# Patient Record
Sex: Female | Born: 1995 | Race: White | Hispanic: No | Marital: Single | Smoking: Current every day smoker
Health system: Southern US, Community
[De-identification: ages and names within clinical notes are randomized; demographics above are authoritative.]

## PROBLEM LIST (undated history)

## (undated) DIAGNOSIS — F32A Depression, unspecified: Secondary | ICD-10-CM

## (undated) DIAGNOSIS — F909 Attention-deficit hyperactivity disorder, unspecified type: Secondary | ICD-10-CM

## (undated) DIAGNOSIS — O1413 Severe pre-eclampsia, third trimester: Secondary | ICD-10-CM

## (undated) DIAGNOSIS — F329 Major depressive disorder, single episode, unspecified: Secondary | ICD-10-CM

## (undated) HISTORY — DX: Major depressive disorder, single episode, unspecified: F32.9

## (undated) HISTORY — DX: Depression, unspecified: F32.A

## (undated) HISTORY — DX: Severe pre-eclampsia, third trimester: O14.13

## (undated) HISTORY — PX: NO PAST SURGERIES: SHX2092

---
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
## 2017-01-15 ENCOUNTER — Encounter: Payer: Self-pay | Admitting: Certified Nurse Midwife

## 2017-01-15 ENCOUNTER — Encounter: Payer: Medicaid Other | Admitting: Certified Nurse Midwife

## 2017-01-15 ENCOUNTER — Ambulatory Visit (INDEPENDENT_AMBULATORY_CARE_PROVIDER_SITE_OTHER): Payer: Medicaid Other | Admitting: Certified Nurse Midwife

## 2017-01-15 VITALS — BP 130/80 | HR 112 | Ht 65.0 in | Wt 170.0 lb

## 2017-01-15 DIAGNOSIS — Z3169 Encounter for other general counseling and advice on procreation: Secondary | ICD-10-CM

## 2017-01-15 DIAGNOSIS — Z8759 Personal history of other complications of pregnancy, childbirth and the puerperium: Secondary | ICD-10-CM | POA: Diagnosis not present

## 2017-01-15 DIAGNOSIS — Z720 Tobacco use: Secondary | ICD-10-CM

## 2017-01-15 DIAGNOSIS — Z3202 Encounter for pregnancy test, result negative: Secondary | ICD-10-CM | POA: Diagnosis not present

## 2017-01-15 DIAGNOSIS — N926 Irregular menstruation, unspecified: Secondary | ICD-10-CM

## 2017-01-15 LAB — POCT URINE PREGNANCY: Preg Test, Ur: NEGATIVE

## 2017-01-15 MED ORDER — PROVIDA DHA 16-16-1.25-110 MG PO CAPS: 1.0000 | ORAL_CAPSULE | Freq: Every day | ORAL | 11 refills | Status: DC

## 2017-01-15 NOTE — Progress Notes (Signed)
Obstetrics & Gynecology Office Visit   Chief Complaint:  Chief Complaint  Patient presents with  . Follow-up    History of Present Illness: History of Present Illness: 21 year old G1 P1000 (baby died from SIDS at 52 weeks of age) presented initially because she had missed a menses in August and thought she may be pregnant. She had not done a UPT when missing her menses, just assumed she was pregnant and came into start prenatal care. She started a menses 01/11/2017 and it lasted 3 days. Her pregnancy test today was negative. She has not been taking vitamins or using birth control since her Nexplanon was removed in February 2018 for metrorrhagia. She is smoking 2-3 packs of cigarettes/day. She is currently being treated for a tooth abscess and ADHD. Her pediatrician is still listed as her PCP on her Medicaid card ([place] Access) and he has been prescribing her Concerta. She also complains of headaches. She has a history of migraine headaches since she was in elementary school.    She was seen 26 February with concerns for PPD and metrorrhagia on Nexplanon. The Nexplanon was inserted at [facility] after she had her baby in Sept 2017. She had HELLP/ severe preeclampsia.  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 She was given a RX for wellbutrin for the depression with hopes that it might also help her stop smoking, and encouraged to see a counselor at [facility] or hospice, but she never took this medicine or saw a Veterinary surgeon. She also has had an elevated alkaline phosphatase and ALT with some RUQ pain and she did not go for the abdominal ultrasound that was ordered to rule out gallbladder disease. I wanted to send her to GI, but that referral needs to go thru her PCP. I recommended that she change her PCp and follow up with that provider for further evaluation of all these problems, but she has not done that either.  She also uses marijuana.    Review of Systems:  Review of Systems    HENT:       Positive for Dental pain.  Genitourinary:       Positive for irregular menses  Neurological: Positive for headaches.  Psychiatric/Behavioral: Positive for depression.     Past Medical History:  Past Medical History:  Diagnosis Date  . ADHD (attention deficit hyperactivity disorder)   . Depression   . Marijuana use   . Preeclampsia, severe, third trimester    HELLP Syndrome  . Tobacco dependence     Past Surgical History:  Past Surgical History:  Procedure Laterality Date  . NO PAST SURGERIES      Gynecologic History: Patient's last menstrual period was 01/12/2017 (exact date).  Obstetric History:  OB History  Gravida Para Term Preterm AB Living  SAB TAB Ectopic Multiple Live Births          1    # Outcome Date GA Lbr Len/2nd Weight Sex Delivery Anes PTL Lv  1 Term 02/02/16 [redacted]w[redacted]d  7 lb 5 oz (3.317 kg) M   N DEC     Complications: Preeclampsia, severe, third trimester      Family History:  Family History  Problem Relation Age of Onset  . SIDS Son     Social History:  Social History   Social History  . Marital status: Single    Spouse name: N/A  .  Number of children: 0  . Years of education: N/A   Occupational History  . Not on file.   Social History Main Topics  . Smoking status: Current Every Day Smoker    Packs/day: 2.50    Types: Cigarettes  . Smokeless tobacco: Never Used  . Alcohol use Yes  . Drug use: Yes    Types: Marijuana  . Sexual activity: Yes    Birth control/ protection: None   Other Topics Concern  . Not on file   Social History Narrative  . No narrative on file    Allergies:  No Known Allergies  Medications:? Concerta, Amoxicillin  Physical Exam Vitals:BP 130/80   Pulse (!) 112   Ht 5\' 5"  (1.651 m)   Wt 170 lb (77.1 kg)   LMP 01/12/2017 (Exact Date)   BMI 28.29 kg/m  Patient's last menstrual period was 01/12/2017 (exact date).  Physical Exam  Constitutional: She is oriented to person,  place, and time. She appears well-developed and well-nourished. No distress.  Respiratory: Effort normal.  Neurological: She is alert and oriented to person, place, and time.  Psychiatric:  Answering questions appropriately Good eye contact Judgement impaired Behavior normal   ICON negative   Assessment: 21 y.o. G1P1000, presented for prenatal care, but is not pregnant HX of severe preeclampsia Hx of liver enzyme elevation Tobacco abuse Hx of SIDS in son   Plan: Preconceptual counseling. Recommended: 1) Taking prenatal vitamins. Given samples for [facility] and a RX x 1 year 2) Changing PCP on Medicaid card 3) Making appointment with PCP to discuss health concerns: headaches, liver enzyme elevation, tobacco abuse. 4) Tobacco cessation to decrease risks during pregnancy (preterm labor, IUGR, bleeding) and to decrease risk of SIDS. DID not want help earlier this year with Wellbutrin. Discussed help line: 1-800-Quit NOW.  Farrel Connersolleen Ranesha Val, CNM

## 2017-03-10 NOTE — Progress Notes (Signed)
This encounter was created in error - please disregard.

## 2017-03-29 ENCOUNTER — Other Ambulatory Visit: Payer: Self-pay

## 2017-03-29 DIAGNOSIS — O99331 Smoking (tobacco) complicating pregnancy, first trimester: Secondary | ICD-10-CM | POA: Diagnosis not present

## 2017-03-29 DIAGNOSIS — R1032 Left lower quadrant pain: Secondary | ICD-10-CM | POA: Insufficient documentation

## 2017-03-29 DIAGNOSIS — O2311 Infections of bladder in pregnancy, first trimester: Secondary | ICD-10-CM | POA: Insufficient documentation

## 2017-03-29 DIAGNOSIS — O9989 Other specified diseases and conditions complicating pregnancy, childbirth and the puerperium: Secondary | ICD-10-CM | POA: Diagnosis not present

## 2017-03-29 DIAGNOSIS — F17210 Nicotine dependence, cigarettes, uncomplicated: Secondary | ICD-10-CM | POA: Diagnosis not present

## 2017-03-29 DIAGNOSIS — Z3A01 Less than 8 weeks gestation of pregnancy: Secondary | ICD-10-CM | POA: Diagnosis not present

## 2017-03-29 DIAGNOSIS — Z79899 Other long term (current) drug therapy: Secondary | ICD-10-CM | POA: Diagnosis not present

## 2017-03-29 LAB — URINALYSIS, COMPLETE (UACMP) WITH MICROSCOPIC
Bilirubin Urine: NEGATIVE
Glucose, UA: NEGATIVE mg/dL
HGB URINE DIPSTICK: NEGATIVE
Ketones, ur: NEGATIVE mg/dL
NITRITE: NEGATIVE
PROTEIN: 30 mg/dL — AB
Specific Gravity, Urine: 1.031 — ABNORMAL HIGH (ref 1.005–1.030)
pH: 6 (ref 5.0–8.0)

## 2017-03-29 LAB — COMPREHENSIVE METABOLIC PANEL
ALBUMIN: 4.2 g/dL (ref 3.5–5.0)
ALT: 91 U/L — ABNORMAL HIGH (ref 14–54)
ANION GAP: 9 (ref 5–15)
AST: 58 U/L — AB (ref 15–41)
Alkaline Phosphatase: 155 U/L — ABNORMAL HIGH (ref 38–126)
BUN: 11 mg/dL (ref 6–20)
CHLORIDE: 100 mmol/L — AB (ref 101–111)
CO2: 23 mmol/L (ref 22–32)
Calcium: 9.5 mg/dL (ref 8.9–10.3)
Creatinine, Ser: 0.74 mg/dL (ref 0.44–1.00)
GFR calc Af Amer: >60 mL/min (ref >60)
GFR calc non Af Amer: >60 mL/min (ref >60)
GLUCOSE: 96 mg/dL (ref 65–99)
POTASSIUM: 3.4 mmol/L — AB (ref 3.5–5.1)
SODIUM: 132 mmol/L — AB (ref 135–145)
TOTAL PROTEIN: 7.8 g/dL (ref 6.5–8.1)
Total Bilirubin: 1 mg/dL (ref 0.3–1.2)

## 2017-03-29 LAB — CBC
HEMATOCRIT: 40 % (ref 35.0–47.0)
Hemoglobin: 13.8 g/dL (ref 12.0–16.0)
MCH: 32.2 pg (ref 26.0–34.0)
MCHC: 34.4 g/dL (ref 32.0–36.0)
MCV: 93.5 fL (ref 80.0–100.0)
Platelets: 271 10*3/uL (ref 150–440)
RBC: 4.27 MIL/uL (ref 3.80–5.20)
RDW: 12.7 % (ref 11.5–14.5)
WBC: 15 10*3/uL — ABNORMAL HIGH (ref 3.6–11.0)

## 2017-03-29 LAB — POCT PREGNANCY, URINE: PREG TEST UR: POSITIVE — AB

## 2017-03-29 NOTE — ED Triage Notes (Signed)
Pt reports lower abd pain and left flank pain for the past 3 days and now having decreased urination

## 2017-03-30 ENCOUNTER — Emergency Department: Payer: Medicaid Other

## 2017-03-30 ENCOUNTER — Emergency Department
Admission: EM | Admit: 2017-03-30 | Discharge: 2017-03-30 | Disposition: A | Discharge status: Home / Self Care | Payer: Medicaid Other | Attending: Emergency Medicine | Admitting: Emergency Medicine

## 2017-03-30 DIAGNOSIS — O2311 Infections of bladder in pregnancy, first trimester: Secondary | ICD-10-CM

## 2017-03-30 DIAGNOSIS — O26891 Other specified pregnancy related conditions, first trimester: Secondary | ICD-10-CM

## 2017-03-30 DIAGNOSIS — R109 Unspecified abdominal pain: Secondary | ICD-10-CM

## 2017-03-30 LAB — HCG, QUANTITATIVE, PREGNANCY: HCG, BETA CHAIN, QUANT, S: 213459 m[IU]/mL — AB (ref <5)

## 2017-03-30 MED ORDER — NITROFURANTOIN MACROCRYSTAL 100 MG PO CAPS
100.0000 mg | ORAL_CAPSULE | Freq: Four times a day (QID) | ORAL | 0 refills | Status: AC
Start: 2017-03-30 — End: 2017-04-06

## 2017-03-30 MED ORDER — SODIUM CHLORIDE 0.9 % IV BOLUS (SEPSIS)
1000.0000 mL | Freq: Once | INTRAVENOUS | Status: AC
  Administered 2017-03-30: 1000 mL via INTRAVENOUS

## 2017-03-30 MED ORDER — METOCLOPRAMIDE HCL 10 MG PO TABS: 10.0000 mg | ORAL_TABLET | Freq: Three times a day (TID) | ORAL | 0 refills | Status: DC | PRN

## 2017-03-30 NOTE — ED Notes (Signed)
Pt ambulatory with steady gait to have vital signs rechecked; pt reports left mid back pain continues; updated on the wait time/delay; verbalized understanding; pt still with elevated HR, 114; will speak with provider about further orders;

## 2017-03-30 NOTE — ED Notes (Signed)
Spoke with Dr Dolores FrameSung about pt's presenting symptoms and elevated HR; verbal orders obtained

## 2017-03-30 NOTE — Discharge Instructions (Signed)
Please follow up with OB/GYN.

## 2017-03-30 NOTE — ED Provider Notes (Signed)
[facility] Emergency Department Provider Note   ____________________________________________   First MD Initiated Contact with Patient 03/30/17 0216     (approximate)  I have reviewed the triage vital signs and the nursing notes.   HISTORY  Chief Complaint Flank Pain    HPI Susan [name] is a 21 y.o. female who comes into the hospital today because she thought she had a UTI.  She has had some pain in her lower abdomen and in her left lower back.  She is also noticed some orange colored urine.  She has had the symptoms for the past 3 days.  The patient states that she came in tonight because she was so uncomfortable and she could hardly urinate.  The patient states that she took Tylenol today.  She has had some nausea and vomiting as well.  It has been going on for a while.  The patient states that her last menstrual period was a month ago.  She took a pregnancy test yesterday and it was positive.  She denies any vaginal bleeding.  She is a G2 P1 000 as she lost her first child to SIDS.  Patient states that her pain is a 7-8 out of 10 in intensity currently.  She is here for evaluation.   Past Medical History:  Diagnosis Date  . ADHD (attention deficit hyperactivity disorder)   . Depression   . Marijuana use   . Preeclampsia, severe, third trimester    HELLP Syndrome  . Tobacco dependence     Patient Active Problem List   Diagnosis Date Noted  . Tobacco abuse 12/07/2016  . Marijuana abuse 12/07/2016  . Abnormal liver function tests 09/18/2016  . HELLP (hemolytic anemia/elev liver enzymes/low platelets in pregnancy) 09/18/2016  . Chronic headaches 09/18/2016    Past Surgical History:  Procedure Laterality Date  . NO PAST SURGERIES      Prior to Admission medications   Medication Sig Start Date End Date Taking? Authorizing Provider  metoCLOPramide (REGLAN) 10 MG tablet Take 1 tablet (10 mg total) every 8 (eight) hours as needed by mouth.  03/30/17   Rebecka Apley, MD  nitrofurantoin (MACRODANTIN) 100 MG capsule Take 1 capsule (100 mg total) 4 (four) times daily for 7 days by mouth. 03/30/17 04/06/17  Rebecka Apley, MD  Prenat-FeFum-FePo-FA-DHA w/o A (PROVIDA DHA) 16-16-1.25-110 MG CAPS Take 1 capsule by mouth daily. 01/15/17   Farrel Conners, CNM    Allergies Patient has no known allergies.  Family History  Problem Relation Age of Onset  . SIDS Son     Social History Social History   Tobacco Use  . Smoking status: Current Every Day Smoker    Packs/day: 2.50    Types: Cigarettes  . Smokeless tobacco: Never Used  Substance Use Topics  . Alcohol use: Yes  . Drug use: Yes    Types: Marijuana    Review of Systems  Constitutional: No fever/chills Eyes: No visual changes. ENT: No sore throat. Cardiovascular: Denies chest pain. Respiratory: Denies shortness of breath. Gastrointestinal: abdominal pain.   nausea, vomiting.  No diarrhea.  No constipation. Genitourinary:  dysuria. Musculoskeletal:  back pain. Skin: Negative for rash. Neurological: Negative for headaches, focal weakness or numbness.   ____________________________________________   PHYSICAL EXAM:  VITAL SIGNS: ED Triage Vitals  Enc Vitals Group     BP 03/29/17 2153 (!) 161/69     Pulse Rate 03/29/17 2153 (!) 120     Resp 03/29/17 2153 17  Temp 03/29/17 2153 98.1 F (36.7 C)     Temp Source 03/29/17 2153 Oral     SpO2 03/29/17 2153 98 %     Weight 03/29/17 2156 185 lb (83.9 kg)     Height 03/29/17 2156 5\' 6"  (1.676 m)     Head Circumference --      Peak Flow --      Pain Score 03/29/17 2201 7     Pain Loc --      Pain Edu? --      Excl. in GC? --     Constitutional: Alert and oriented. Well appearing and in mild distress. Eyes: Conjunctivae are normal. PERRL. EOMI. Head: Atraumatic. Nose: No congestion/rhinnorhea. Mouth/Throat: Mucous membranes are moist.  Oropharynx non-erythematous. ardiovascular: Normal rate,  regular rhythm. Grossly normal heart sounds.  Good peripheral circulation. Respiratory: Normal respiratory effort.  No retractions. Lungs CTAB. Gastrointestinal: Soft and nontender. No distention.  Positive bowel sounds no distinct CVA tenderness to palpation Genitourinary: patient declined Musculoskeletal: No lower extremity tenderness nor edema.   Neurologic:  Normal speech and language.  Skin:  Skin is warm, dry and intact. Psychiatric: Mood and affect are normal.  ____________________________________________   LABS (all labs ordered are listed, but only abnormal results are displayed)  Labs Reviewed  COMPREHENSIVE METABOLIC PANEL - Abnormal; Notable for the following components:      Result Value   Sodium 132 (*)    Potassium 3.4 (*)    Chloride 100 (*)    AST 58 (*)    ALT 91 (*)    Alkaline Phosphatase 155 (*)    All other components within normal limits  CBC - Abnormal; Notable for the following components:   WBC 15.0 (*)    All other components within normal limits  URINALYSIS, COMPLETE (UACMP) WITH MICROSCOPIC - Abnormal; Notable for the following components:   Color, Urine YELLOW (*)    APPearance HAZY (*)    Specific Gravity, Urine 1.031 (*)    Protein, ur 30 (*)    Leukocytes, UA SMALL (*)    Bacteria, UA RARE (*)    Squamous Epithelial / LPF 6-30 (*)    All other components within normal limits  HCG, QUANTITATIVE, PREGNANCY - Abnormal; Notable for the following components:   hCG, Beta Chain, Quant, S 213,459 (*)    All other components within normal limits  POCT PREGNANCY, URINE - Abnormal; Notable for the following components:   Preg Test, Ur POSITIVE (*)    All other components within normal limits  POC URINE PREG, ED   ____________________________________________  EKG  none ____________________________________________  RADIOLOGY  [place] Ob Comp Less 14 Wks  Result Date: 03/30/2017 CLINICAL DATA:  Abdominal pain for 3 days. Left flank pain. Estimated  gestational age by LMP is 5 weeks 0 days. Quantitative beta HCG is 213,459 EXAM: OBSTETRIC <14 WK US AND TRANSVAGINAL OB US TECHNIQUE: Both transabdominal and transvaginal ultrasound examinations were performed for complete evaluation of the gestation as well as the maternal uterus, adnexal regions, and pelvic cul-de-sac. Transvaginal technique was performed to assess early pregnancy. COMPARISON:  None. FINDINGS: Intrauterine gestational sac: A single intrauterine gestational sac is present. Yolk sac:  Yolk sac is present. Embryo:  Fetal pole is present. Cardiac Activity: Fetal cardiac activity is observed. Heart Rate: 168  bpm CRL:  18  mm   8 w   2 d                  [place]  EDC: 11/07/2017 Subchorionic hemorrhage:  None visualized. Maternal uterus/adnexae: Uterus is anteverted. No myometrial mass lesions identified. Both ovaries are visualized and appear normal. No abnormal adnexal masses. No abnormal pelvic fluid collections. IMPRESSION: Single intrauterine pregnancy. Estimated gestational age by crown-rump length is 8 weeks 2 days. No acute complication demonstrated sonographically. Electronically Signed   By: Burman Nieves M.D.   On: 03/30/2017 04:01   US Ob Transvaginal  Result Date: 03/30/2017 CLINICAL DATA:  Abdominal pain for 3 days. Left flank pain. Estimated gestational age by LMP is 5 weeks 0 days. Quantitative beta HCG is 213,459 EXAM: OBSTETRIC <14 WK [place] AND TRANSVAGINAL OB US TECHNIQUE: Both transabdominal and transvaginal ultrasound examinations were performed for complete evaluation of the gestation as well as the maternal uterus, adnexal regions, and pelvic cul-de-sac. Transvaginal technique was performed to assess early pregnancy. COMPARISON:  None. FINDINGS: Intrauterine gestational sac: A single intrauterine gestational sac is present. Yolk sac:  Yolk sac is present. Embryo:  Fetal pole is present. Cardiac Activity: Fetal cardiac activity is observed. Heart Rate: 168  bpm CRL:  18  mm   8 w    2 d                  [place] EDC: 11/07/2017 Subchorionic hemorrhage:  None visualized. Maternal uterus/adnexae: Uterus is anteverted. No myometrial mass lesions identified. Both ovaries are visualized and appear normal. No abnormal adnexal masses. No abnormal pelvic fluid collections. IMPRESSION: Single intrauterine pregnancy. Estimated gestational age by crown-rump length is 8 weeks 2 days. No acute complication demonstrated sonographically. Electronically Signed   By: Burman Nieves M.D.   On: 03/30/2017 04:01   US Renal  Result Date: 03/30/2017 CLINICAL DATA:  21 year old female with left flank pain x3 days. Positive pregnancy test. EXAM: RENAL / URINARY TRACT ULTRASOUND COMPLETE COMPARISON:  None. FINDINGS: Right Kidney: Length: 11.4 cm. Echogenicity within normal limits. No hydronephrosis or echogenic stone. Left Kidney: Length: 10.0 cm. Echogenicity within normal limits. No hydronephrosis or echogenic stone. Bladder: Appears normal for degree of bladder distention. Partially visualized sac within the uterus. Please see report for the pelvic ultrasound. IMPRESSION: Unremarkable renal ultrasound. Electronically Signed   By: Elgie Collard M.D.   On: 03/30/2017 04:03    ____________________________________________   PROCEDURES  Procedure(s) performed: None  Procedures  Critical Care performed: No  ____________________________________________   INITIAL IMPRESSION / ASSESSMENT AND PLAN / ED COURSE  As part of my medical decision making, I reviewed the following data within the electronic MEDICAL RECORD NUMBER Notes from prior ED visits and [place] Controlled Substance Database   This is a 21 year old female who comes into the hospital today with some urinary symptoms.  She has had some pain with urination as well as some dark urine lower abdominal pain and left flank pain.  Patient also reports that she discovered that she was pregnant yesterday.  The patient declined a pelvic exam but we did  send her for an ultrasound.  The patient's ultrasound shows a single intrauterine pregnancy that is about 8 weeks and 2 days at gestational age.  She does not have an ectopic pregnancy.  The patient's blood work is also unremarkable but she does have some bacteria in her urine and some dark appearing urine.  The patient also had a renal ultrasound which did not show any stones given her unilateral pain.  I did give the patient 2 L of normal saline and I will treat her with some nitrofurantoin for her UTI.  She also has some elevation of her liver enzymes but she does need to follow-up with OB/GYN for further evaluation of all of her symptoms.  The patient otherwise has no complaints.  She is feeling well and will be discharged home to follow-up.  She was eating some chips room without any vomiting.      ____________________________________________   FINAL CLINICAL IMPRESSION(S) / ED DIAGNOSES  Final diagnoses:  Left flank pain  Bladder infection in mother during first trimester of pregnancy  Abdominal pain during pregnancy in first trimester     ED Discharge Orders        Ordered    nitrofurantoin (MACRODANTIN) 100 MG capsule  4 times daily     03/30/17 0540    metoCLOPramide (REGLAN) 10 MG tablet  Every 8 hours PRN     03/30/17 0540       Note:  This document was prepared using Dragon voice recognition software and may include unintentional dictation errors.    Rebecka ApleyWebster, Allison P, MD 03/30/17 [phone]

## 2017-04-06 ENCOUNTER — Ambulatory Visit (INDEPENDENT_AMBULATORY_CARE_PROVIDER_SITE_OTHER): Payer: Medicaid Other | Admitting: Obstetrics & Gynecology

## 2017-04-06 ENCOUNTER — Encounter: Payer: Self-pay | Admitting: Obstetrics & Gynecology

## 2017-04-06 ENCOUNTER — Telehealth: Payer: Self-pay

## 2017-04-06 VITALS — BP 120/80 | Wt 172.0 lb

## 2017-04-06 DIAGNOSIS — Z8759 Personal history of other complications of pregnancy, childbirth and the puerperium: Secondary | ICD-10-CM | POA: Insufficient documentation

## 2017-04-06 DIAGNOSIS — Z3490 Encounter for supervision of normal pregnancy, unspecified, unspecified trimester: Secondary | ICD-10-CM

## 2017-04-06 DIAGNOSIS — O0990 Supervision of high risk pregnancy, unspecified, unspecified trimester: Secondary | ICD-10-CM | POA: Insufficient documentation

## 2017-04-06 DIAGNOSIS — F1210 Cannabis abuse, uncomplicated: Secondary | ICD-10-CM

## 2017-04-06 DIAGNOSIS — Z3A09 9 weeks gestation of pregnancy: Secondary | ICD-10-CM

## 2017-04-06 NOTE — Patient Instructions (Signed)
First Trimester of Pregnancy The first trimester of pregnancy is from week 1 until the end of week 13 (months 1 through 3). A week after a sperm fertilizes an egg, the egg will implant on the wall of the uterus. This embryo will begin to develop into a baby. Genes from you and your partner will form the baby. The female genes will determine whether the baby will be a boy or a girl. At 6-8 weeks, the eyes and face will be formed, and the heartbeat can be seen on ultrasound. At the end of 12 weeks, all the baby's organs will be formed. Now that you are pregnant, you will want to do everything you can to have a healthy baby. Two of the most important things are to get good prenatal care and to follow your health care provider's instructions. Prenatal care is all the medical care you receive before the baby's birth. This care will help prevent, find, and treat any problems during the pregnancy and childbirth. Body changes during your first trimester Your body goes through many changes during pregnancy. The changes vary from woman to woman.  You may gain or lose a couple of pounds at first.  You may feel sick to your stomach (nauseous) and you may throw up (vomit). If the vomiting is uncontrollable, call your health care provider.  You may tire easily.  You may develop headaches that can be relieved by medicines. All medicines should be approved by your health care provider.  You may urinate more often. Painful urination may mean you have a bladder infection.  You may develop heartburn as a result of your pregnancy.  You may develop constipation because certain hormones are causing the muscles that push stool through your intestines to slow down.  You may develop hemorrhoids or swollen veins (varicose veins).  Your breasts may begin to grow larger and become tender. Your nipples may stick out more, and the tissue that surrounds them (areola) may become darker.  Your gums may bleed and may be  sensitive to brushing and flossing.  Dark spots or blotches (chloasma, mask of pregnancy) may develop on your face. This will likely fade after the baby is born.  Your menstrual periods will stop.  You may have a loss of appetite.  You may develop cravings for certain kinds of food.  You may have changes in your emotions from day to day, such as being excited to be pregnant or being concerned that something may go wrong with the pregnancy and baby.  You may have more vivid and strange dreams.  You may have changes in your hair. These can include thickening of your hair, rapid growth, and changes in texture. Some women also have hair loss during or after pregnancy, or hair that feels dry or thin. Your hair will most likely return to normal after your baby is born.  What to expect at prenatal visits During a routine prenatal visit:  You will be weighed to make sure you and the baby are growing normally.  Your blood pressure will be taken.  Your abdomen will be measured to track your baby's growth.  The fetal heartbeat will be listened to between weeks 10 and 14 of your pregnancy.  Test results from any previous visits will be discussed.  Your health care provider may ask you:  How you are feeling.  If you are feeling the baby move.  If you have had any abnormal symptoms, such as leaking fluid, bleeding, severe headaches,   or abdominal cramping.  If you are using any tobacco products, including cigarettes, chewing tobacco, and electronic cigarettes.  If you have any questions.  Other tests that may be performed during your first trimester include:  Blood tests to find your blood type and to check for the presence of any previous infections. The tests will also be used to check for low iron levels (anemia) and protein on red blood cells (Rh antibodies). Depending on your risk factors, or if you previously had diabetes during pregnancy, you may have tests to check for high blood  sugar that affects pregnant women (gestational diabetes).  Urine tests to check for infections, diabetes, or protein in the urine.  An ultrasound to confirm the proper growth and development of the baby.  Fetal screens for spinal cord problems (spina bifida) and Down syndrome.  HIV (human immunodeficiency virus) testing. Routine prenatal testing includes screening for HIV, unless you choose not to have this test.  You may need other tests to make sure you and the baby are doing well.  Follow these instructions at home: Medicines  Follow your health care provider's instructions regarding medicine use. Specific medicines may be either safe or unsafe to take during pregnancy.  Take a prenatal vitamin that contains at least 600 micrograms (mcg) of folic acid.  If you develop constipation, try taking a stool softener if your health care provider approves. Eating and drinking  Eat a balanced diet that includes fresh fruits and vegetables, whole grains, good sources of protein such as meat, eggs, or tofu, and low-fat dairy. Your health care provider will help you determine the amount of weight gain that is right for you.  Avoid raw meat and uncooked cheese. These carry germs that can cause birth defects in the baby.  Eating four or five small meals rather than three large meals a day may help relieve nausea and vomiting. If you start to feel nauseous, eating a few soda crackers can be helpful. Drinking liquids between meals, instead of during meals, also seems to help ease nausea and vomiting.  Limit foods that are high in fat and processed sugars, such as fried and sweet foods.  To prevent constipation: ? Eat foods that are high in fiber, such as fresh fruits and vegetables, whole grains, and beans. ? Drink enough fluid to keep your urine clear or pale yellow. Activity  Exercise only as directed by your health care provider. Most women can continue their usual exercise routine during  pregnancy. Try to exercise for 30 minutes at least 5 days a week. Exercising will help you: ? Control your weight. ? Stay in shape. ? Be prepared for labor and delivery.  Experiencing pain or cramping in the lower abdomen or lower back is a good sign that you should stop exercising. Check with your health care provider before continuing with normal exercises.  Try to avoid standing for long periods of time. Move your legs often if you must stand in one place for a long time.  Avoid heavy lifting.  Wear low-heeled shoes and practice good posture.  You may continue to have sex unless your health care provider tells you not to. Relieving pain and discomfort  Wear a good support bra to relieve breast tenderness.  Take warm sitz baths to soothe any pain or discomfort caused by hemorrhoids. Use hemorrhoid cream if your health care provider approves.  Rest with your legs elevated if you have leg cramps or low back pain.  If you develop   varicose veins in your legs, wear support hose. Elevate your feet for 15 minutes, 3-4 times a day. Limit salt in your diet. Prenatal care  Schedule your prenatal visits by the twelfth week of pregnancy. They are usually scheduled monthly at first, then more often in the last 2 months before delivery.  Write down your questions. Take them to your prenatal visits.  Keep all your prenatal visits as told by your health care provider. This is important. Safety  Wear your seat belt at all times when driving.  Make a list of emergency phone numbers, including numbers for family, friends, the hospital, and police and fire departments. General instructions  Ask your health care provider for a referral to a local prenatal education class. Begin classes no later than the beginning of month 6 of your pregnancy.  Ask for help if you have counseling or nutritional needs during pregnancy. Your health care provider can offer advice or refer you to specialists for help  with various needs.  Do not use hot tubs, steam rooms, or saunas.  Do not douche or use tampons or scented sanitary pads.  Do not cross your legs for long periods of time.  Avoid cat litter boxes and soil used by cats. These carry germs that can cause birth defects in the baby and possibly loss of the fetus by miscarriage or stillbirth.  Avoid all smoking, herbs, alcohol, and medicines not prescribed by your health care provider. Chemicals in these products affect the formation and growth of the baby.  Do not use any products that contain nicotine or tobacco, such as cigarettes and e-cigarettes. If you need help quitting, ask your health care provider. You may receive counseling support and other resources to help you quit.  Schedule a dentist appointment. At home, brush your teeth with a soft toothbrush and be gentle when you floss. Contact a health care provider if:  You have dizziness.  You have mild pelvic cramps, pelvic pressure, or nagging pain in the abdominal area.  You have persistent nausea, vomiting, or diarrhea.  You have a bad smelling vaginal discharge.  You have pain when you urinate.  You notice increased swelling in your face, hands, legs, or ankles.  You are exposed to fifth disease or chickenpox.  You are exposed to German measles (rubella) and have never had it. Get help right away if:  You have a fever.  You are leaking fluid from your vagina.  You have spotting or bleeding from your vagina.  You have severe abdominal cramping or pain.  You have rapid weight gain or loss.  You vomit blood or material that looks like coffee grounds.  You develop a severe headache.  You have shortness of breath.  You have any kind of trauma, such as from a fall or a car accident. Summary  The first trimester of pregnancy is from week 1 until the end of week 13 (months 1 through 3).  Your body goes through many changes during pregnancy. The changes vary from  woman to woman.  You will have routine prenatal visits. During those visits, your health care provider will examine you, discuss any test results you may have, and talk with you about how you are feeling. This information is not intended to replace advice given to you by your health care provider. Make sure you discuss any questions you have with your health care provider. Document Released: 04/22/2001 Document Revised: 04/09/2016 Document Reviewed: 04/09/2016 [employer] Interactive Patient Education  2017 [employer]  

## 2017-04-06 NOTE — Progress Notes (Signed)
04/06/2017   Chief Complaint: Missed period  Transfer of Care Patient: no  History of Present Illness: Susan [name] is a 21 y.o. G2P1000 4415w1d based on Patient's last menstrual period was 02/01/2017. with an Estimated Date of Delivery: 11/08/17, with the above CC.   Her periods were: regular periods every 28 days She was using no method when she conceived.  She has Positive signs or symptoms of nausea/vomiting of pregnancy. She has Negative signs or symptoms of miscarriage or preterm labor She identifies Negative Zika risk factors for her and her partner On any different medications around the time she conceived/early pregnancy: No  History of varicella: Yes   ROS: A 12-point review of systems was performed and negative, except as stated in the above HPI.  OBGYN History: As per HPI. OB History  Gravida Para Term Preterm AB Living  2 1 1         SAB TAB Ectopic Multiple Live Births          1    # Outcome Date GA Lbr Len/2nd Weight Sex Delivery Anes PTL Lv  2 Current           1 Term 02/02/16 7142w1d  7 lb 5 oz (3.317 kg) M   N DEC     Complications: Preeclampsia, severe, third trimester      Any issues with any prior pregnancies: HELLP at 40 weeks, IOL, NSVD, later died of SIDS at 7 weeks Any prior children are healthy, doing well, without any problems or issues: no History of pap smears: No. Last pap smear na. Abnormal: not applicable   History of STIs: No   Past Medical History: Past Medical History:  Diagnosis Date  . ADHD (attention deficit hyperactivity disorder)   . Depression   . Marijuana use   . Preeclampsia, severe, third trimester    HELLP Syndrome  . Tobacco dependence     Past Surgical History: Past Surgical History:  Procedure Laterality Date  . NO PAST SURGERIES      Family History:  Family History  Problem Relation Age of Onset  . SIDS Son    She denies any female cancers, bleeding or blood clotting disorders.  She denies any history of mental  retardation, birth defects or genetic disorders in her or the FOB's history  Social History:  Social History   Socioeconomic History  . Marital status: Single    Spouse name: Not on file  . Number of children: 0  . Years of education: Not on file  . Highest education level: Not on file  Social Needs  . Financial resource strain: Not on file  . Food insecurity - worry: Not on file  . Food insecurity - inability: Not on file  . Transportation needs - medical: Not on file  . Transportation needs - non-medical: Not on file  Occupational History  . Not on file  Tobacco Use  . Smoking status: Current Every Day Smoker    Packs/day: 2.50    Types: Cigarettes  . Smokeless tobacco: Never Used  Substance and Sexual Activity  . Alcohol use: Yes  . Drug use: Yes    Types: Marijuana  . Sexual activity: Yes    Birth control/protection: None  Other Topics Concern  . Not on file  Social History Narrative  . Not on file   Any pets in the household: no  Allergy: No Known Allergies  Current Outpatient Medications:  Current Outpatient Medications:  .  metoCLOPramide (REGLAN) 10 MG  tablet, Take 1 tablet (10 mg total) every 8 (eight) hours as needed by mouth., Disp: 20 tablet, Rfl: 0 .  nitrofurantoin (MACRODANTIN) 100 MG capsule, Take 1 capsule (100 mg total) 4 (four) times daily for 7 days by mouth., Disp: 28 capsule, Rfl: 0 .  Prenat-FeFum-FePo-FA-DHA w/o A (PROVIDA DHA) 16-16-1.25-110 MG CAPS, Take 1 capsule by mouth daily., Disp: 30 capsule, Rfl: 11   Physical Exam:   BP 120/80   Wt 172 lb (78 kg)   LMP 02/01/2017 Comment: about a month ago.  has no idea.  home preg tested positive  BMI 27.76 kg/m  Body mass index is 27.76 kg/m. Constitutional: Well nourished, well developed female in no acute distress.  Neck:  Supple, normal appearance, and no thyromegaly  Cardiovascular: S1, S2 normal, no murmur, rub or gallop, regular rate and rhythm Respiratory:  Clear to auscultation  bilateral. Normal respiratory effort Abdomen: positive bowel sounds and no masses, hernias; diffusely non tender to palpation, non distended Breasts: breasts appear normal, no suspicious masses, no skin or nipple changes or axillary nodes. Neuro/Psych:  Normal mood and affect.  Skin:  Warm and dry.  Lymphatic:  No inguinal lymphadenopathy.   Pelvic exam: is not limited by body habitus EGBUS: within normal limits, Vagina: within normal limits and with no blood in the vault, Cervix: normal appearing cervix without discharge or lesions, closed/long/high, Uterus:  enlarged: 8 weeks, and Adnexa:  not evaluated  Assessment: Susan [name] is a 21 y.o. G2P1000 5869w1d based on Patient's last menstrual period was 02/01/2017. with an Estimated Date of Delivery: 11/08/17,  for prenatal care.  Plan:  1) Avoid alcoholic beverages. 2) Patient encouraged not to smoke.  3) Discontinue the use of all non-medicinal drugs and chemicals.  4) Take prenatal vitamins daily.  5) Seatbelt use advised 6) Nutrition, food safety (fish, cheese advisories, and high nitrite foods) and exercise discussed. 7) Hospital and [facility] discussed  8) Patient is asked about travel to areas at risk for the Zika virus, and counseled to avoid travel and exposure to mosquitoes or sexual partners who may have themselves been exposed to the virus. Testing is discussed, and will be ordered as appropriate.  9) [facility]birth [facility] advised 10) Genetic Screening, such as with 1st Trimester Screening, cell free fetal DNA, AFP testing, and Ultrasound, as well as with amniocentesis and CVS as appropriate, is discussed with patient. She plans to have not genetic testing this pregnancy. 11) Baseline PREECLAMPSIA labs done. 12) uDS 13) Sudafed for cold like sx's  Problem list reviewed and updated.  Annamarie MajorPaul Valdez Brannan, MD, Merlinda FrederickFACOG [facility], [facility] 04/06/2017  9:36 AM

## 2017-04-06 NOTE — Telephone Encounter (Signed)
Pt seen this a.m. Since then they have found out that there is head lice in the home. Inquiring what is safe for her to use. Does she need a rx? If so, she doesn't have a pcp. JX#[id number]

## 2017-04-06 NOTE — Telephone Encounter (Signed)
Spoke w/[name]. Notified per protocol that OTC Nix is safe to use. [name] states Misty Stanleythey do not have the funds to purchase. She has medicaid & is requesting [facility] rx [facility] [address]

## 2017-04-07 ENCOUNTER — Other Ambulatory Visit: Payer: Self-pay | Admitting: Obstetrics and Gynecology

## 2017-04-07 LAB — CMP AND LIVER
ALT: 42 IU/L — AB (ref 0–32)
AST: 24 IU/L (ref 0–40)
Albumin: 4.3 g/dL (ref 3.5–5.5)
Alkaline Phosphatase: 160 IU/L — ABNORMAL HIGH (ref 39–117)
BILIRUBIN, DIRECT: 0.08 mg/dL (ref 0.00–0.40)
BUN: 9 mg/dL (ref 6–20)
Bilirubin Total: <0.2 mg/dL (ref 0.0–1.2)
CALCIUM: 9.6 mg/dL (ref 8.7–10.2)
CO2: 20 mmol/L (ref 20–29)
Chloride: 102 mmol/L (ref 96–106)
Creatinine, Ser: 0.62 mg/dL (ref 0.57–1.00)
GFR, EST AFRICAN AMERICAN: 151 mL/min/{1.73_m2} (ref >59)
GFR, EST NON AFRICAN AMERICAN: 131 mL/min/{1.73_m2} (ref >59)
GLUCOSE: 91 mg/dL (ref 65–99)
Potassium: 4.1 mmol/L (ref 3.5–5.2)
Sodium: 137 mmol/L (ref 134–144)
Total Protein: 6.8 g/dL (ref 6.0–8.5)

## 2017-04-07 LAB — RPR+RH+ABO+RUB AB+AB SCR+CB...
ANTIBODY SCREEN: NEGATIVE
HEMATOCRIT: 38.9 % (ref 34.0–46.6)
HIV Screen 4th Generation wRfx: NONREACTIVE
Hemoglobin: 12.4 g/dL (ref 11.1–15.9)
Hepatitis B Surface Ag: NEGATIVE
MCH: 30.7 pg (ref 26.6–33.0)
MCHC: 31.9 g/dL (ref 31.5–35.7)
MCV: 96 fL (ref 79–97)
Platelets: 282 10*3/uL (ref 150–379)
RBC: 4.04 x10E6/uL (ref 3.77–5.28)
RDW: 13.5 % (ref 12.3–15.4)
RH TYPE: POSITIVE
RPR Ser Ql: NONREACTIVE
Rubella Antibodies, IGG: 1.59 index (ref >0.99)
Varicella zoster IgG: <135 index — ABNORMAL LOW (ref >165)
WBC: 13.8 10*3/uL — ABNORMAL HIGH (ref 3.4–10.8)

## 2017-04-07 LAB — PROTEIN / CREATININE RATIO, URINE
Creatinine, Urine: 84 mg/dL
PROTEIN UR: 7.5 mg/dL
PROTEIN/CREAT RATIO: 89 mg/g{creat} (ref 0–200)

## 2017-04-07 MED ORDER — PERMETHRIN 1 % EX LIQD: Freq: Once | CUTANEOUS | 0 refills | Status: DC

## 2017-04-07 NOTE — Telephone Encounter (Signed)
That is pregnancy category C the active ingredient in Nix is B so she really needs to find someway to come up with the money for it

## 2017-04-07 NOTE — Telephone Encounter (Signed)
[facility]

## 2017-04-07 NOTE — Telephone Encounter (Signed)
Susan [name] states that [employer] is covered by [employer]. That is what the pediatrician has sent in for her grandchildren. Requesting this to be sent in.

## 2017-04-07 NOTE — Telephone Encounter (Signed)
Notified [name]. She will p/u the Nix that was sent in.

## 2017-04-08 LAB — DRUG SCREEN, URINE
AMPHETAMINES, URINE: NEGATIVE ng/mL
BARBITURATE SCREEN URINE: NEGATIVE ng/mL
BENZODIAZEPINE QUANT UR: NEGATIVE ng/mL
Cannabinoid Quant, Ur: POSITIVE ng/mL — AB
Cocaine (Metab.): NEGATIVE ng/mL
Opiate Quant, Ur: NEGATIVE ng/mL
PCP Quant, Ur: NEGATIVE ng/mL

## 2017-04-08 LAB — GC/CHLAMYDIA PROBE AMP
CHLAMYDIA, DNA PROBE: NEGATIVE
NEISSERIA GONORRHOEAE BY PCR: NEGATIVE

## 2017-04-08 LAB — URINE CULTURE

## 2017-05-01 ENCOUNTER — Ambulatory Visit (INDEPENDENT_AMBULATORY_CARE_PROVIDER_SITE_OTHER): Payer: Medicaid Other | Admitting: Certified Nurse Midwife

## 2017-05-01 VITALS — BP 110/70 | Wt 176.0 lb

## 2017-05-01 DIAGNOSIS — R7989 Other specified abnormal findings of blood chemistry: Secondary | ICD-10-CM

## 2017-05-01 DIAGNOSIS — Z720 Tobacco use: Secondary | ICD-10-CM

## 2017-05-01 DIAGNOSIS — Z3A12 12 weeks gestation of pregnancy: Secondary | ICD-10-CM

## 2017-05-01 DIAGNOSIS — F1210 Cannabis abuse, uncomplicated: Secondary | ICD-10-CM

## 2017-05-01 DIAGNOSIS — O0990 Supervision of high risk pregnancy, unspecified, unspecified trimester: Secondary | ICD-10-CM

## 2017-05-01 DIAGNOSIS — R945 Abnormal results of liver function studies: Secondary | ICD-10-CM

## 2017-05-01 DIAGNOSIS — Z8482 Family history of sudden infant death syndrome: Secondary | ICD-10-CM

## 2017-05-01 MED ORDER — CEPHALEXIN 500 MG PO CAPS: 500.0000 mg | ORAL_CAPSULE | Freq: Two times a day (BID) | ORAL | 0 refills | Status: DC

## 2017-05-01 MED ORDER — PROVIDA OB 20-20-1.25 MG PO CAPS: 1.0000 | ORAL_CAPSULE | Freq: Every day | ORAL | 11 refills | Status: AC

## 2017-05-01 MED ORDER — DOXYLAMINE-PYRIDOXINE 10-10 MG PO TBEC: 2.0000 | DELAYED_RELEASE_TABLET | Freq: Every day | ORAL | 3 refills | Status: DC

## 2017-05-01 NOTE — Progress Notes (Signed)
Pt reports no problems.   

## 2017-05-01 NOTE — Progress Notes (Signed)
HROB at 12wk5d-history of severe preeclampsia/ HELLP/ first baby died of SIDS Smoking MJ and tobacco. States has cut down smoking: smokes MJ in AM to help with nausea and smokes 2-3 cigs/day Discussed possible effects of MJ and tobacco on fetal growth and development and recommended stopping both RX for Diclegis called into pharmacy.  Patient also complains of a sore boil on her mons/right panty line 3 cm boil present-not draining-may have started as a folliculitis (shaved mons) RX for Keflex 500 mgm BID/ recommend warm soaks BID Reviewed NOB labs-LFTs improved, ALT only slightly elevated-can repeat at 28 weeks. Encouraged hydration. Drinks 2L of caffeinated soda a day and no water Recommended slowly decreasing caffeine and increasing water Declines genetic testing ROB in 4 weeks

## 2017-05-06 ENCOUNTER — Encounter: Payer: Medicaid Other | Admitting: Obstetrics and Gynecology

## 2017-05-12 NOTE — L&D Delivery Note (Signed)
Delivery Note Primary OB: [facility] Provider: Marcelyn BruinsJacelyn Timothey Dahlstrom, CNM Gestational Age: Full term Antepartum complications: none Intrapartum complications: None  A viable Female was delivered via vertex presentation. No nuchal cord was present. Delivery of the shoulders and body followed without difficulty. A body cord was present. The infant was placed on the maternal abdomen. The umbilical cord was doubly clamped and cut following delayed cord clamping. Cord blood was collected. The placenta was delivered spontaneously and was inspected and found to be intact with a three vessel cord.  IM Pitocin was given because IV access was lost during the third stage. The cervix and vagina were inspected. A first degree perineal laceration was repaired with 3-0 Vicryl. The fundus was firm. Patient and infant were bonding in stable condition. All counts were correct.  Apgars:8, 9  Weight:  7 lb 8 oz.   Placenta status: spontaneous and intact.    Anesthesia:  nitrous oxide Episiotomy:  none Lacerations:  1st Suture Repair: 3.0 Vicryl Est. Blood Loss (mL):  300 mL  Mom to postpartum.  Baby to Couplet care / Skin to Skin.  Marcelyn BruinsJacelyn Tonica Brasington, CNM [facility], [facility] 11/03/2017  9:22 AM

## 2017-05-29 ENCOUNTER — Ambulatory Visit (INDEPENDENT_AMBULATORY_CARE_PROVIDER_SITE_OTHER): Payer: Medicaid Other | Admitting: Certified Nurse Midwife

## 2017-05-29 VITALS — BP 112/62 | Wt 187.0 lb

## 2017-05-29 DIAGNOSIS — Z3A16 16 weeks gestation of pregnancy: Secondary | ICD-10-CM

## 2017-05-29 DIAGNOSIS — O0990 Supervision of high risk pregnancy, unspecified, unspecified trimester: Secondary | ICD-10-CM

## 2017-05-29 MED ORDER — ONDANSETRON 4 MG PO TBDP: 4.0000 mg | ORAL_TABLET | Freq: Four times a day (QID) | ORAL | 1 refills | Status: DC | PRN

## 2017-05-29 NOTE — Progress Notes (Signed)
HROB at 16wk5d: history of severe preeclampsia/ HELLP/ first baby died of SIDS Has decreased her tobacco use to 2 cigs/day. HAs gained 11# in the last 4 weeks. Complains of persistent N/V in the AM. Not taking Diclegis at night, tries to take in AM, and it does not stay down. A family member gave her Zofran ODT and that worked. She requests a RX for Zofran ODT. Also complains of sacral back pain; it does not radiate down buttock or leg, but can extend to the left side of sacral area. Recommend heat/ice/ Biofreeze to sacrum and pelvic rocks Feeling FM Exam: FHTs 156. BP 112/62 Well groomed today and engaging in conversation A: IUP at 16wk5d Sacral back pain P: Anatomy scan and ROB in 3-4 weeks Zofran 4 mgm ODT #30/RF x1 -take one q6 hours prn

## 2017-05-29 NOTE — Progress Notes (Signed)
Pt c/o lower back pain and stiffness in same area when she previously had epidural.

## 2017-06-26 ENCOUNTER — Ambulatory Visit (INDEPENDENT_AMBULATORY_CARE_PROVIDER_SITE_OTHER): Payer: Medicaid Other

## 2017-06-26 ENCOUNTER — Ambulatory Visit (INDEPENDENT_AMBULATORY_CARE_PROVIDER_SITE_OTHER): Payer: Medicaid Other | Admitting: Obstetrics & Gynecology

## 2017-06-26 VITALS — BP 130/80 | Wt 188.0 lb

## 2017-06-26 DIAGNOSIS — Z8759 Personal history of other complications of pregnancy, childbirth and the puerperium: Secondary | ICD-10-CM

## 2017-06-26 DIAGNOSIS — Z3A16 16 weeks gestation of pregnancy: Secondary | ICD-10-CM | POA: Diagnosis not present

## 2017-06-26 DIAGNOSIS — O0990 Supervision of high risk pregnancy, unspecified, unspecified trimester: Secondary | ICD-10-CM

## 2017-06-26 DIAGNOSIS — Z3A20 20 weeks gestation of pregnancy: Secondary | ICD-10-CM

## 2017-06-26 MED ORDER — CONCEPT DHA 53.5-38-1 MG PO CAPS: 1.0000 | ORAL_CAPSULE | Freq: Every day | ORAL | 11 refills | Status: AC

## 2017-06-26 NOTE — Progress Notes (Signed)
  Subjective  Fetal Movement? yes Contractions? no Leaking Fluid? no Vaginal Bleeding? no  Objective  BP 130/80   Wt 188 lb (85.3 kg)   LMP 02/01/2017 Comment: about a month ago.  has no idea.  home preg tested positive  BMI 30.34 kg/m  General: NAD Pumonary: no increased work of breathing Abdomen: gravid, non-tender Extremities: no edema Psychiatric: mood appropriate, affect full  Assessment  22 y.o. G2P1000 at 2154w5d by  11/08/2017, by Last Menstrual Period presenting for routine prenatal visit  Plan   Problem List Items Addressed This Visit      Other   Supervision of high risk pregnancy, antepartum   History of pre-eclampsia    Other Visit Diagnoses    [redacted] weeks gestation of pregnancy    -  Primary    Review of ULTRASOUND.    I have personally reviewed images and report of [facility].    Plan of management to be discussed with patient.  Annamarie MajorPaul Jesus Poplin, MD, Merlinda FrederickFACOG [facility], [facility] 06/26/2017  2:33 PM

## 2017-06-26 NOTE — Patient Instructions (Signed)

## 2017-07-07 ENCOUNTER — Other Ambulatory Visit: Payer: Self-pay | Admitting: Certified Nurse Midwife

## 2017-07-15 ENCOUNTER — Telehealth: Payer: Self-pay

## 2017-07-15 MED ORDER — DOCUSATE SODIUM 100 MG PO CAPS: 100.0000 mg | ORAL_CAPSULE | Freq: Two times a day (BID) | ORAL | 2 refills | Status: DC | PRN

## 2017-07-15 NOTE — Telephone Encounter (Signed)
Colace eRx

## 2017-07-15 NOTE — Telephone Encounter (Signed)
Pt aware rx has been sent to pharmacy

## 2017-07-15 NOTE — Telephone Encounter (Signed)
Pt c/o constipation - what can she take?  [phone]  Called to adv metamucil, fibercon, citricil, colace, fresh fruits/vegetables, 64oz water a day.  Pt's mom states they need rx called in that mcaid will pay for as they don't have the money to purchase otc items - thinks colace can be rx'd.  Adv I would ask.

## 2017-07-24 ENCOUNTER — Ambulatory Visit (INDEPENDENT_AMBULATORY_CARE_PROVIDER_SITE_OTHER): Payer: Medicaid Other | Admitting: Maternal Newborn

## 2017-07-24 ENCOUNTER — Encounter: Payer: Self-pay | Admitting: Maternal Newborn

## 2017-07-24 VITALS — BP 130/78 | Wt 192.0 lb

## 2017-07-24 DIAGNOSIS — O26899 Other specified pregnancy related conditions, unspecified trimester: Secondary | ICD-10-CM

## 2017-07-24 DIAGNOSIS — R110 Nausea: Secondary | ICD-10-CM

## 2017-07-24 DIAGNOSIS — Z3A25 25 weeks gestation of pregnancy: Secondary | ICD-10-CM

## 2017-07-24 DIAGNOSIS — O0990 Supervision of high risk pregnancy, unspecified, unspecified trimester: Secondary | ICD-10-CM

## 2017-07-24 MED ORDER — ONDANSETRON 4 MG PO TBDP
ORAL_TABLET | ORAL | 0 refills | Status: DC
Start: 2017-07-24 — End: 2017-08-10

## 2017-07-24 NOTE — Progress Notes (Signed)
    Routine Prenatal Care Visit  Subjective  Susan [name] is a 22 y.o. G2P1000 at 2837w5d being seen today for ongoing prenatal care.  She is currently monitored for the following issues for this high-risk pregnancy and has Abnormal liver function tests; HELLP (hemolytic anemia/elev liver enzymes/low platelets in pregnancy); Chronic headaches; Tobacco abuse; Marijuana abuse; Supervision of high risk pregnancy, antepartum; History of pre-eclampsia; and Family history of SIDS (sudden infant death syndrome) on their problem list.  ----------------------------------------------------------------------------------- Patient reports nausea.  Rx for Zofran renewed. Contractions: Not present. Vag. Bleeding: None.  Movement: Present. Denies leaking of fluid.  ----------------------------------------------------------------------------------- The following portions of the patient's history were reviewed and updated as appropriate: allergies, current medications, past family history, past medical history, past social history, past surgical history and problem list. Problem list updated.   Objective  Blood pressure 130/78, weight 192 lb (87.1 kg), last menstrual period 02/01/2017. Pregravid weight 170 lb (77.1 kg) Total Weight Gain 22 lb (9.979 kg) Urinalysis: Urine Protein: Negative Urine Glucose: Negative  Fetal Status: Fetal Heart Rate (bpm): 153 Fundal Height: 24 cm Movement: Present     General:  Alert, oriented and cooperative. Patient is in no acute distress.  Skin: Skin is warm and dry. No rash noted.   Cardiovascular: Normal heart rate noted  Respiratory: Normal respiratory effort, no problems with respiration noted  Abdomen: Soft, gravid, appropriate for gestational age. Pain/Pressure: Absent     Pelvic:  Cervical exam deferred        Extremities: Normal range of motion.     Mental Status: Normal mood and affect. Normal behavior. Normal judgment and thought content.     Assessment    22 y.o. G2P1000 at 537w5d, EDD 11/08/2017 by Last Menstrual Period presenting for routine prenatal visit.  Plan   pregnancy 2 Problems (from 02/02/17 to present)    Problem Noted Resolved   Supervision of high risk pregnancy, antepartum 04/06/2017 by Nadara MustardHarris, Robert P, MD No   Overview Addendum 05/01/2017  5:21 PM by Farrel ConnersGutierrez, Colleen, CNM    Clinic [facility] Prenatal Labs  [facility] Blood type:   O POS  Genetic Screen declines Antibody: negative  Anatomic US  Rubella:   ImmuneVaricella: non immune  GTT Early:               Third trimester:  RPR:   non reactive  Rhogam  HBsAg:   negative  TDaP vaccine                       Flu Shot: HIV:   non reactive  Baby Food                                GBS:   Contraception  Pap:  CBB   ALT 42; AST 24  CS/VBAC  UDS + for MJ  Support Person             Advised that 28 week labs/GTT would be done next visit.   Preterm labor symptoms and general obstetric precautions  were reviewed with the patient.  Return in about 4 weeks (around 08/21/2017) for ROB with GTT/28 week labs.  Susan [name], CNM 07/26/2017  10:37 PM

## 2017-07-24 NOTE — Progress Notes (Signed)
No vb. No lof.  

## 2017-07-29 ENCOUNTER — Telehealth: Payer: Self-pay

## 2017-07-29 NOTE — Telephone Encounter (Signed)
Pt experiencing a really bad cold & red throat. Pt tried to take Zyrtec but it burned her nose & made it feel like it was bleeding. Inquiring what she can take while pregnant. ZO#[id number]#[phone]

## 2017-07-29 NOTE — Telephone Encounter (Signed)
Spoke w/[name]/pt's mom. States pt has stuffy, runny nose w/sore throat. Advised can use Sudafed, Mucinex or claritin and Tylenol for fever/pain. Advised to contact us if s&s not better or get worse. Aware not to use meds for more than 7 days.

## 2017-08-10 ENCOUNTER — Other Ambulatory Visit: Payer: Self-pay | Admitting: Maternal Newborn

## 2017-08-10 DIAGNOSIS — O26899 Other specified pregnancy related conditions, unspecified trimester: Secondary | ICD-10-CM

## 2017-08-10 DIAGNOSIS — R110 Nausea: Principal | ICD-10-CM

## 2017-08-21 ENCOUNTER — Other Ambulatory Visit: Payer: Medicaid Other

## 2017-08-21 ENCOUNTER — Ambulatory Visit (INDEPENDENT_AMBULATORY_CARE_PROVIDER_SITE_OTHER): Payer: Medicaid Other | Admitting: Maternal Newborn

## 2017-08-21 ENCOUNTER — Encounter: Payer: Self-pay | Admitting: Maternal Newborn

## 2017-08-21 VITALS — BP 134/80 | Wt 198.0 lb

## 2017-08-21 DIAGNOSIS — O99331 Smoking (tobacco) complicating pregnancy, first trimester: Secondary | ICD-10-CM | POA: Insufficient documentation

## 2017-08-21 DIAGNOSIS — O0990 Supervision of high risk pregnancy, unspecified, unspecified trimester: Secondary | ICD-10-CM

## 2017-08-21 DIAGNOSIS — Z3A28 28 weeks gestation of pregnancy: Secondary | ICD-10-CM

## 2017-08-21 DIAGNOSIS — O99333 Smoking (tobacco) complicating pregnancy, third trimester: Secondary | ICD-10-CM

## 2017-08-21 DIAGNOSIS — O99332 Smoking (tobacco) complicating pregnancy, second trimester: Secondary | ICD-10-CM | POA: Insufficient documentation

## 2017-08-21 NOTE — Progress Notes (Signed)
C/o left should blade pain - hurts to breathe.rj

## 2017-08-21 NOTE — Patient Instructions (Signed)
Third Trimester of Pregnancy The third trimester is from week 28 through week 40 (months 7 through 9). The third trimester is a time when the unborn baby (fetus) is growing rapidly. At the end of the ninth month, the fetus is about 20 inches in length and weighs 6-10 pounds. Body changes during your third trimester Your body will continue to go through many changes during pregnancy. The changes vary from woman to woman. During the third trimester:  Your weight will continue to increase. You can expect to gain 25-35 pounds (11-16 kg) by the end of the pregnancy.  You may begin to get stretch marks on your hips, abdomen, and breasts.  You may urinate more often because the fetus is moving lower into your pelvis and pressing on your bladder.  You may develop or continue to have heartburn. This is caused by increased hormones that slow down muscles in the digestive tract.  You may develop or continue to have constipation because increased hormones slow digestion and cause the muscles that push waste through your intestines to relax.  You may develop hemorrhoids. These are swollen veins (varicose veins) in the rectum that can itch or be painful.  You may develop swollen, bulging veins (varicose veins) in your legs.  You may have increased body aches in the pelvis, back, or thighs. This is due to weight gain and increased hormones that are relaxing your joints.  You may have changes in your hair. These can include thickening of your hair, rapid growth, and changes in texture. Some women also have hair loss during or after pregnancy, or hair that feels dry or thin. Your hair will most likely return to normal after your baby is born.  Your breasts will continue to grow and they will continue to become tender. A yellow fluid (colostrum) may leak from your breasts. This is the first milk you are producing for your baby.  Your belly button may stick out.  You may notice more swelling in your hands,  face, or ankles.  You may have increased tingling or numbness in your hands, arms, and legs. The skin on your belly may also feel numb.  You may feel short of breath because of your expanding uterus.  You may have more problems sleeping. This can be caused by the size of your belly, increased need to urinate, and an increase in your body's metabolism.  You may notice the fetus "dropping," or moving lower in your abdomen (lightening).  You may have increased vaginal discharge.  You may notice your joints feel loose and you may have pain around your pelvic bone.  What to expect at prenatal visits You will have prenatal exams every 2 weeks until week 36. Then you will have weekly prenatal exams. During a routine prenatal visit:  You will be weighed to make sure you and the baby are growing normally.  Your blood pressure will be taken.  Your abdomen will be measured to track your baby's growth.  The fetal heartbeat will be listened to.  Any test results from the previous visit will be discussed.  You may have a cervical check near your due date to see if your cervix has softened or thinned (effaced).  You will be tested for Group B streptococcus. This happens between 35 and 37 weeks.  Your health care provider may ask you:  What your birth plan is.  How you are feeling.  If you are feeling the baby move.  If you have had   any abnormal symptoms, such as leaking fluid, bleeding, severe headaches, or abdominal cramping.  If you are using any tobacco products, including cigarettes, chewing tobacco, and electronic cigarettes.  If you have any questions.  Other tests or screenings that may be performed during your third trimester include:  Blood tests that check for low iron levels (anemia).  Fetal testing to check the health, activity level, and growth of the fetus. Testing is done if you have certain medical conditions or if there are problems during the  pregnancy.  Nonstress test (NST). This test checks the health of your baby to make sure there are no signs of problems, such as the baby not getting enough oxygen. During this test, a belt is placed around your belly. The baby is made to move, and its heart rate is monitored during movement.  What is false labor? False labor is a condition in which you feel small, irregular tightenings of the muscles in the womb (contractions) that usually go away with rest, changing position, or drinking water. These are called Braxton Hicks contractions. Contractions may last for hours, days, or even weeks before true labor sets in. If contractions come at regular intervals, become more frequent, increase in intensity, or become painful, you should see your health care provider. What are the signs of labor?  Abdominal cramps.  Regular contractions that start at 10 minutes apart and become stronger and more frequent with time.  Contractions that start on the top of the uterus and spread down to the lower abdomen and back.  Increased pelvic pressure and dull back pain.  A watery or bloody mucus discharge that comes from the vagina.  Leaking of amniotic fluid. This is also known as your "water breaking." It could be a slow trickle or a gush. Let your health care provider know if it has a color or strange odor. If you have any of these signs, call your health care provider right away, even if it is before your due date. Follow these instructions at home: Medicines  Follow your health care provider's instructions regarding medicine use. Specific medicines may be either safe or unsafe to take during pregnancy.  Take a prenatal vitamin that contains at least 600 micrograms (mcg) of folic acid.  If you develop constipation, try taking a stool softener if your health care provider approves. Eating and drinking  Eat a balanced diet that includes fresh fruits and vegetables, whole grains, good sources of protein  such as meat, eggs, or tofu, and low-fat dairy. Your health care provider will help you determine the amount of weight gain that is right for you.  Avoid raw meat and uncooked cheese. These carry germs that can cause birth defects in the baby.  If you have low calcium intake from food, talk to your health care provider about whether you should take a daily calcium supplement.  Eat four or five small meals rather than three large meals a day.  Limit foods that are high in fat and processed sugars, such as fried and sweet foods.  To prevent constipation: ? Drink enough fluid to keep your urine clear or pale yellow. ? Eat foods that are high in fiber, such as fresh fruits and vegetables, whole grains, and beans. Activity  Exercise only as directed by your health care provider. Most women can continue their usual exercise routine during pregnancy. Try to exercise for 30 minutes at least 5 days a week. Stop exercising if you experience uterine contractions.  Avoid heavy   lifting.  Do not exercise in extreme heat or humidity, or at high altitudes.  Wear low-heel, comfortable shoes.  Practice good posture.  You may continue to have sex unless your health care provider tells you otherwise. Relieving pain and discomfort  Take frequent breaks and rest with your legs elevated if you have leg cramps or low back pain.  Take warm sitz baths to soothe any pain or discomfort caused by hemorrhoids. Use hemorrhoid cream if your health care provider approves.  Wear a good support bra to prevent discomfort from breast tenderness.  If you develop varicose veins: ? Wear support pantyhose or compression stockings as told by your healthcare provider. ? Elevate your feet for 15 minutes, 3-4 times a day. Prenatal care  Write down your questions. Take them to your prenatal visits.  Keep all your prenatal visits as told by your health care provider. This is important. Safety  Wear your seat belt at  all times when driving.  Make a list of emergency phone numbers, including numbers for family, friends, the hospital, and police and fire departments. General instructions  Avoid cat litter boxes and soil used by cats. These carry germs that can cause birth defects in the baby. If you have a cat, ask someone to clean the litter box for you.  Do not travel far distances unless it is absolutely necessary and only with the approval of your health care provider.  Do not use hot tubs, steam rooms, or saunas.  Do not drink alcohol.  Do not use any products that contain nicotine or tobacco, such as cigarettes and e-cigarettes. If you need help quitting, ask your health care provider.  Do not use any medicinal herbs or unprescribed drugs. These chemicals affect the formation and growth of the baby.  Do not douche or use tampons or scented sanitary pads.  Do not cross your legs for long periods of time.  To prepare for the arrival of your baby: ? Take prenatal classes to understand, practice, and ask questions about labor and delivery. ? Make a trial run to the hospital. ? Visit the hospital and tour the maternity area. ? Arrange for maternity or paternity leave through employers. ? Arrange for family and friends to take care of pets while you are in the hospital. ? Purchase a rear-facing car seat and make sure you know how to install it in your car. ? Pack your hospital bag. ? Prepare the baby's nursery. Make sure to remove all pillows and stuffed animals from the baby's crib to prevent suffocation.  Visit your dentist if you have not gone during your pregnancy. Use a soft toothbrush to brush your teeth and be gentle when you floss. Contact a health care provider if:  You are unsure if you are in labor or if your water has broken.  You become dizzy.  You have mild pelvic cramps, pelvic pressure, or nagging pain in your abdominal area.  You have lower back pain.  You have persistent  nausea, vomiting, or diarrhea.  You have an unusual or bad smelling vaginal discharge.  You have pain when you urinate. Get help right away if:  Your water breaks before 37 weeks.  You have regular contractions less than 5 minutes apart before 37 weeks.  You have a fever.  You are leaking fluid from your vagina.  You have spotting or bleeding from your vagina.  You have severe abdominal pain or cramping.  You have rapid weight loss or weight gain.    You have shortness of breath with chest pain.  You notice sudden or extreme swelling of your face, hands, ankles, feet, or legs.  Your baby makes fewer than 10 movements in 2 hours.  You have severe headaches that do not go away when you take medicine.  You have vision changes. Summary  The third trimester is from week 28 through week 40, months 7 through 9. The third trimester is a time when the unborn baby (fetus) is growing rapidly.  During the third trimester, your discomfort may increase as you and your baby continue to gain weight. You may have abdominal, leg, and back pain, sleeping problems, and an increased need to urinate.  During the third trimester your breasts will keep growing and they will continue to become tender. A yellow fluid (colostrum) may leak from your breasts. This is the first milk you are producing for your baby.  False labor is a condition in which you feel small, irregular tightenings of the muscles in the womb (contractions) that eventually go away. These are called Braxton Hicks contractions. Contractions may last for hours, days, or even weeks before true labor sets in.  Signs of labor can include: abdominal cramps; regular contractions that start at 10 minutes apart and become stronger and more frequent with time; watery or bloody mucus discharge that comes from the vagina; increased pelvic pressure and dull back pain; and leaking of amniotic fluid. This information is not intended to replace advice  given to you by your health care provider. Make sure you discuss any questions you have with your health care provider. Document Released: 04/22/2001 Document Revised: 10/04/2015 Document Reviewed: 06/29/2012 Elsevier Interactive Patient Education  2017 [employer]  

## 2017-08-21 NOTE — Progress Notes (Signed)
Routine Prenatal Care Visit  Subjective  Susan [name] is a 22 y.o. G2P1000 at 3470w5d being seen today for ongoing prenatal care.  She is currently monitored for the following issues for this high-risk pregnancy and has Abnormal liver function tests; HELLP (hemolytic anemia/elev liver enzymes/low platelets in pregnancy); Chronic headaches; Tobacco abuse; Marijuana abuse; Supervision of high risk pregnancy, antepartum; History of pre-eclampsia; and Family history of SIDS (sudden infant death syndrome) on their problem list.  ----------------------------------------------------------------------------------- Patient reports some pain in her right shoulder, worse on inspriation. Also some pain in left hip/lower back. Denies abdominal and epigastric pain..   Contractions: Not present. Vag. Bleeding: None.  Movement: Present. Denies leaking of fluid.  ----------------------------------------------------------------------------------- The following portions of the patient's history were reviewed and updated as appropriate: allergies, current medications, past family history, past medical history, past social history, past surgical history and problem list. Problem list updated.   Objective   Vitals:   08/21/17 1110  BP: 134/80   Last menstrual period 02/01/2017. Pregravid weight 170 lb (77.1 kg) Total Weight Gain 28 lb (12.7 kg) Urinalysis: Urine Protein: Negative Urine Glucose: Negative  Fetal Status: Fetal Heart Rate (bpm): 146 Fundal Height: 30 cm Movement: Present     General:  Alert, oriented and cooperative. Patient is in no acute distress.  Skin: Skin is warm and dry. No rash noted.   Cardiovascular: Normal heart rate noted  Respiratory: Normal respiratory effort, no problems with respiration noted  Abdomen: Soft, gravid, appropriate for gestational age. Pain/Pressure: Absent     Pelvic:  Cervical exam deferred        Extremities: Normal range of motion.     Mental Status:  Normal mood and affect. Normal behavior. Normal judgment and thought content.     Assessment   22 y.o. G2P1000 at 3270w5d, EDD 11/08/2017 by Last Menstrual Period presenting for routine prenatal visit.  Plan   pregnancy 2 Problems (from 02/02/17 to present)    Problem Noted Resolved   Supervision of high risk pregnancy, antepartum 04/06/2017 by Nadara MustardHarris, Robert P, MD No   Overview Addendum 05/01/2017  5:21 PM by Farrel ConnersGutierrez, Colleen, CNM    [facility] Prenatal Labs  Dating US at 8 weeks at [facility] Blood type:   O POS  Genetic Screen declines Antibody: negative  Anatomic US  Rubella:   ImmuneVaricella: non immune  GTT Early:               Third trimester:  RPR:   non reactive  Rhogam  HBsAg:   negative  TDaP vaccine                       Flu Shot: HIV:   non reactive  Baby Food                                GBS:   Contraception  Pap:  CBB   ALT 42; AST 24  CS/VBAC  UDS + for MJ  Support Person             Shoulder pain likely from muscle strain, high up on shoulder blade. Discussed comfort measures for back and hip pain. Patient admits smoking 5-6 cigarettes daily. Advised cessation. Offered Quitline literature; patient refused and said she will cut down on smoking. Advised about effects of smoking on pregnancy.  GTT/28 week labs today.  Preterm labor symptoms and general obstetric precautions  including but not limited to vaginal bleeding, contractions, leaking of fluid and fetal movement were reviewed in detail with the patient.  Please refer to After Visit Summary for other counseling recommendations.   Return in about 2 weeks (around 09/04/2017) for ROB.  Marcelyn Bruins, CNM 08/21/2017  1:36 PM

## 2017-08-22 LAB — 28 WEEK RH+PANEL
BASOS: 0 %
Basophils Absolute: 0 10*3/uL (ref 0.0–0.2)
EOS (ABSOLUTE): 0.1 10*3/uL (ref 0.0–0.4)
Eos: 1 %
GESTATIONAL DIABETES SCREEN: 167 mg/dL — AB (ref 65–139)
HIV Screen 4th Generation wRfx: NONREACTIVE
Hematocrit: 34.6 % (ref 34.0–46.6)
Hemoglobin: 11.8 g/dL (ref 11.1–15.9)
IMMATURE GRANULOCYTES: 0 %
Immature Grans (Abs): 0 10*3/uL (ref 0.0–0.1)
Lymphocytes Absolute: 2.6 10*3/uL (ref 0.7–3.1)
Lymphs: 15 %
MCH: 32.4 pg (ref 26.6–33.0)
MCHC: 34.1 g/dL (ref 31.5–35.7)
MCV: 95 fL (ref 79–97)
MONOS ABS: 0.9 10*3/uL (ref 0.1–0.9)
Monocytes: 5 %
NEUTROS PCT: 79 %
Neutrophils Absolute: 13.4 10*3/uL — ABNORMAL HIGH (ref 1.4–7.0)
PLATELETS: 283 10*3/uL (ref 150–379)
RBC: 3.64 x10E6/uL — AB (ref 3.77–5.28)
RDW: 13.2 % (ref 12.3–15.4)
RPR: NONREACTIVE
WBC: 17.1 10*3/uL — ABNORMAL HIGH (ref 3.4–10.8)

## 2017-08-22 LAB — COMPREHENSIVE METABOLIC PANEL
A/G RATIO: 1.5 (ref 1.2–2.2)
ALT: 29 IU/L (ref 0–32)
AST: 21 IU/L (ref 0–40)
Albumin: 3.8 g/dL (ref 3.5–5.5)
Alkaline Phosphatase: 145 IU/L — ABNORMAL HIGH (ref 39–117)
BUN/Creatinine Ratio: 13 (ref 9–23)
BUN: 7 mg/dL (ref 6–20)
Bilirubin Total: 0.2 mg/dL (ref 0.0–1.2)
CALCIUM: 8.8 mg/dL (ref 8.7–10.2)
CO2: 18 mmol/L — ABNORMAL LOW (ref 20–29)
Chloride: 101 mmol/L (ref 96–106)
Creatinine, Ser: 0.53 mg/dL — ABNORMAL LOW (ref 0.57–1.00)
GFR calc Af Amer: 158 mL/min/{1.73_m2} (ref >59)
GFR calc non Af Amer: 137 mL/min/{1.73_m2} (ref >59)
GLUCOSE: 156 mg/dL — AB (ref 65–99)
Globulin, Total: 2.5 g/dL (ref 1.5–4.5)
POTASSIUM: 3.6 mmol/L (ref 3.5–5.2)
Sodium: 137 mmol/L (ref 134–144)
Total Protein: 6.3 g/dL (ref 6.0–8.5)

## 2017-08-24 ENCOUNTER — Other Ambulatory Visit: Payer: Self-pay | Admitting: Maternal Newborn

## 2017-08-24 ENCOUNTER — Encounter: Payer: Self-pay | Admitting: Maternal Newborn

## 2017-08-24 DIAGNOSIS — R7309 Other abnormal glucose: Secondary | ICD-10-CM | POA: Insufficient documentation

## 2017-09-01 ENCOUNTER — Other Ambulatory Visit: Payer: Medicaid Other

## 2017-09-01 ENCOUNTER — Other Ambulatory Visit: Payer: Self-pay | Admitting: Maternal Newborn

## 2017-09-01 DIAGNOSIS — R7309 Other abnormal glucose: Secondary | ICD-10-CM

## 2017-09-01 DIAGNOSIS — R110 Nausea: Principal | ICD-10-CM

## 2017-09-01 DIAGNOSIS — O26899 Other specified pregnancy related conditions, unspecified trimester: Secondary | ICD-10-CM

## 2017-09-01 NOTE — Telephone Encounter (Signed)
Pt mother calling requesting refill on pt Zofran. Pt has been seeing JS. She is out of office. Can you please get CG to refill this please?

## 2017-09-01 NOTE — Telephone Encounter (Signed)
Pt's last note did not mention any nausea. May want to check with pt instead of her mom to see if she needs [facility]. Thank you.

## 2017-09-02 LAB — GESTATIONAL GLUCOSE TOLERANCE
GLUCOSE 2 HOUR GTT: 121 mg/dL (ref 65–154)
Glucose, Fasting: 95 mg/dL — ABNORMAL HIGH (ref 65–94)
Glucose, GTT - 1 Hour: 144 mg/dL (ref 65–179)
Glucose, GTT - 3 Hour: 128 mg/dL (ref 65–139)

## 2017-09-04 ENCOUNTER — Ambulatory Visit (INDEPENDENT_AMBULATORY_CARE_PROVIDER_SITE_OTHER): Payer: Medicaid Other | Admitting: Advanced Practice Midwife

## 2017-09-04 ENCOUNTER — Encounter: Payer: Self-pay | Admitting: Advanced Practice Midwife

## 2017-09-04 VITALS — BP 118/70 | Wt 197.0 lb

## 2017-09-04 DIAGNOSIS — Z23 Encounter for immunization: Secondary | ICD-10-CM

## 2017-09-04 DIAGNOSIS — Z3A30 30 weeks gestation of pregnancy: Secondary | ICD-10-CM | POA: Diagnosis not present

## 2017-09-04 NOTE — Patient Instructions (Signed)
Third Trimester of Pregnancy The third trimester is from week 28 through week 40 (months 7 through 9). The third trimester is a time when the unborn baby (fetus) is growing rapidly. At the end of the ninth month, the fetus is about 20 inches in length and weighs 6-10 pounds. Body changes during your third trimester Your body will continue to go through many changes during pregnancy. The changes vary from woman to woman. During the third trimester:  Your weight will continue to increase. You can expect to gain 25-35 pounds (11-16 kg) by the end of the pregnancy.  You may begin to get stretch marks on your hips, abdomen, and breasts.  You may urinate more often because the fetus is moving lower into your pelvis and pressing on your bladder.  You may develop or continue to have heartburn. This is caused by increased hormones that slow down muscles in the digestive tract.  You may develop or continue to have constipation because increased hormones slow digestion and cause the muscles that push waste through your intestines to relax.  You may develop hemorrhoids. These are swollen veins (varicose veins) in the rectum that can itch or be painful.  You may develop swollen, bulging veins (varicose veins) in your legs.  You may have increased body aches in the pelvis, back, or thighs. This is due to weight gain and increased hormones that are relaxing your joints.  You may have changes in your hair. These can include thickening of your hair, rapid growth, and changes in texture. Some women also have hair loss during or after pregnancy, or hair that feels dry or thin. Your hair will most likely return to normal after your baby is born.  Your breasts will continue to grow and they will continue to become tender. A yellow fluid (colostrum) may leak from your breasts. This is the first milk you are producing for your baby.  Your belly button may stick out.  You may notice more swelling in your hands,  face, or ankles.  You may have increased tingling or numbness in your hands, arms, and legs. The skin on your belly may also feel numb.  You may feel short of breath because of your expanding uterus.  You may have more problems sleeping. This can be caused by the size of your belly, increased need to urinate, and an increase in your body's metabolism.  You may notice the fetus "dropping," or moving lower in your abdomen (lightening).  You may have increased vaginal discharge.  You may notice your joints feel loose and you may have pain around your pelvic bone.  What to expect at prenatal visits You will have prenatal exams every 2 weeks until week 36. Then you will have weekly prenatal exams. During a routine prenatal visit:  You will be weighed to make sure you and the baby are growing normally.  Your blood pressure will be taken.  Your abdomen will be measured to track your baby's growth.  The fetal heartbeat will be listened to.  Any test results from the previous visit will be discussed.  You may have a cervical check near your due date to see if your cervix has softened or thinned (effaced).  You will be tested for Group B streptococcus. This happens between 35 and 37 weeks.  Your health care provider may ask you:  What your birth plan is.  How you are feeling.  If you are feeling the baby move.  If you have had   any abnormal symptoms, such as leaking fluid, bleeding, severe headaches, or abdominal cramping.  If you are using any tobacco products, including cigarettes, chewing tobacco, and electronic cigarettes.  If you have any questions.  Other tests or screenings that may be performed during your third trimester include:  Blood tests that check for low iron levels (anemia).  Fetal testing to check the health, activity level, and growth of the fetus. Testing is done if you have certain medical conditions or if there are problems during the  pregnancy.  Nonstress test (NST). This test checks the health of your baby to make sure there are no signs of problems, such as the baby not getting enough oxygen. During this test, a belt is placed around your belly. The baby is made to move, and its heart rate is monitored during movement.  What is false labor? False labor is a condition in which you feel small, irregular tightenings of the muscles in the womb (contractions) that usually go away with rest, changing position, or drinking water. These are called Braxton Hicks contractions. Contractions may last for hours, days, or even weeks before true labor sets in. If contractions come at regular intervals, become more frequent, increase in intensity, or become painful, you should see your health care provider. What are the signs of labor?  Abdominal cramps.  Regular contractions that start at 10 minutes apart and become stronger and more frequent with time.  Contractions that start on the top of the uterus and spread down to the lower abdomen and back.  Increased pelvic pressure and dull back pain.  A watery or bloody mucus discharge that comes from the vagina.  Leaking of amniotic fluid. This is also known as your "water breaking." It could be a slow trickle or a gush. Let your health care provider know if it has a color or strange odor. If you have any of these signs, call your health care provider right away, even if it is before your due date. Follow these instructions at home: Medicines  Follow your health care provider's instructions regarding medicine use. Specific medicines may be either safe or unsafe to take during pregnancy.  Take a prenatal vitamin that contains at least 600 micrograms (mcg) of folic acid.  If you develop constipation, try taking a stool softener if your health care provider approves. Eating and drinking  Eat a balanced diet that includes fresh fruits and vegetables, whole grains, good sources of protein  such as meat, eggs, or tofu, and low-fat dairy. Your health care provider will help you determine the amount of weight gain that is right for you.  Avoid raw meat and uncooked cheese. These carry germs that can cause birth defects in the baby.  If you have low calcium intake from food, talk to your health care provider about whether you should take a daily calcium supplement.  Eat four or five small meals rather than three large meals a day.  Limit foods that are high in fat and processed sugars, such as fried and sweet foods.  To prevent constipation: ? Drink enough fluid to keep your urine clear or pale yellow. ? Eat foods that are high in fiber, such as fresh fruits and vegetables, whole grains, and beans. Activity  Exercise only as directed by your health care provider. Most women can continue their usual exercise routine during pregnancy. Try to exercise for 30 minutes at least 5 days a week. Stop exercising if you experience uterine contractions.  Avoid heavy   lifting.  Do not exercise in extreme heat or humidity, or at high altitudes.  Wear low-heel, comfortable shoes.  Practice good posture.  You may continue to have sex unless your health care provider tells you otherwise. Relieving pain and discomfort  Take frequent breaks and rest with your legs elevated if you have leg cramps or low back pain.  Take warm sitz baths to soothe any pain or discomfort caused by hemorrhoids. Use hemorrhoid cream if your health care provider approves.  Wear a good support bra to prevent discomfort from breast tenderness.  If you develop varicose veins: ? Wear support pantyhose or compression stockings as told by your healthcare provider. ? Elevate your feet for 15 minutes, 3-4 times a day. Prenatal care  Write down your questions. Take them to your prenatal visits.  Keep all your prenatal visits as told by your health care provider. This is important. Safety  Wear your seat belt at  all times when driving.  Make a list of emergency phone numbers, including numbers for family, friends, the hospital, and police and fire departments. General instructions  Avoid cat litter boxes and soil used by cats. These carry germs that can cause birth defects in the baby. If you have a cat, ask someone to clean the litter box for you.  Do not travel far distances unless it is absolutely necessary and only with the approval of your health care provider.  Do not use hot tubs, steam rooms, or saunas.  Do not drink alcohol.  Do not use any products that contain nicotine or tobacco, such as cigarettes and e-cigarettes. If you need help quitting, ask your health care provider.  Do not use any medicinal herbs or unprescribed drugs. These chemicals affect the formation and growth of the baby.  Do not douche or use tampons or scented sanitary pads.  Do not cross your legs for long periods of time.  To prepare for the arrival of your baby: ? Take prenatal classes to understand, practice, and ask questions about labor and delivery. ? Make a trial run to the hospital. ? Visit the hospital and tour the maternity area. ? Arrange for maternity or paternity leave through employers. ? Arrange for family and friends to take care of pets while you are in the hospital. ? Purchase a rear-facing car seat and make sure you know how to install it in your car. ? Pack your hospital bag. ? Prepare the baby's nursery. Make sure to remove all pillows and stuffed animals from the baby's crib to prevent suffocation.  Visit your dentist if you have not gone during your pregnancy. Use a soft toothbrush to brush your teeth and be gentle when you floss. Contact a health care provider if:  You are unsure if you are in labor or if your water has broken.  You become dizzy.  You have mild pelvic cramps, pelvic pressure, or nagging pain in your abdominal area.  You have lower back pain.  You have persistent  nausea, vomiting, or diarrhea.  You have an unusual or bad smelling vaginal discharge.  You have pain when you urinate. Get help right away if:  Your water breaks before 37 weeks.  You have regular contractions less than 5 minutes apart before 37 weeks.  You have a fever.  You are leaking fluid from your vagina.  You have spotting or bleeding from your vagina.  You have severe abdominal pain or cramping.  You have rapid weight loss or weight gain.    You have shortness of breath with chest pain.  You notice sudden or extreme swelling of your face, hands, ankles, feet, or legs.  Your baby makes fewer than 10 movements in 2 hours.  You have severe headaches that do not go away when you take medicine.  You have vision changes. Summary  The third trimester is from week 28 through week 40, months 7 through 9. The third trimester is a time when the unborn baby (fetus) is growing rapidly.  During the third trimester, your discomfort may increase as you and your baby continue to gain weight. You may have abdominal, leg, and back pain, sleeping problems, and an increased need to urinate.  During the third trimester your breasts will keep growing and they will continue to become tender. A yellow fluid (colostrum) may leak from your breasts. This is the first milk you are producing for your baby.  False labor is a condition in which you feel small, irregular tightenings of the muscles in the womb (contractions) that eventually go away. These are called Braxton Hicks contractions. Contractions may last for hours, days, or even weeks before true labor sets in.  Signs of labor can include: abdominal cramps; regular contractions that start at 10 minutes apart and become stronger and more frequent with time; watery or bloody mucus discharge that comes from the vagina; increased pelvic pressure and dull back pain; and leaking of amniotic fluid. This information is not intended to replace advice  given to you by your health care provider. Make sure you discuss any questions you have with your health care provider. Document Released: 04/22/2001 Document Revised: 10/04/2015 Document Reviewed: 06/29/2012 Elsevier Interactive Patient Education  2017 [employer]  

## 2017-09-04 NOTE — Progress Notes (Signed)
ROB TDAP given today C/o heartburn

## 2017-09-04 NOTE — Progress Notes (Signed)
  Routine Prenatal Care Visit  Subjective  Susan BrokerHeather L [name] is a 22 y.o. G2P1000 at 613w5d being seen today for ongoing prenatal care.  She is currently monitored for the following issues for this high-risk pregnancy and has Abnormal liver function tests; HELLP (hemolytic anemia/elev liver enzymes/low platelets in pregnancy); Chronic headaches; Tobacco abuse; Marijuana abuse; Supervision of high risk pregnancy, antepartum; History of pre-eclampsia; Family history of SIDS (sudden infant death syndrome); Tobacco smoking affecting pregnancy in third trimester; and Elevated glucose tolerance test on their problem list.  ----------------------------------------------------------------------------------- Patient reports heartburn.   Contractions: Not present. Vag. Bleeding: None.  Movement: Present. Denies leaking of fluid.  ----------------------------------------------------------------------------------- The following portions of the patient's history were reviewed and updated as appropriate: allergies, current medications, past family history, past medical history, past social history, past surgical history and problem list. Problem list updated.   Objective  Blood pressure 118/70, weight 197 lb (89.4 kg), last menstrual period 02/01/2017. Pregravid weight 170 lb (77.1 kg) Total Weight Gain 27 lb (12.2 kg) Urinalysis: Urine Protein: Trace Urine Glucose: Negative  Fetal Status: Fetal Heart Rate (bpm): 143   Movement: Present     General:  Alert, oriented and cooperative. Patient is in no acute distress.  Skin: Skin is warm and dry. No rash noted.   Cardiovascular: Normal heart rate noted  Respiratory: Normal respiratory effort, no problems with respiration noted  Abdomen: Soft, gravid, appropriate for gestational age. Pain/Pressure: Absent     Pelvic:  Cervical exam deferred        Extremities: Normal range of motion.  Edema: None  Mental Status: Normal mood and affect. Normal behavior. Normal  judgment and thought content.   Assessment   22 y.o. G2P1000 at 1513w5d by  11/08/2017, by Last Menstrual Period presenting for routine prenatal visit  Plan   pregnancy 2 Problems (from 02/02/17 to present)    Problem Noted Resolved   Supervision of high risk pregnancy, antepartum 04/06/2017 by Nadara MustardHarris, Robert P, MD No   Overview Addendum 05/01/2017  5:21 PM by Farrel ConnersGutierrez, Colleen, CNM    [facility] Prenatal Labs  Dating [facility] at 8 weeks at [facility] Blood type:   O POS  Genetic Screen declines Antibody: negative  Anatomic US  Rubella:   ImmuneVaricella: non immune  GTT Early:               Third trimester:  RPR:   non reactive  Rhogam  HBsAg:   negative  TDaP vaccine                       Flu Shot: HIV:   non reactive  Baby Food                                GBS:   Contraception  Pap:  CBB   ALT 42; AST 24  CS/VBAC  UDS + for MJ  Support Person                Preterm labor symptoms and general obstetric precautions including but not limited to vaginal bleeding, contractions, leaking of fluid and fetal movement were reviewed in detail with the patient. Please refer to After Visit Summary for other counseling recommendations.  Comfort measures for heartburn discussed  Return in about 2 weeks (around 09/18/2017) for rob.  Tresea MallJane Petra Dumler, CNM 09/04/2017 12:07 PM

## 2017-09-09 ENCOUNTER — Telehealth: Payer: Self-pay

## 2017-09-09 ENCOUNTER — Other Ambulatory Visit: Payer: Self-pay | Admitting: Advanced Practice Midwife

## 2017-09-09 MED ORDER — RANITIDINE HCL 150 MG PO TABS: 150.0000 mg | ORAL_TABLET | Freq: Two times a day (BID) | ORAL | 0 refills | Status: DC

## 2017-09-09 NOTE — Telephone Encounter (Signed)
Rx refill sent and patient notified

## 2017-09-09 NOTE — Progress Notes (Signed)
Rx refill Zantac sent to patient pharmacy

## 2017-09-09 NOTE — Telephone Encounter (Signed)
Pt needs refill of zantac .  Heartburn is really really bad.  [phone]

## 2017-09-18 ENCOUNTER — Encounter: Payer: Self-pay | Admitting: Maternal Newborn

## 2017-09-18 ENCOUNTER — Ambulatory Visit (INDEPENDENT_AMBULATORY_CARE_PROVIDER_SITE_OTHER): Payer: Medicaid Other | Admitting: Maternal Newborn

## 2017-09-18 VITALS — BP 120/70 | Wt 200.0 lb

## 2017-09-18 DIAGNOSIS — O26899 Other specified pregnancy related conditions, unspecified trimester: Secondary | ICD-10-CM

## 2017-09-18 DIAGNOSIS — R110 Nausea: Secondary | ICD-10-CM

## 2017-09-18 DIAGNOSIS — O0990 Supervision of high risk pregnancy, unspecified, unspecified trimester: Secondary | ICD-10-CM

## 2017-09-18 DIAGNOSIS — Z3A32 32 weeks gestation of pregnancy: Secondary | ICD-10-CM

## 2017-09-18 MED ORDER — ONDANSETRON 4 MG PO TBDP: ORAL_TABLET | ORAL | 2 refills | Status: DC

## 2017-09-18 NOTE — Progress Notes (Signed)
    Routine Prenatal Care Visit  Subjective  Susan [name] is a 22 y.o. G2P1000 at [redacted]w[redacted]d being seen today for ongoing prenatal care.  She is currently monitored for the following issues for this high-risk pregnancy and has Abnormal liver function tests; Chronic headaches; Tobacco abuse; Marijuana abuse; Supervision of high risk pregnancy, antepartum; History of pre-eclampsia; Family history of SIDS (sudden infant death syndrome); Tobacco smoking affecting pregnancy in third trimester; and Elevated glucose tolerance test on their problem list.  ----------------------------------------------------------------------------------- Patient reports heartburn and nausea.  Zofran helps and she does not have vomiting when she takes it. Also having occasional Braxton-Hicks that are uncomfortable when baby moves to top or bottom of uterus. Contractions: Not present. Vag. Bleeding: None.  Movement: Present. Denies leaking of fluid.  ----------------------------------------------------------------------------------- The following portions of the patient's history were reviewed and updated as appropriate: allergies, current medications, past family history, past medical history, past social history, past surgical history and problem list. Problem list updated.   Objective  Blood pressure 120/70, weight 200 lb (90.7 kg), last menstrual period 02/01/2017. Pregravid weight 170 lb (77.1 kg) Total Weight Gain 30 lb (13.6 kg) Urinalysis: No sample today Fetal Status: Fetal Heart Rate (bpm): 150 Fundal Height: 33 cm Movement: Present     General:  Alert, oriented and cooperative. Patient is in no acute distress.  Skin: Skin is warm and dry. No rash noted.   Cardiovascular: Normal heart rate noted  Respiratory: Normal respiratory effort, no problems with respiration noted  Abdomen: Soft, gravid, appropriate for gestational age. Pain/Pressure: Present     Pelvic:  Cervical exam deferred        Extremities:  Normal range of motion.     Mental Status: Normal mood and affect. Normal behavior. Normal judgment and thought content.     Assessment   22 y.o. G2P1000 at [redacted]w[redacted]d, EDD 11/08/2017 by Last Menstrual Period presenting for routine prenatal visit.  Plan   pregnancy 2 Problems (from 02/02/17 to present)    Problem Noted Resolved   Supervision of high risk pregnancy, antepartum 04/06/2017 by Nadara Mustard, MD No   Overview Addendum 05/01/2017  5:21 PM by Farrel Conners, CNM    [facility] Prenatal Labs  Dating [place] at 8 weeks at [facility] Blood type:   O POS  Genetic Screen declines Antibody: negative  Anatomic [place]  Rubella:   ImmuneVaricella: non immune  GTT Early:               Third trimester:  RPR:   non reactive  Rhogam  HBsAg:   negative  TDaP vaccine                       Flu Shot: HIV:   non reactive  Baby Food                                GBS:   Contraception  Pap:  CBB   ALT 42; AST 24  CS/VBAC  UDS + for MJ  Support Person             Renewed Rx for Zofran.  Preterm labor symptoms and general obstetric precautions including but not limited to vaginal bleeding, contractions, leaking of fluid and fetal movement were reviewed.  Return in about 2 weeks (around 10/02/2017) for ROB.  Marcelyn Bruins, CNM 09/18/2017  12:09 PM

## 2017-09-18 NOTE — Progress Notes (Signed)
ROB Pain/Pressure  No concerns

## 2017-09-22 ENCOUNTER — Encounter: Payer: Self-pay | Admitting: Obstetrics and Gynecology

## 2017-09-22 ENCOUNTER — Telehealth: Payer: Self-pay

## 2017-09-22 ENCOUNTER — Ambulatory Visit (INDEPENDENT_AMBULATORY_CARE_PROVIDER_SITE_OTHER): Payer: Medicaid Other | Admitting: Obstetrics and Gynecology

## 2017-09-22 VITALS — BP 130/72 | Wt 199.0 lb

## 2017-09-22 DIAGNOSIS — O99320 Drug use complicating pregnancy, unspecified trimester: Secondary | ICD-10-CM

## 2017-09-22 DIAGNOSIS — O0990 Supervision of high risk pregnancy, unspecified, unspecified trimester: Secondary | ICD-10-CM

## 2017-09-22 DIAGNOSIS — Z3A33 33 weeks gestation of pregnancy: Secondary | ICD-10-CM

## 2017-09-22 MED ORDER — OMEPRAZOLE 20 MG PO CPDR: 20.0000 mg | DELAYED_RELEASE_CAPSULE | Freq: Every day | ORAL | 11 refills | Status: DC

## 2017-09-22 NOTE — Telephone Encounter (Signed)
Pt calling triage today regarding abdominal pain. Pt has made appt for today with AMS

## 2017-09-22 NOTE — Progress Notes (Signed)
ROB Left side pain above rib cage Constipation

## 2017-09-22 NOTE — Progress Notes (Signed)
    Routine Prenatal Care Visit  Subjective  Susan [name] is a 22 y.o. G2P1000 at [redacted]w[redacted]d being seen today for ongoing prenatal care.  She is currently monitored for the following issues for this high-risk pregnancy and has Abnormal liver function tests; Chronic headaches; Tobacco abuse; Marijuana abuse; Supervision of high risk pregnancy, antepartum; History of pre-eclampsia; Family history of SIDS (sudden infant death syndrome); Tobacco smoking affecting pregnancy in third trimester; and Elevated glucose tolerance test on their problem list.  ----------------------------------------------------------------------------------- Patient reports LUQ pain, no associated with food intake.   Contractions: Not present. Vag. Bleeding: None.  Movement: Present. Denies leaking of fluid.  ----------------------------------------------------------------------------------- The following portions of the patient's history were reviewed and updated as appropriate: allergies, current medications, past family history, past medical history, past social history, past surgical history and problem list. Problem list updated.   Objective  Blood pressure 130/72, weight 199 lb (90.3 kg), last menstrual period 02/01/2017. Pregravid weight 170 lb (77.1 kg) Total Weight Gain 29 lb (13.2 kg) Urinalysis: Urine Protein: Negative Urine Glucose: Negative  Fetal Status: Fetal Heart Rate (bpm): 145   Movement: Present     General:  Alert, oriented and cooperative. Patient is in no acute distress.  Skin: Skin is warm and dry. No rash noted.   Cardiovascular: Normal heart rate noted  Respiratory: Normal respiratory effort, no problems with respiration noted  Abdomen: Soft, gravid, appropriate for gestational age. Pain/Pressure: Present     Pelvic:  Cervical exam deferred        Extremities: Normal range of motion.     ental Status: Normal mood and affect. Normal behavior. Normal judgment and thought content.      Assessment   22 y.o. G2P1000 at [redacted]w[redacted]d by  11/08/2017, by Last Menstrual Period presenting for work-in prenatal visit  Plan   pregnancy 2 Problems (from 02/02/17 to present)    Problem Noted Resolved   Supervision of high risk pregnancy, antepartum 04/06/2017 by Nadara Mustard, MD No   Overview Addendum 09/22/2017  2:58 PM by Vena Austria, MD    [facility]  Dating [place] at 8 weeks at [facility] Blood type:   O POS  Genetic Screen declines Antibody: negative  Anatomic [place]  Rubella:   ImmuneVaricella: non immune  GTT 164 3-hr 95, 144, 121, 128 RPR:   non reactive  Rhogam N/A HBsAg:   negative  TDaP vaccine 09/04/17  Flu Shot: Declined HIV:   non reactive  Baby Food                                GBS:   Contraception  Pap:  CBB   ALT 42; AST 24  CS/VBAC  UDS + for MJ  Support Person                Gestational age appropriate obstetric precautions including but not limited to vaginal bleeding, contractions, leaking of fluid and fetal movement were reviewed in detail with the patient.   - rx omeprazole UDS today - could be component of Tietze syndrome as reproducible pain over left lower ribs but otherwise benign exam  Return Keep prior appointment.  Vena Austria, MD, [facility], [facility] 09/23/2017, 1:30 PM

## 2017-09-29 LAB — URINE DRUG PANEL 7
AMPHETAMINES, URINE: NEGATIVE ng/mL
Barbiturate Quant, Ur: NEGATIVE ng/mL
Benzodiazepine Quant, Ur: NEGATIVE ng/mL
CANNABINOID QUANT UR: POSITIVE — AB
COCAINE (METAB.): NEGATIVE ng/mL
OPIATE QUANT UR: NEGATIVE ng/mL
PCP QUANT UR: NEGATIVE ng/mL

## 2017-10-02 ENCOUNTER — Encounter: Payer: Medicaid Other | Admitting: Advanced Practice Midwife

## 2017-10-02 ENCOUNTER — Encounter: Payer: Self-pay | Admitting: Advanced Practice Midwife

## 2017-10-02 ENCOUNTER — Ambulatory Visit (INDEPENDENT_AMBULATORY_CARE_PROVIDER_SITE_OTHER): Payer: Medicaid Other | Admitting: Advanced Practice Midwife

## 2017-10-02 VITALS — BP 128/76 | Wt 203.0 lb

## 2017-10-02 DIAGNOSIS — Z3A34 34 weeks gestation of pregnancy: Secondary | ICD-10-CM

## 2017-10-02 NOTE — Progress Notes (Signed)
34 wk instructions/FKC's given. C/o sinus congestion x1 week. Not currently using any OTC meds for relief

## 2017-10-02 NOTE — Progress Notes (Signed)
  Routine Prenatal Care Visit  Subjective  Susan [name] is a 22 y.o. G2P1000 at [redacted]w[redacted]d being seen today for ongoing prenatal care.  She is currently monitored for the following issues for this high-risk pregnancy and has Abnormal liver function tests; Chronic headaches; Tobacco abuse; Marijuana abuse; Supervision of high risk pregnancy, antepartum; History of pre-eclampsia; Family history of SIDS (sudden infant death syndrome); Tobacco smoking affecting pregnancy in third trimester; and Elevated glucose tolerance test on their problem list.  ----------------------------------------------------------------------------------- Patient reports sinus congestion- reviewed safe OTC medications and the importance of increasing hydration. She reports having cut down on cigarette smoking to 2 or 3 per day.  Encouraged smoking cessation. Contractions: Not present. Vag. Bleeding: None.  Movement: Present. Denies leaking of fluid.  ----------------------------------------------------------------------------------- The following portions of the patient's history were reviewed and updated as appropriate: allergies, current medications, past family history, past medical history, past social history, past surgical history and problem list. Problem list updated.   Objective  Blood pressure 128/76, weight 203 lb (92.1 kg), last menstrual period 02/01/2017. Pregravid weight 170 lb (77.1 kg) Total Weight Gain 33 lb (15 kg) Urinalysis:      Fetal Status: Fetal Heart Rate (bpm): 153 Fundal Height: 36 cm Movement: Present     General:  Alert, oriented and cooperative. Patient is in no acute distress.  Skin: Skin is warm and dry. No rash noted.   Cardiovascular: Normal heart rate noted  Respiratory: Normal respiratory effort, no problems with respiration noted  Abdomen: Soft, gravid, appropriate for gestational age. Pain/Pressure: Absent     Pelvic:  Cervical exam deferred        Extremities: Normal range of  motion.  Edema: None  Mental Status: Normal mood and affect. Normal behavior. Normal judgment and thought content.   Assessment   22 y.o. G2P1000 at [redacted]w[redacted]d by  11/08/2017, by Last Menstrual Period presenting for routine prenatal visit  Plan   pregnancy 2 Problems (from 02/02/17 to present)    Problem Noted Resolved   Supervision of high risk pregnancy, antepartum 04/06/2017 by Nadara Mustard, MD No   Overview Addendum 09/22/2017  2:58 PM by Vena Austria, MD    [facility]  Dating [place] at 8 weeks at [facility] Blood type:   O POS  Genetic Screen declines Antibody: negative  Anatomic [place]  Rubella:   ImmuneVaricella: non immune  GTT 164 3-hr 95, 144, 121, 128 RPR:   non reactive  Rhogam N/A HBsAg:   negative  TDaP vaccine 09/04/17  Flu Shot: Declined HIV:   non reactive  Baby Food                                GBS:   Contraception  Pap:  CBB   ALT 42; AST 24  CS/VBAC  UDS + for MJ  Support Person                Preterm labor symptoms and general obstetric precautions including but not limited to vaginal bleeding, contractions, leaking of fluid and fetal movement were reviewed in detail with the patient.   Return in about 2 weeks (around 10/16/2017) for rob.  Tresea Mall, CNM 10/02/2017 12:09 PM

## 2017-10-10 ENCOUNTER — Encounter: Payer: Self-pay | Admitting: Emergency Medicine

## 2017-10-10 ENCOUNTER — Emergency Department
Admission: EM | Admit: 2017-10-10 | Discharge: 2017-10-11 | Disposition: A | Discharge status: Home / Self Care | Payer: Medicaid Other | Attending: Emergency Medicine | Admitting: Emergency Medicine

## 2017-10-10 DIAGNOSIS — H9201 Otalgia, right ear: Secondary | ICD-10-CM

## 2017-10-10 DIAGNOSIS — Z79899 Other long term (current) drug therapy: Secondary | ICD-10-CM | POA: Insufficient documentation

## 2017-10-10 DIAGNOSIS — H60501 Unspecified acute noninfective otitis externa, right ear: Secondary | ICD-10-CM

## 2017-10-10 DIAGNOSIS — F17210 Nicotine dependence, cigarettes, uncomplicated: Secondary | ICD-10-CM | POA: Diagnosis not present

## 2017-10-10 MED ORDER — ACETAMINOPHEN 325 MG PO TABS
650.0000 mg | ORAL_TABLET | Freq: Once | ORAL | Status: AC
  Administered 2017-10-11: 650 mg via ORAL
  Filled 2017-10-10: qty 2

## 2017-10-10 NOTE — ED Triage Notes (Signed)
Patient with complaint of right ear pain that started yesterday.  

## 2017-10-11 MED ORDER — CIPROFLOXACIN-DEXAMETHASONE 0.3-0.1 % OT SUSP: 4.0000 [drp] | Freq: Two times a day (BID) | OTIC | 0 refills | Status: AC

## 2017-10-11 NOTE — ED Notes (Signed)
Pt discharged to home.  Family member driving.  Discharge instructions reviewed.  Verbalized understanding.  No questions or concerns at this time.  Teach back verified.  Pt in NAD.  No items left in ED.   

## 2017-10-11 NOTE — Discharge Instructions (Addendum)
Please follow up with your primary care physician for re evaluation of your ear for improvement in your otitis externa.

## 2017-10-11 NOTE — ED Notes (Signed)
Pt tolerating fluid challenge well at this time.

## 2017-10-11 NOTE — ED Provider Notes (Signed)
[facility] Emergency Department Provider Note   ____________________________________________   First MD Initiated Contact with Patient 10/10/17 2343     (approximate)  I have reviewed the triage vital signs and the nursing notes.   HISTORY  Chief Complaint Otalgia    HPI Susan [name] is a 22 y.o. female who comes into the hospital today with an ear infection.  The patient states that she has some pain to her right ear and she cannot hear out of it.  The patient has been having the symptoms for the past 1 to 2 days and it has been really bad.  The patient took some Tylenol but states that it did not work.  She is pregnant and due on June 30.  She rates her pain a 9 out of 10 in intensity.  She is here today for evaluation and treatment of her symptoms.  The patient has had no fevers no nausea no vomiting.   Past Medical History:  Diagnosis Date  . ADHD (attention deficit hyperactivity disorder)   . Depression   . Preeclampsia, severe, third trimester    HELLP Syndrome    Patient Active Problem List   Diagnosis Date Noted  . Elevated glucose tolerance test 08/24/2017  . Tobacco smoking affecting pregnancy in third trimester 08/21/2017  . Family history of SIDS (sudden infant death syndrome) 05/29/17  . Supervision of high risk pregnancy, antepartum 04/06/2017  . History of pre-eclampsia 04/06/2017  . Tobacco abuse 12/07/2016  . Marijuana abuse 12/07/2016  . Abnormal liver function tests 09/18/2016  . Chronic headaches 09/18/2016    Past Surgical History:  Procedure Laterality Date  . NO PAST SURGERIES      Prior to Admission medications   Medication Sig Start Date End Date Taking? Authorizing Provider  ciprofloxacin-dexamethasone (CIPRODEX) OTIC suspension Place 4 drops into the right ear 2 (two) times daily for 10 days. 10/11/17 10/21/17  Rebecka Apley, MD  omeprazole (PRILOSEC) 20 MG capsule Take 1 capsule (20 mg total) by mouth  daily. 09/22/17   Vena Austria, MD    Allergies Patient has no known allergies.  Family History  Problem Relation Age of Onset  . SIDS Son     Social History Social History   Tobacco Use  . Smoking status: Current Every Day Smoker    Types: Cigarettes  . Smokeless tobacco: Never Used  . Tobacco comment: 2-3 cigarettes per day/trying to quit  Substance Use Topics  . Alcohol use: Not Currently  . Drug use: Yes    Types: Marijuana    Review of Systems  Constitutional: No fever/chills Eyes: No visual changes. ENT: Right ear pain Cardiovascular: Denies chest pain. Respiratory: Denies shortness of breath. Gastrointestinal: No abdominal pain.  No nausea, no vomiting.  No diarrhea.  No constipation. Genitourinary: Negative for dysuria. Musculoskeletal: Negative for back pain. Skin: Negative for rash. Neurological: Negative for headaches, focal weakness or numbness.   ____________________________________________   PHYSICAL EXAM:  VITAL SIGNS: ED Triage Vitals  Enc Vitals Group     BP 10/10/17 2314 132/80     Pulse Rate 10/10/17 2314 (!) 120     Resp 10/10/17 2314 20     Temp 10/10/17 2314 97.7 F (36.5 C)     Temp Source 10/10/17 2314 Oral     SpO2 10/10/17 2314 97 %     Weight 10/10/17 2307 203 lb (92.1 kg)     Height 10/10/17 2307 5\' 5"  (1.651 m)  Head Circumference --      Peak Flow --      Pain Score 10/10/17 2307 9     Pain Loc --      Pain Edu? --      Excl. in GC? --     Constitutional: Alert and oriented. Well appearing and in moderate distress. Ears: Left TM gray flat and dull, right TM not visualized.  Patient has some swelling to her right ear canal with some debris and fluid. Eyes: Conjunctivae are normal. PERRL. EOMI. Head: Atraumatic. Nose: No congestion/rhinnorhea. Mouth/Throat: Mucous membranes are moist.  Oropharynx non-erythematous. Cardiovascular: Tachycardia, regular rhythm. Grossly normal heart sounds.  Good peripheral  circulation. Respiratory: Normal respiratory effort.  No retractions. Lungs CTAB. Gastrointestinal: Soft and nontender. No distention.  Positive bowel sounds Musculoskeletal: No lower extremity tenderness nor edema.   Neurologic:  Normal speech and language.  Skin:  Skin is warm, dry and intact.  Psychiatric: Mood and affect are normal.   ____________________________________________   LABS (all labs ordered are listed, but only abnormal results are displayed)  Labs Reviewed - No data to display ____________________________________________  EKG  None ____________________________________________  RADIOLOGY  ED MD interpretation: None  Official radiology report(s): No results found.  ____________________________________________   PROCEDURES  Procedure(s) performed: None  Procedures  Critical Care performed: No  ____________________________________________   INITIAL IMPRESSION / ASSESSMENT AND PLAN / ED COURSE  As part of my medical decision making, I reviewed the following data within the electronic MEDICAL RECORD NUMBER Notes from prior ED visits and [place] Controlled Substance Database   This is a 22 year old female who comes into the hospital today with some right ear pain.  On exam the patient has some swelling of the canal and some pain with movement of the pinna.  The patient also has debris in the ear canal which is consistent with otitis externa.  I was unable to visualize the patient's tympanic membrane but I did give the patient a dose of Tylenol.  We did give the patient some oral hydration and she was tachycardic and I wrote her prescription for some Ciprodex otic.  The patient will be discharged and encouraged to follow-up with her primary care physician.  The patient's heart rate was improved prior to her discharge.  The patient's heart rate was improved prior to her discharge.      ____________________________________________   FINAL CLINICAL IMPRESSION(S) /  ED DIAGNOSES  Final diagnoses:  Acute otitis externa of right ear, unspecified type  Right ear pain     ED Discharge Orders        Ordered    ciprofloxacin-dexamethasone (CIPRODEX) OTIC suspension  2 times daily     10/11/17 0107       Note:  This document was prepared using Dragon voice recognition software and may include unintentional dictation errors.    Rebecka ApleyWebster, Alyona Romack P, MD 10/11/17 [id number]

## 2017-10-12 ENCOUNTER — Other Ambulatory Visit: Payer: Self-pay

## 2017-10-12 ENCOUNTER — Emergency Department
Admission: EM | Admit: 2017-10-12 | Discharge: 2017-10-12 | Disposition: A | Discharge status: Home / Self Care | Payer: Medicaid Other | Attending: Emergency Medicine | Admitting: Emergency Medicine

## 2017-10-12 ENCOUNTER — Telehealth: Payer: Self-pay

## 2017-10-12 DIAGNOSIS — H7291 Unspecified perforation of tympanic membrane, right ear: Secondary | ICD-10-CM | POA: Diagnosis not present

## 2017-10-12 DIAGNOSIS — F17210 Nicotine dependence, cigarettes, uncomplicated: Secondary | ICD-10-CM | POA: Insufficient documentation

## 2017-10-12 DIAGNOSIS — H9201 Otalgia, right ear: Secondary | ICD-10-CM | POA: Diagnosis present

## 2017-10-12 MED ORDER — HYDROCODONE-ACETAMINOPHEN 5-325 MG PO TABS: 1.0000 | ORAL_TABLET | Freq: Four times a day (QID) | ORAL | 0 refills | Status: AC | PRN

## 2017-10-12 MED ORDER — HYDROCODONE-ACETAMINOPHEN 5-325 MG PO TABS
1.0000 | ORAL_TABLET | Freq: Once | ORAL | Status: AC
  Administered 2017-10-12: 1 via ORAL
  Filled 2017-10-12: qty 1

## 2017-10-12 MED ORDER — AMOXICILLIN-POT CLAVULANATE 875-125 MG PO TABS: 1.0000 | ORAL_TABLET | Freq: Two times a day (BID) | ORAL | 0 refills | Status: DC

## 2017-10-12 NOTE — Telephone Encounter (Signed)
Pt was seen in ED for Saturday night for ear ache, rx'd ear drops, administered drops two time yesterday, woke up this am c side of face swollen and ear throbbing.  Does she need to be seen by us?  [phone] Adv to see primary care d/c ins or go to Urgent Care or back to ED.

## 2017-10-12 NOTE — Discharge Instructions (Signed)
Do not take tylenol while taking the Norco. Follow up with the ENT specialist. Call for an appointment.

## 2017-10-12 NOTE — ED Notes (Signed)
FHT 154 BPM.

## 2017-10-12 NOTE — ED Triage Notes (Signed)
R earache x 5 days. [redacted] weeks pregnant with no symptoms related to pregnancy.

## 2017-10-12 NOTE — ED Notes (Addendum)
See triage note  Presents with right ear pain  States ear is draining slightly  Afebrile  Was seen for same  Placed on ear drops  But not any better

## 2017-10-12 NOTE — ED Provider Notes (Signed)
[facility] Emergency Department Provider Note ____________________________________________  Time seen: Approximately 3:09 PM  I have reviewed the triage vital signs and the nursing notes.   HISTORY  Chief Complaint Otalgia    HPI Susan [name] is a 22 y.o. female who presents to the emergency department for treatment of right ear pain. She was evaluated here 2 days ago for the same. She was given cipro drops without relief. Pain is worse and tylenol does not help. Any movement of the ear is painful and hearing seems to be decreased. Chewing makes the pain worse. She also reports drainage from the ear.   Past Medical History:  Diagnosis Date  . ADHD (attention deficit hyperactivity disorder)   . Depression   . Preeclampsia, severe, third trimester    HELLP Syndrome    Patient Active Problem List   Diagnosis Date Noted  . Elevated glucose tolerance test 08/24/2017  . Tobacco smoking affecting pregnancy in third trimester 08/21/2017  . Family history of SIDS (sudden infant death syndrome) 05-30-17  . Supervision of high risk pregnancy, antepartum 04/06/2017  . History of pre-eclampsia 04/06/2017  . Tobacco abuse 12/07/2016  . Marijuana abuse 12/07/2016  . Abnormal liver function tests 09/18/2016  . Chronic headaches 09/18/2016    Past Surgical History:  Procedure Laterality Date  . NO PAST SURGERIES      Prior to Admission medications   Medication Sig Start Date End Date Taking? Authorizing Provider  ciprofloxacin-dexamethasone (CIPRODEX) OTIC suspension 4 drops 2 (two) times daily.   Yes [provider]  amoxicillin-clavulanate (AUGMENTIN) 875-125 MG tablet Take 1 tablet by mouth 2 (two) times daily. 10/12/17   Jamorris Ndiaye, Rulon Eisenmenger B, FNP  ciprofloxacin-dexamethasone (CIPRODEX) OTIC suspension Place 4 drops into the right ear 2 (two) times daily for 10 days. 10/11/17 10/21/17  Rebecka Apley, MD  HYDROcodone-acetaminophen (NORCO/VICODIN)  5-325 MG tablet Take 1 tablet by mouth every 6 (six) hours as needed for up to 3 days for severe pain. 10/12/17 10/15/17  Marielis Samara, Rulon Eisenmenger B, FNP  omeprazole (PRILOSEC) 20 MG capsule Take 1 capsule (20 mg total) by mouth daily. 09/22/17   Vena Austria, MD    Allergies Patient has no known allergies.  Family History  Problem Relation Age of Onset  . SIDS Son     Social History Social History   Tobacco Use  . Smoking status: Current Every Day Smoker    Types: Cigarettes  . Smokeless tobacco: Never Used  . Tobacco comment: 2-3 cigarettes per day/trying to quit  Substance Use Topics  . Alcohol use: Not Currently  . Drug use: Yes    Types: Marijuana    Review of Systems Constitutional: Subjective fever. Positive for decreased ability to hear from right ear(s). Eyes: Negative for discharge or drainage. ENT:       Positive for otalgia in right ear(s).      Positive for rhinorrhea or congestion.      Positive for sore throat. Gastrointestinal: Negative for nausea, vomiting, or diarrhea. Musculoskeletal: Negative for myalgias. Skin: Negative for rash, lesions, or wounds. Neurological: Negative for paresthesias. ____________________________________________   PHYSICAL EXAM:  VITAL SIGNS: ED Triage Vitals  Enc Vitals Group     BP 10/12/17 1353 137/77     Pulse Rate 10/12/17 1353 (!) 111     Resp 10/12/17 1353 18     Temp 10/12/17 1353 98.2 F (36.8 C)     Temp Source 10/12/17 1353 Oral     SpO2 10/12/17 1353 98 %  Weight 10/12/17 1353 203 lb (92.1 kg)     Height 10/12/17 1353 5\' 5"  (1.651 m)     Head Circumference --      Peak Flow --      Pain Score 10/12/17 1401 10     Pain Loc --      Pain Edu? --      Excl. in GC? --     Constitutional: Uncomfortable appearing. Eyes: Conjunctivae are clear without discharge or drainage. Ears:       Right TM appears ruptured.  [facility].      Left TM appears pearly grey. Head: Atraumatic. Nose: No  rhinorrhea or sinus pain on percussion. Mouth/Throat: Oropharynx appears normal. Tonsils flat and without exudate. Hematological/Lymphatic/Immunilogical: No palpable anterior cervical lymphadenopathy. Cardiovascular: Heart rate and rhythm are regular without murmur, gallop, or rub appreciated. Respiratory: Breath sounds are clear throughout to auscultation.  Neurologic:  Alert and oriented x 4. Skin: Intact and without rash, lesion, or wound on exposed skin surfaces. ____________________________________________   LABS (all labs ordered are listed, but only abnormal results are displayed)  Labs Reviewed - No data to display ____________________________________________   RADIOLOGY  Not indicated. ____________________________________________   PROCEDURES  Procedure(s) performed:   Procedures  ____________________________________________   INITIAL IMPRESSION / ASSESSMENT AND PLAN / ED COURSE  22 year old female presenting to the emergency department for second evaluation of right earache.  Tympanic membrane is ruptured today.  She will be encouraged to continue using the Ciprodex and will be given a prescription for Augmentin as well.  She will be given a 2-day supply of Norco and was advised to avoid taking additional Tylenol while taking this medication.  She was advised to avoid getting water in her ear as well.  She was given a referral to see the ear nose and throat specialist and is to call to schedule follow-up appointment.  She was advised to return to the emergency department for symptoms of change or worsen if she is unable to see her primary care provider or the specialist.  Pertinent labs & imaging results that were available during my care of the patient were reviewed by me and considered in my medical decision making (see chart for details). ____________________________________________   FINAL CLINICAL IMPRESSION(S) / ED DIAGNOSES  Final diagnoses:  Tympanic  membrane rupture, right    ED Discharge Orders        Ordered    amoxicillin-clavulanate (AUGMENTIN) 875-125 MG tablet  2 times daily     10/12/17 1524    HYDROcodone-acetaminophen (NORCO/VICODIN) 5-325 MG tablet  Every 6 hours PRN     10/12/17 1524      If controlled substance prescribed during this visit, 12 month history viewed on the [facility] prior to issuing an initial prescription for Schedule II or III opiod.   Note:  This document was prepared using Dragon voice recognition software and may include unintentional dictation errors.     Chinita Pesterriplett, Cisco Kindt B, FNP 10/12/17 1540    Arnaldo NatalMalinda, Paul F, MD 10/12/17 [phone]

## 2017-10-16 ENCOUNTER — Ambulatory Visit (INDEPENDENT_AMBULATORY_CARE_PROVIDER_SITE_OTHER): Payer: Medicaid Other | Admitting: Advanced Practice Midwife

## 2017-10-16 VITALS — BP 128/80 | Wt 208.0 lb

## 2017-10-16 DIAGNOSIS — Z3685 Encounter for antenatal screening for Streptococcus B: Secondary | ICD-10-CM

## 2017-10-16 DIAGNOSIS — Z3A36 36 weeks gestation of pregnancy: Secondary | ICD-10-CM

## 2017-10-16 DIAGNOSIS — J302 Other seasonal allergic rhinitis: Secondary | ICD-10-CM

## 2017-10-16 DIAGNOSIS — Z113 Encounter for screening for infections with a predominantly sexual mode of transmission: Secondary | ICD-10-CM

## 2017-10-16 MED ORDER — LORATADINE 10 MG PO TABS: 10.0000 mg | ORAL_TABLET | Freq: Every day | ORAL | 1 refills | Status: DC

## 2017-10-16 NOTE — Progress Notes (Signed)
  Routine Prenatal Care Visit  Subjective  Susan [name] is a 22 y.o. G2P1000 at 4459w5d being seen today for ongoing prenatal care.  She is currently monitored for the following issues for this high-risk pregnancy and has Abnormal liver function tests; Chronic headaches; Tobacco abuse; Marijuana abuse; Supervision of high risk pregnancy, antepartum; History of pre-eclampsia; Family history of SIDS (sudden infant death syndrome); Tobacco smoking affecting pregnancy in third trimester; and Elevated glucose tolerance test on their problem list.  ----------------------------------------------------------------------------------- Patient reports seasonal allergies. Requests Rx for allergy medication. Suggested saline nasal spray. She wasn't sure if she could afford that, but she is still able to afford cigarettes. She has itching at night and denies rash. Suggested benadryl OTC for itching. Contractions: Not present. Vag. Bleeding: None.  Movement: Present. Denies leaking of fluid.  ----------------------------------------------------------------------------------- The following portions of the patient's history were reviewed and updated as appropriate: allergies, current medications, past family history, past medical history, past social history, past surgical history and problem list. Problem list updated.   Objective  Blood pressure 128/80, weight 208 lb (94.3 kg), last menstrual period 02/01/2017. Pregravid weight 170 lb (77.1 kg) Total Weight Gain 38 lb (17.2 kg) Urinalysis: Urine Protein: Trace Urine Glucose: Negative  Fetal Status: Fetal Heart Rate (bpm): 146 Fundal Height: 38 cm Movement: Present     General:  Alert, oriented and cooperative. Patient is in no acute distress.  Skin: Skin is warm and dry. No rash noted.   Cardiovascular: Normal heart rate noted  Respiratory: Normal respiratory effort, no problems with respiration noted  Abdomen: Soft, gravid, appropriate for gestational  age. Pain/Pressure: Present     Pelvic:  GBS/aptima collected        Extremities: Normal range of motion.  Edema: None  Mental Status: Normal mood and affect. Normal behavior. Normal judgment and thought content.   Assessment   22 y.o. G2P1000 at 8159w5d by  11/08/2017, by Last Menstrual Period presenting for routine prenatal visit  Plan   pregnancy 2 Problems (from 02/02/17 to present)    Problem Noted Resolved   Supervision of high risk pregnancy, antepartum 04/06/2017 by Nadara MustardHarris, Robert P, MD No   Overview Addendum 09/22/2017  2:58 PM by Vena AustriaStaebler, Andreas, MD    [facility]  [facility] Blood type:   O POS  Genetic Screen declines Antibody: negative  Anatomic US  Rubella:   ImmuneVaricella: non immune  GTT 164 3-hr 95, 144, 121, 128 RPR:   non reactive  Rhogam N/A HBsAg:   negative  TDaP vaccine 09/04/17  Flu Shot: Declined HIV:   non reactive  Baby Food                                GBS:   Contraception  Pap:  CBB   ALT 42; AST 24  CS/VBAC  UDS + for MJ  Support Person                Preterm labor symptoms and general obstetric precautions including but not limited to vaginal bleeding, contractions, leaking of fluid and fetal movement were reviewed in detail with the patient. Please refer to After Visit Summary for other counseling recommendations.   Return in about 1 week (around 10/23/2017) for rob.  Susan [name], CNM 10/16/2017 12:01 PM

## 2017-10-16 NOTE — Progress Notes (Signed)
C/o itching all over especially when trying to sleep. Seen in ER 10/12/17 for busted eardrum. Not taking allergy meds d/t nose bleeds

## 2017-10-16 NOTE — Patient Instructions (Signed)
Vaginal Delivery Vaginal delivery means that you will give birth by pushing your baby out of your birth canal (vagina). A team of health care providers will help you before, during, and after vaginal delivery. Birth experiences are unique for every woman and every pregnancy, and birth experiences vary depending on where you choose to give birth. What should I do to prepare for my baby's birth? Before your baby is born, it is important to talk with your health care provider about:  Your labor and delivery preferences. These may include: ? Medicines that you may be given. ? How you will manage your pain. This might include non-medical pain relief techniques or injectable pain relief such as epidural analgesia. ? How you and your baby will be monitored during labor and delivery. ? Who may be in the labor and delivery room with you. ? Your feelings about surgical delivery of your baby (cesarean delivery, or C-section) if this becomes necessary. ? Your feelings about receiving donated blood through an IV tube (blood transfusion) if this becomes necessary.  Whether you are able: ? To take pictures or videos of the birth. ? To eat during labor and delivery. ? To move around, walk, or change positions during labor and delivery.  What to expect after your baby is born, such as: ? Whether delayed umbilical cord clamping and cutting is offered. ? Who will care for your baby right after birth. ? Medicines or tests that may be recommended for your baby. ? Whether breastfeeding is supported in your hospital or birth center. ? How long you will be in the hospital or birth center.  How any medical conditions you have may affect your baby or your labor and delivery experience.  To prepare for your baby's birth, you should also:  Attend all of your health care visits before delivery (prenatal visits) as recommended by your health care provider. This is important.  Prepare your home for your baby's  arrival. Make sure that you have: ? Diapers. ? Baby clothing. ? Feeding equipment. ? Safe sleeping arrangements for you and your baby.  Install a car seat in your vehicle. Have your car seat checked by a certified car seat installer to make sure that it is installed safely.  Think about who will help you with your new baby at home for at least the first several weeks after delivery.  What can I expect when I arrive at the birth center or hospital? Once you are in labor and have been admitted into the hospital or birth center, your health care provider may:  Review your pregnancy history and any concerns you have.  Insert an IV tube into one of your veins. This is used to give you fluids and medicines.  Check your blood pressure, pulse, temperature, and heart rate (vital signs).  Check whether your bag of water (amniotic sac) has broken (ruptured).  Talk with you about your birth plan and discuss pain control options.  Monitoring Your health care provider may monitor your contractions (uterine monitoring) and your baby's heart rate (fetal monitoring). You may need to be monitored:  Often, but not continuously (intermittently).  All the time or for long periods at a time (continuously). Continuous monitoring may be needed if: ? You are taking certain medicines, such as medicine to relieve pain or make your contractions stronger. ? You have pregnancy or labor complications.  Monitoring may be done by:  Placing a special stethoscope or a handheld monitoring device on your abdomen to   check your baby's heartbeat, and feeling your abdomen for contractions. This method of monitoring does not continuously record your baby's heartbeat or your contractions.  Placing monitors on your abdomen (external monitors) to record your baby's heartbeat and the frequency and length of contractions. You may not have to wear external monitors all the time.  Placing monitors inside of your uterus  (internal monitors) to record your baby's heartbeat and the frequency, length, and strength of your contractions. ? Your health care provider may use internal monitors if he or she needs more information about the strength of your contractions or your baby's heart rate. ? Internal monitors are put in place by passing a thin, flexible wire through your vagina and into your uterus. Depending on the type of monitor, it may remain in your uterus or on your baby's head until birth. ? Your health care provider will discuss the benefits and risks of internal monitoring with you and will ask for your permission before inserting the monitors.  Telemetry. This is a type of continuous monitoring that can be done with external or internal monitors. Instead of having to stay in bed, you are able to move around during telemetry. Ask your health care provider if telemetry is an option for you.  Physical exam Your health care provider may perform a physical exam. This may include:  Checking whether your baby is positioned: ? With the head toward your vagina (head-down). This is most common. ? With the head toward the top of your uterus (head-up or breech). If your baby is in a breech position, your health care provider may try to turn your baby to a head-down position so you can deliver vaginally. If it does not seem that your baby can be born vaginally, your provider may recommend surgery to deliver your baby. In rare cases, you may be able to deliver vaginally if your baby is head-up (breech delivery). ? Lying sideways (transverse). Babies that are lying sideways cannot be delivered vaginally.  Checking your cervix to determine: ? Whether it is thinning out (effacing). ? Whether it is opening up (dilating). ? How low your baby has moved into your birth canal.  What are the three stages of labor and delivery?  Normal labor and delivery is divided into the following three stages: Stage 1  Stage 1 is the  longest stage of labor, and it can last for hours or days. Stage 1 includes: ? Early labor. This is when contractions may be irregular, or regular and mild. Generally, early labor contractions are more than 10 minutes apart. ? Active labor. This is when contractions get longer, more regular, more frequent, and more intense. ? The transition phase. This is when contractions happen very close together, are very intense, and may last longer than during any other part of labor.  Contractions generally feel mild, infrequent, and irregular at first. They get stronger, more frequent (about every 2-3 minutes), and more regular as you progress from early labor through active labor and transition.  Many women progress through stage 1 naturally, but you may need help to continue making progress. If this happens, your health care provider may talk with you about: ? Rupturing your amniotic sac if it has not ruptured yet. ? Giving you medicine to help make your contractions stronger and more frequent.  Stage 1 ends when your cervix is completely dilated to 4 inches (10 cm) and completely effaced. This happens at the end of the transition phase. Stage 2  Once   your cervix is completely effaced and dilated to 4 inches (10 cm), you may start to feel an urge to push. It is common for the body to naturally take a rest before feeling the urge to push, especially if you received an epidural or certain other pain medicines. This rest period may last for up to 1-2 hours, depending on your unique labor experience.  During stage 2, contractions are generally less painful, because pushing helps relieve contraction pain. Instead of contraction pain, you may feel stretching and burning pain, especially when the widest part of your baby's head passes through the vaginal opening (crowning).  Your health care provider will closely monitor your pushing progress and your baby's progress through the vagina during stage 2.  Your  health care provider may massage the area of skin between your vaginal opening and anus (perineum) or apply warm compresses to your perineum. This helps it stretch as the baby's head starts to crown, which can help prevent perineal tearing. ? In some cases, an incision may be made in your perineum (episiotomy) to allow the baby to pass through the vaginal opening. An episiotomy helps to make the opening of the vagina larger to allow more room for the baby to fit through.  It is very important to breathe and focus so your health care provider can control the delivery of your baby's head. Your health care provider may have you decrease the intensity of your pushing, to help prevent perineal tearing.  After delivery of your baby's head, the shoulders and the rest of the body generally deliver very quickly and without difficulty.  Once your baby is delivered, the umbilical cord may be cut right away, or this may be delayed for 1-2 minutes, depending on your baby's health. This may vary among health care providers, hospitals, and birth centers.  If you and your baby are healthy enough, your baby may be placed on your chest or abdomen to help maintain the baby's temperature and to help you bond with each other. Some mothers and babies start breastfeeding at this time. Your health care team will dry your baby and help keep your baby warm during this time.  Your baby may need immediate care if he or she: ? Showed signs of distress during labor. ? Has a medical condition. ? Was born too early (prematurely). ? Had a bowel movement before birth (meconium). ? Shows signs of difficulty transitioning from being inside the uterus to being outside of the uterus. If you are planning to breastfeed, your health care team will help you begin a feeding. Stage 3  The third stage of labor starts immediately after the birth of your baby and ends after you deliver the placenta. The placenta is an organ that develops  during pregnancy to provide oxygen and nutrients to your baby in the womb.  Delivering the placenta may require some pushing, and you may have mild contractions. Breastfeeding can stimulate contractions to help you deliver the placenta.  After the placenta is delivered, your uterus should tighten (contract) and become firm. This helps to stop bleeding in your uterus. To help your uterus contract and to control bleeding, your health care provider may: ? Give you medicine by injection, through an IV tube, by mouth, or through your rectum (rectally). ? Massage your abdomen or perform a vaginal exam to remove any blood clots that are left in your uterus. ? Empty your bladder by placing a thin, flexible tube (catheter) into your bladder. ? Encourage   you to breastfeed your baby. After labor is over, you and your baby will be monitored closely to ensure that you are both healthy until you are ready to go home. Your health care team will teach you how to care for yourself and your baby. This information is not intended to replace advice given to you by your health care provider. Make sure you discuss any questions you have with your health care provider. Document Released: 02/05/2008 Document Revised: 11/16/2015 Document Reviewed: 05/13/2015 Elsevier Interactive Patient Education  2018 Elsevier Inc.  

## 2017-10-18 LAB — CHLAMYDIA/GONOCOCCUS/TRICHOMONAS, NAA
CHLAMYDIA BY NAA: NEGATIVE
Gonococcus by NAA: NEGATIVE
TRICH VAG BY NAA: NEGATIVE

## 2017-10-18 LAB — STREP GP B NAA: STREP GROUP B AG: NEGATIVE

## 2017-10-22 ENCOUNTER — Telehealth: Payer: Self-pay

## 2017-10-22 ENCOUNTER — Other Ambulatory Visit: Payer: Self-pay | Admitting: Obstetrics & Gynecology

## 2017-10-22 MED ORDER — TERCONAZOLE 0.4 % VA CREA: 1.0000 | TOPICAL_CREAM | Freq: Every day | VAGINAL | 0 refills | Status: DC

## 2017-10-22 NOTE — Telephone Encounter (Signed)
Pt mom aware  

## 2017-10-22 NOTE — Telephone Encounter (Signed)
Susan [name] (mom) reports pt has YI symptoms likely from abx she was on for busted eardrum. She is having thick d/c & is irritated. She has apt tomorrow but requesting rx so she can get started on treatment today. ZO#[phone]#[phone]

## 2017-10-22 NOTE — Telephone Encounter (Signed)
ERx done Terazole for vag tx of yeast infection in pregnancy

## 2017-10-23 ENCOUNTER — Ambulatory Visit (INDEPENDENT_AMBULATORY_CARE_PROVIDER_SITE_OTHER): Payer: Medicaid Other | Admitting: Obstetrics & Gynecology

## 2017-10-23 VITALS — BP 130/80 | Wt 206.0 lb

## 2017-10-23 DIAGNOSIS — Z3A37 37 weeks gestation of pregnancy: Secondary | ICD-10-CM

## 2017-10-23 DIAGNOSIS — F1210 Cannabis abuse, uncomplicated: Secondary | ICD-10-CM

## 2017-10-23 DIAGNOSIS — O0990 Supervision of high risk pregnancy, unspecified, unspecified trimester: Secondary | ICD-10-CM

## 2017-10-23 DIAGNOSIS — O99333 Smoking (tobacco) complicating pregnancy, third trimester: Secondary | ICD-10-CM

## 2017-10-23 MED ORDER — FLUCONAZOLE 150 MG PO TABS: 150.0000 mg | ORAL_TABLET | Freq: Once | ORAL | 0 refills | Status: AC

## 2017-10-23 NOTE — Progress Notes (Signed)
  Subjective  Fetal Movement? yes Contractions? no Leaking Fluid? no Vaginal Bleeding? no  Objective  BP 130/80   Wt 206 lb (93.4 kg)   LMP 02/01/2017 Comment: about a month ago.  has no idea.  home preg tested positive  BMI 34.28 kg/m  General: NAD Pumonary: no increased work of breathing Abdomen: gravid, non-tender Extremities: no edema Psychiatric: mood appropriate, affect full  Assessment  22 y.o. G2P1000 at 7862w5d by  11/08/2017, by Last Menstrual Period presenting for routine prenatal visit  Plan   Problem List Items Addressed This Visit      Other   Marijuana abuse   Supervision of high risk pregnancy, antepartum   Tobacco smoking affecting pregnancy in third trimester    Other Visit Diagnoses    [redacted] weeks gestation of pregnancy    -  Primary    Discussed need to not smoke cigarettes or marijuana any more this pregnancy Labor precautions counseled  Annamarie MajorPaul Oswald Pott, MD, Merlinda FrederickFACOG [facility], [facility] 10/23/2017  11:20 AM

## 2017-10-23 NOTE — Patient Instructions (Signed)
Braxton Hicks Contractions °Contractions of the uterus can occur throughout pregnancy, but they are not always a sign that you are in labor. You may have practice contractions called Braxton Hicks contractions. These false labor contractions are sometimes confused with true labor. °What are Braxton Hicks contractions? °Braxton Hicks contractions are tightening movements that occur in the muscles of the uterus before labor. Unlike true labor contractions, these contractions do not result in opening (dilation) and thinning of the cervix. Toward the end of pregnancy (32-34 weeks), Braxton Hicks contractions can happen more often and may become stronger. These contractions are sometimes difficult to tell apart from true labor because they can be very uncomfortable. You should not feel embarrassed if you go to the hospital with false labor. °Sometimes, the only way to tell if you are in true labor is for your health care provider to look for changes in the cervix. The health care provider will do a physical exam and may monitor your contractions. If you are not in true labor, the exam should show that your cervix is not dilating and your water has not broken. °If there are other health problems associated with your pregnancy, it is completely safe for you to be sent home with false labor. You may continue to have Braxton Hicks contractions until you go into true labor. °How to tell the difference between true labor and false labor °True labor °· Contractions last 30-70 seconds. °· Contractions become very regular. °· Discomfort is usually felt in the top of the uterus, and it spreads to the lower abdomen and low back. °· Contractions do not go away with walking. °· Contractions usually become more intense and increase in frequency. °· The cervix dilates and gets thinner. °False labor °· Contractions are usually shorter and not as strong as true labor contractions. °· Contractions are usually irregular. °· Contractions  are often felt in the front of the lower abdomen and in the groin. °· Contractions may go away when you walk around or change positions while lying down. °· Contractions get weaker and are shorter-lasting as time goes on. °· The cervix usually does not dilate or become thin. °Follow these instructions at home: °· Take over-the-counter and prescription medicines only as told by your health care provider. °· Keep up with your usual exercises and follow other instructions from your health care provider. °· Eat and drink lightly if you think you are going into labor. °· If Braxton Hicks contractions are making you uncomfortable: °? Change your position from lying down or resting to walking, or change from walking to resting. °? Sit and rest in a tub of warm water. °? Drink enough fluid to keep your urine pale yellow. Dehydration may cause these contractions. °? Do slow and deep breathing several times an hour. °· Keep all follow-up prenatal visits as told by your health care provider. This is important. °Contact a health care provider if: °· You have a fever. °· You have continuous pain in your abdomen. °Get help right away if: °· Your contractions become stronger, more regular, and closer together. °· You have fluid leaking or gushing from your vagina. °· You pass blood-tinged mucus (bloody show). °· You have bleeding from your vagina. °· You have low back pain that you never had before. °· You feel your baby’s head pushing down and causing pelvic pressure. °· Your baby is not moving inside you as much as it used to. °Summary °· Contractions that occur before labor are called Braxton   Hicks contractions, false labor, or practice contractions. °· Braxton Hicks contractions are usually shorter, weaker, farther apart, and less regular than true labor contractions. True labor contractions usually become progressively stronger and regular and they become more frequent. °· Manage discomfort from Braxton Hicks contractions by  changing position, resting in a warm bath, drinking plenty of water, or practicing deep breathing. °This information is not intended to replace advice given to you by your health care provider. Make sure you discuss any questions you have with your health care provider. °Document Released: 09/11/2016 Document Revised: 09/11/2016 Document Reviewed: 09/11/2016 °Elsevier Interactive Patient Education © 2018 Elsevier Inc. ° °

## 2017-10-30 ENCOUNTER — Ambulatory Visit (INDEPENDENT_AMBULATORY_CARE_PROVIDER_SITE_OTHER): Payer: Medicaid Other | Admitting: Obstetrics and Gynecology

## 2017-10-30 ENCOUNTER — Encounter: Payer: Self-pay | Admitting: Obstetrics and Gynecology

## 2017-10-30 VITALS — BP 134/76 | Wt 209.0 lb

## 2017-10-30 DIAGNOSIS — Z3A38 38 weeks gestation of pregnancy: Secondary | ICD-10-CM

## 2017-10-30 DIAGNOSIS — Z8759 Personal history of other complications of pregnancy, childbirth and the puerperium: Secondary | ICD-10-CM

## 2017-10-30 DIAGNOSIS — O0990 Supervision of high risk pregnancy, unspecified, unspecified trimester: Secondary | ICD-10-CM

## 2017-10-30 DIAGNOSIS — O99333 Smoking (tobacco) complicating pregnancy, third trimester: Secondary | ICD-10-CM

## 2017-10-30 DIAGNOSIS — O0993 Supervision of high risk pregnancy, unspecified, third trimester: Secondary | ICD-10-CM

## 2017-10-30 NOTE — Progress Notes (Incomplete)
  Routine Prenatal Care Visit  Subjective  Susan [name] is a 22 y.o. G2P1000 at 5141w5d being seen today for ongoing prenatal care.  She is currently monitored for the following issues for this {Blank single:19197::"high-risk","low-risk"} pregnancy and has Abnormal liver function tests; Chronic headaches; Tobacco abuse; Marijuana abuse; Supervision of high risk pregnancy, antepartum; History of pre-eclampsia; Family history of SIDS (sudden infant death syndrome); Tobacco smoking affecting pregnancy in third trimester; and Elevated glucose tolerance test on their problem list.  ----------------------------------------------------------------------------------- Patient reports {sx:14538}.   Contractions: Irritability. Vag. Bleeding: None.  Movement: Present. Denies leaking of fluid.  ----------------------------------------------------------------------------------- The following portions of the patient's history were reviewed and updated as appropriate: allergies, current medications, past family history, past medical history, past social history, past surgical history and problem list. Problem list updated.   Objective  Blood pressure 134/76, weight 209 lb (94.8 kg), last menstrual period 02/01/2017. Pregravid weight 170 lb (77.1 kg) Total Weight Gain 39 lb (17.7 kg) Urinalysis:      Fetal Status: Fetal Heart Rate (bpm): 140 Fundal Height: 38 cm Movement: Present  Presentation: Vertex  General:  Alert, oriented and cooperative. Patient is in no acute distress.  Skin: Skin is warm and dry. No rash noted.   Cardiovascular: Normal heart rate noted  Respiratory: Normal respiratory effort, no problems with respiration noted  Abdomen: Soft, gravid, appropriate for gestational age. Pain/Pressure: Absent     Pelvic:  {Blank single:19197::"Cervical exam performed","Cervical exam deferred"}        Extremities: Normal range of motion.  Edema: None  Mental Status: Normal mood and affect. Normal  behavior. Normal judgment and thought content.   Assessment   22 y.o. G2P1000 at 8041w5d by  11/08/2017, by Last Menstrual Period presenting for {Blank single:19197::"routine","work-in"} prenatal visit  Plan   pregnancy 2 Problems (from 02/02/17 to present)    Problem Noted Resolved   Supervision of high risk pregnancy, antepartum 04/06/2017 by Nadara MustardHarris, Robert P, MD No   Overview Addendum 09/22/2017  2:58 PM by Vena AustriaStaebler, Andreas, MD    [facility]  [facility] weeks [facility] Blood type:   O POS  Genetic Screen declines Antibody: negative  Anatomic US  Rubella:   ImmuneVaricella: non immune  GTT 164 3-hr 95, 144, 121, 128 RPR:   non reactive  Rhogam N/A HBsAg:   negative  TDaP vaccine 09/04/17  Flu Shot: Declined HIV:   non reactive  Baby Food                                GBS:   Contraception  Pap:  CBB   ALT 42; AST 24  CS/VBAC  UDS + for MJ  Support Person                {Blank single:19197::"Term","Preterm"} labor symptoms and general obstetric precautions including but not limited to vaginal bleeding, contractions, leaking of fluid and fetal movement were reviewed in detail with the patient. Please refer to After Visit Summary for other counseling recommendations.   Return in about 1 week (around 11/06/2017) for Routine Prenatal Appointment.  Thomasene MohairStephen Hareem Surowiec, MD, Merlinda FrederickFACOG [facility], [facility] 10/30/2017 11:32 AM

## 2017-10-30 NOTE — Progress Notes (Incomplete)
  Routine Prenatal Care Visit  Subjective  Susan [name] is a 22 y.o. G2P1000 at [redacted]w[redacted]d being seen today for ongoing prenatal care.  She is currently monitored for the following issues for this {Blank single:19197::"high-risk","low-risk"} pregnancy and has Abnormal liver function tests; Chronic headaches; Tobacco abuse; Marijuana abuse; Supervision of high risk pregnancy, antepartum; History of pre-eclampsia; Family history of SIDS (sudden infant death syndrome); Tobacco smoking affecting pregnancy in third trimester; and Elevated glucose tolerance test on their problem list.  ----------------------------------------------------------------------------------- Patient reports {sx:14538}.   Contractions: Irritability. Vag. Bleeding: None.  Movement: Present. Denies leaking of fluid.  ----------------------------------------------------------------------------------- The following portions of the patient's history were reviewed and updated as appropriate: allergies, current medications, past family history, past medical history, past social history, past surgical history and problem list. Problem list updated.   Objective  Blood pressure 134/76, weight 209 lb (94.8 kg), last menstrual period 02/01/2017. Pregravid weight 170 lb (77.1 kg) Total Weight Gain 39 lb (17.7 kg) Urinalysis:      Fetal Status: Fetal Heart Rate (bpm): 140 Fundal Height: 38 cm Movement: Present  Presentation: Vertex  General:  Alert, oriented and cooperative. Patient is in no acute distress.  Skin: Skin is warm and dry. No rash noted.   Cardiovascular: Normal heart rate noted  Respiratory: Normal respiratory effort, no problems with respiration noted  Abdomen: Soft, gravid, appropriate for gestational age. Pain/Pressure: Absent     Pelvic:  {Blank single:19197::"Cervical exam performed","Cervical exam deferred"}        Extremities: Normal range of motion.  Edema: None  Mental Status: Normal mood and affect. Normal  behavior. Normal judgment and thought content.   Assessment   22 y.o. G2P1000 at [redacted]w[redacted]d by  11/08/2017, by Last Menstrual Period presenting for {Blank single:19197::"routine","work-in"} prenatal visit  Plan   pregnancy 2 Problems (from 02/02/17 to present)    Problem Noted Resolved   Supervision of high risk pregnancy, antepartum 04/06/2017 by Harris, Robert P, MD No   Overview Addendum 09/22/2017  2:58 PM by Staebler, Andreas, MD    [facility]inic [facility] Prenatal [facility]  Dating US at 8 weeks at [facility] Blood type:   O POS  Genetic Screen declines Antibody: negative  Anatomic US  Rubella:   ImmuneVaricella: non immune  GTT 164 3-hr 95, 144, 121, 128 RPR:   non reactive  Rhogam N/A HBsAg:   negative  TDaP vaccine 09/04/17  Flu Shot: Declined HIV:   non reactive  Baby Food                                GBS:   Contraception  Pap:  CBB   ALT 42; AST 24  CS/VBAC  UDS + for MJ  Support Person                {Blank single:19197::"Term","Preterm"} labor symptoms and general obstetric precautions including but not limited to vaginal bleeding, contractions, leaking of fluid and fetal movement were reviewed in detail with the patient. Please refer to After Visit Summary for other counseling recommendations.   Return in about 1 week (around 11/06/2017) for Routine Prenatal Appointment.  Stephen Jackson, MD, FACOG [facility] 10/30/2017 11:32 AM   

## 2017-10-30 NOTE — Progress Notes (Signed)
  Routine Prenatal Care Visit  Subjective  Susan [name] is a 22 y.o. G2P1000 at 6619w5d being seen today for ongoing prenatal care.  She is currently monitored for the following issues for this high-risk pregnancy and has Abnormal liver function tests; Chronic headaches; Tobacco abuse; Marijuana abuse; Supervision of high risk pregnancy, antepartum; History of pre-eclampsia; Family history of SIDS (sudden infant death syndrome); Tobacco smoking affecting pregnancy in third trimester; and Elevated glucose tolerance test on their problem list.  ----------------------------------------------------------------------------------- Patient reports still has minor vaginal itching (took fluconazole last week), has some urinary hesitancy.   Contractions: Irritability. Vag. Bleeding: None.  Movement: Present. Denies leaking of fluid.  ----------------------------------------------------------------------------------- The following portions of the patient's history were reviewed and updated as appropriate: allergies, current medications, past family history, past medical history, past social history, past surgical history and problem list. Problem list updated.   Objective  Blood pressure 134/76, weight 209 lb (94.8 kg), last menstrual period 02/01/2017. Pregravid weight 170 lb (77.1 kg) Total Weight Gain 39 lb (17.7 kg) Urinalysis:      Fetal Status: Fetal Heart Rate (bpm): 140 Fundal Height: 38 cm Movement: Present  Presentation: Vertex  General:  Alert, oriented and cooperative. Patient is in no acute distress.  Skin: Skin is warm and dry. No rash noted.   Cardiovascular: Normal heart rate noted  Respiratory: Normal respiratory effort, no problems with respiration noted  Abdomen: Soft, gravid, appropriate for gestational age. Pain/Pressure: Absent     Pelvic:  Cervical exam deferred        Extremities: Normal range of motion.  Edema: None  Mental Status: Normal mood and affect. Normal behavior.  Normal judgment and thought content.   Assessment   22 y.o. G2P1000 at 8519w5d by  11/08/2017, by Last Menstrual Period presenting for routine prenatal visit  Plan   pregnancy 2 Problems (from 02/02/17 to present)    Problem Noted Resolved   Supervision of high risk pregnancy, antepartum 04/06/2017 by Nadara MustardHarris, Robert P, MD No   Overview Addendum 09/22/2017  2:58 PM by Vena AustriaStaebler, Andreas, MD    [facility]  Dating US at 8 weeks at [facility] Blood type:   O POS  Genetic Screen declines Antibody: negative  Anatomic US  Rubella:   ImmuneVaricella: non immune  GTT 164 3-hr 95, 144, 121, 128 RPR:   non reactive  Rhogam N/A HBsAg:   negative  TDaP vaccine 09/04/17  Flu Shot: Declined HIV:   non reactive  Baby Food                                GBS:   Contraception  Pap:  CBB   ALT 42; AST 24  CS/VBAC  UDS + for MJ  Support Person                Term labor symptoms and general obstetric precautions including but not limited to vaginal bleeding, contractions, leaking of fluid and fetal movement were reviewed in detail with the patient. Please refer to After Visit Summary for other counseling recommendations.   - declines pelvic exam - precautions reviewed - urine culture due to hesitancy  Return in about 1 week (around 11/06/2017) for Routine Prenatal Appointment.  Thomasene MohairStephen Shonte Beutler, MD, Merlinda FrederickFACOG [facility], [facility] 10/30/2017 11:32 AM

## 2017-11-01 LAB — URINE CULTURE

## 2017-11-03 ENCOUNTER — Inpatient Hospital Stay
Admission: EM | Admit: 2017-11-03 | Discharge: 2017-11-06 | DRG: 806 | Disposition: A | Discharge status: Home / Self Care | Payer: Medicaid Other | Attending: Obstetrics and Gynecology | Admitting: Obstetrics and Gynecology

## 2017-11-03 ENCOUNTER — Encounter: Payer: Self-pay | Admitting: *Deleted

## 2017-11-03 ENCOUNTER — Other Ambulatory Visit: Payer: Self-pay

## 2017-11-03 DIAGNOSIS — O9081 Anemia of the puerperium: Secondary | ICD-10-CM | POA: Diagnosis not present

## 2017-11-03 DIAGNOSIS — J069 Acute upper respiratory infection, unspecified: Secondary | ICD-10-CM | POA: Diagnosis present

## 2017-11-03 DIAGNOSIS — D62 Acute posthemorrhagic anemia: Secondary | ICD-10-CM | POA: Diagnosis not present

## 2017-11-03 DIAGNOSIS — O1495 Unspecified pre-eclampsia, complicating the puerperium: Secondary | ICD-10-CM | POA: Diagnosis not present

## 2017-11-03 DIAGNOSIS — O9952 Diseases of the respiratory system complicating childbirth: Secondary | ICD-10-CM | POA: Diagnosis present

## 2017-11-03 DIAGNOSIS — O99324 Drug use complicating childbirth: Secondary | ICD-10-CM

## 2017-11-03 DIAGNOSIS — F17213 Nicotine dependence, cigarettes, with withdrawal: Secondary | ICD-10-CM | POA: Diagnosis present

## 2017-11-03 DIAGNOSIS — O99334 Smoking (tobacco) complicating childbirth: Principal | ICD-10-CM | POA: Diagnosis present

## 2017-11-03 DIAGNOSIS — O09293 Supervision of pregnancy with other poor reproductive or obstetric history, third trimester: Secondary | ICD-10-CM

## 2017-11-03 DIAGNOSIS — Z3A39 39 weeks gestation of pregnancy: Secondary | ICD-10-CM

## 2017-11-03 DIAGNOSIS — F1210 Cannabis abuse, uncomplicated: Secondary | ICD-10-CM

## 2017-11-03 DIAGNOSIS — R000 Tachycardia, unspecified: Secondary | ICD-10-CM | POA: Diagnosis present

## 2017-11-03 DIAGNOSIS — O9089 Other complications of the puerperium, not elsewhere classified: Secondary | ICD-10-CM | POA: Diagnosis present

## 2017-11-03 DIAGNOSIS — Z3483 Encounter for supervision of other normal pregnancy, third trimester: Secondary | ICD-10-CM | POA: Diagnosis present

## 2017-11-03 DIAGNOSIS — R0602 Shortness of breath: Secondary | ICD-10-CM | POA: Diagnosis not present

## 2017-11-03 DIAGNOSIS — D72829 Elevated white blood cell count, unspecified: Secondary | ICD-10-CM | POA: Diagnosis not present

## 2017-11-03 LAB — COMPREHENSIVE METABOLIC PANEL
ALK PHOS: 301 U/L — AB (ref 38–126)
ALT: 29 U/L (ref 0–44)
AST: 39 U/L (ref 15–41)
Albumin: 2.7 g/dL — ABNORMAL LOW (ref 3.5–5.0)
Anion gap: 10 (ref 5–15)
BUN: 9 mg/dL (ref 6–20)
CALCIUM: 8.7 mg/dL — AB (ref 8.9–10.3)
CO2: 17 mmol/L — AB (ref 22–32)
Chloride: 105 mmol/L (ref 98–111)
Creatinine, Ser: 0.65 mg/dL (ref 0.44–1.00)
GFR calc Af Amer: >60 mL/min (ref >60)
GFR calc non Af Amer: >60 mL/min (ref >60)
Glucose, Bld: 137 mg/dL — ABNORMAL HIGH (ref 70–99)
Potassium: 4.3 mmol/L (ref 3.5–5.1)
SODIUM: 132 mmol/L — AB (ref 135–145)
Total Bilirubin: 0.5 mg/dL (ref 0.3–1.2)
Total Protein: 6.5 g/dL (ref 6.5–8.1)

## 2017-11-03 LAB — TYPE AND SCREEN
ABO/RH(D): O POS
Antibody Screen: NEGATIVE

## 2017-11-03 LAB — CHLAMYDIA/NGC RT PCR (ARMC ONLY)
Chlamydia Tr: NOT DETECTED
N gonorrhoeae: NOT DETECTED

## 2017-11-03 LAB — CBC
HEMATOCRIT: 37 % (ref 35.0–47.0)
HEMOGLOBIN: 12.5 g/dL (ref 12.0–16.0)
MCH: 31.3 pg (ref 26.0–34.0)
MCHC: 33.7 g/dL (ref 32.0–36.0)
MCV: 93.1 fL (ref 80.0–100.0)
Platelets: 258 10*3/uL (ref 150–440)
RBC: 3.98 MIL/uL (ref 3.80–5.20)
RDW: 13.5 % (ref 11.5–14.5)
WBC: 16.7 10*3/uL — AB (ref 3.6–11.0)

## 2017-11-03 LAB — PROTEIN / CREATININE RATIO, URINE
Creatinine, Urine: 83 mg/dL
PROTEIN CREATININE RATIO: 0.29 mg/mg{creat} — AB (ref 0.00–0.15)
Total Protein, Urine: 24 mg/dL

## 2017-11-03 LAB — URINE DRUG SCREEN, QUALITATIVE (ARMC ONLY)
AMPHETAMINES, UR SCREEN: NOT DETECTED
BENZODIAZEPINE, UR SCRN: NOT DETECTED
Cannabinoid 50 Ng, Ur ~~LOC~~: POSITIVE — AB
Cocaine Metabolite,Ur ~~LOC~~: NOT DETECTED
MDMA (ECSTASY) UR SCREEN: NOT DETECTED
METHADONE SCREEN, URINE: NOT DETECTED
OPIATE, UR SCREEN: NOT DETECTED
Phencyclidine (PCP) Ur S: NOT DETECTED
Tricyclic, Ur Screen: NOT DETECTED

## 2017-11-03 MED ORDER — ONDANSETRON HCL 4 MG/2ML IJ SOLN: 4.0000 mg | Freq: Four times a day (QID) | INTRAMUSCULAR | Status: DC | PRN

## 2017-11-03 MED ORDER — SENNOSIDES-DOCUSATE SODIUM 8.6-50 MG PO TABS
2.0000 | ORAL_TABLET | ORAL | Status: DC
  Administered 2017-11-04 – 2017-11-06 (×3): 2 via ORAL
  Filled 2017-11-03 (×3): qty 2

## 2017-11-03 MED ORDER — MISOPROSTOL 200 MCG PO TABS
ORAL_TABLET | ORAL | Status: AC
  Filled 2017-11-03: qty 4

## 2017-11-03 MED ORDER — BUTORPHANOL TARTRATE 1 MG/ML IJ SOLN: 1.0000 mg | INTRAMUSCULAR | Status: DC | PRN

## 2017-11-03 MED ORDER — WITCH HAZEL-GLYCERIN EX PADS: 1.0000 "application " | MEDICATED_PAD | CUTANEOUS | Status: DC | PRN

## 2017-11-03 MED ORDER — COCONUT OIL OIL: 1.0000 "application " | TOPICAL_OIL | Status: DC | PRN

## 2017-11-03 MED ORDER — LACTATED RINGERS IV SOLN: 500.0000 mL | INTRAVENOUS | Status: DC | PRN

## 2017-11-03 MED ORDER — ACETAMINOPHEN 325 MG PO TABS
650.0000 mg | ORAL_TABLET | ORAL | Status: DC | PRN
  Administered 2017-11-03 – 2017-11-05 (×6): 650 mg via ORAL
  Filled 2017-11-03 (×3): qty 2

## 2017-11-03 MED ORDER — DIPHENHYDRAMINE HCL 25 MG PO CAPS
25.0000 mg | ORAL_CAPSULE | Freq: Four times a day (QID) | ORAL | Status: DC | PRN
  Administered 2017-11-03: 25 mg via ORAL
  Filled 2017-11-03: qty 1

## 2017-11-03 MED ORDER — ACETAMINOPHEN 325 MG PO TABS
650.0000 mg | ORAL_TABLET | ORAL | Status: DC | PRN
  Filled 2017-11-03 (×3): qty 2

## 2017-11-03 MED ORDER — IBUPROFEN 600 MG PO TABS
600.0000 mg | ORAL_TABLET | Freq: Four times a day (QID) | ORAL | Status: DC
  Administered 2017-11-03 – 2017-11-06 (×12): 600 mg via ORAL
  Filled 2017-11-03 (×12): qty 1

## 2017-11-03 MED ORDER — DIBUCAINE 1 % RE OINT: 1.0000 "application " | TOPICAL_OINTMENT | RECTAL | Status: DC | PRN

## 2017-11-03 MED ORDER — LABETALOL HCL 200 MG PO TABS
200.0000 mg | ORAL_TABLET | Freq: Three times a day (TID) | ORAL | Status: DC
Start: 2017-11-03 — End: 2017-11-04
  Administered 2017-11-03: 200 mg via ORAL
  Filled 2017-11-03: qty 1

## 2017-11-03 MED ORDER — OXYCODONE-ACETAMINOPHEN 5-325 MG PO TABS: 2.0000 | ORAL_TABLET | ORAL | Status: DC | PRN

## 2017-11-03 MED ORDER — OXYTOCIN 40 UNITS IN LACTATED RINGERS INFUSION - SIMPLE MED
2.5000 [IU]/h | INTRAVENOUS | Status: DC
  Filled 2017-11-03: qty 1000

## 2017-11-03 MED ORDER — OXYTOCIN 10 UNIT/ML IJ SOLN
10.0000 [IU] | Freq: Once | INTRAMUSCULAR | Status: AC
  Administered 2017-11-03: 10 [IU] via INTRAMUSCULAR

## 2017-11-03 MED ORDER — ONDANSETRON HCL 4 MG/2ML IJ SOLN: 4.0000 mg | INTRAMUSCULAR | Status: DC | PRN

## 2017-11-03 MED ORDER — BENZOCAINE-MENTHOL 20-0.5 % EX AERO
1.0000 "application " | INHALATION_SPRAY | CUTANEOUS | Status: DC | PRN
  Administered 2017-11-03: 1 via TOPICAL

## 2017-11-03 MED ORDER — OXYCODONE-ACETAMINOPHEN 5-325 MG PO TABS
1.0000 | ORAL_TABLET | ORAL | Status: DC | PRN
  Administered 2017-11-03 – 2017-11-04 (×3): 1 via ORAL
  Filled 2017-11-03 (×3): qty 1

## 2017-11-03 MED ORDER — PRENATAL MULTIVITAMIN CH
1.0000 | ORAL_TABLET | Freq: Every day | ORAL | Status: DC
  Administered 2017-11-03 – 2017-11-06 (×4): 1 via ORAL
  Filled 2017-11-03 (×4): qty 1

## 2017-11-03 MED ORDER — AMMONIA AROMATIC IN INHA
RESPIRATORY_TRACT | Status: AC
  Filled 2017-11-03: qty 10

## 2017-11-03 MED ORDER — BENZOCAINE-MENTHOL 20-0.5 % EX AERO
INHALATION_SPRAY | CUTANEOUS | Status: AC
  Administered 2017-11-03: 1 via TOPICAL
  Filled 2017-11-03: qty 56

## 2017-11-03 MED ORDER — OXYTOCIN 10 UNIT/ML IJ SOLN
INTRAMUSCULAR | Status: AC
  Administered 2017-11-03: 10 [IU] via INTRAMUSCULAR
  Filled 2017-11-03: qty 2

## 2017-11-03 MED ORDER — LACTATED RINGERS IV SOLN
INTRAVENOUS | Status: DC
  Administered 2017-11-03: 08:00:00 via INTRAVENOUS

## 2017-11-03 MED ORDER — ONDANSETRON HCL 4 MG PO TABS: 4.0000 mg | ORAL_TABLET | ORAL | Status: DC | PRN

## 2017-11-03 MED ORDER — PNEUMOCOCCAL VAC POLYVALENT 25 MCG/0.5ML IJ INJ
0.5000 mL | INJECTION | INTRAMUSCULAR | Status: DC
  Filled 2017-11-03: qty 0.5

## 2017-11-03 MED ORDER — SIMETHICONE 80 MG PO CHEW: 80.0000 mg | CHEWABLE_TABLET | ORAL | Status: DC | PRN

## 2017-11-03 MED ORDER — OXYTOCIN BOLUS FROM INFUSION: 500.0000 mL | Freq: Once | INTRAVENOUS | Status: DC

## 2017-11-03 MED ORDER — LIDOCAINE HCL (PF) 1 % IJ SOLN
30.0000 mL | INTRAMUSCULAR | Status: AC | PRN
  Administered 2017-11-03: 30 mL via SUBCUTANEOUS
  Filled 2017-11-03: qty 30

## 2017-11-03 MED ORDER — VARICELLA VIRUS VACCINE LIVE 1350 PFU/0.5ML IJ SUSR: 0.5000 mL | INTRAMUSCULAR | Status: DC | PRN

## 2017-11-03 NOTE — Progress Notes (Signed)
Discussed increased pulse rates and BPs with Dr. Jerene PitchSchuman.  Orders for labs and EKG received.  MD in to assess patient. Reynold BowenSusan Paisley Cabria Micalizzi, RN 11/03/2017 7:08 PM

## 2017-11-03 NOTE — H&P (Signed)
OB History & Physical   History of Present Illness:  Chief Complaint: Contractions  HPI:  Susan [name] is a 22 y.o. G30P2001 female at [redacted]w[redacted]d dated by LMP.  Her pregnancy has been complicated by elevated ALT, chronic headaches, tobacco abuse, marijuana abuse, history of preeclampsia, history of SIDS with G1, elevated 1 hour gtt with normal 3 hour gtt.    She reports contractions beginning about 5 am this morning. She was able to go back to sleep for a short time and then they began to get closer and stronger. She arrived to the hospital in active labor at 7 cm dilated.   She denies leakage of fluid.   She denies vaginal bleeding.   She reports fetal movement.    Maternal Medical History:   Past Medical History:  Diagnosis Date  . ADHD (attention deficit hyperactivity disorder)   . Depression   . Preeclampsia, severe, third trimester    HELLP Syndrome    Past Surgical History:  Procedure Laterality Date  . NO PAST SURGERIES      No Known Allergies  Prior to Admission medications   Medication Sig Start Date End Date Taking? Authorizing Provider  diphenhydrAMINE HCl (BENADRYL PO) Take by mouth.   Yes [provider]  omeprazole (PRILOSEC) 20 MG capsule Take 1 capsule (20 mg total) by mouth daily. 09/22/17  Yes Vena Austria, MD  ondansetron (ZOFRAN-ODT) 4 MG disintegrating tablet DIS 1 T ON THE TONGUE Q 6 H PRF NAUSEA 10/02/17  Yes [provider]  Prenat-FeFum-FePo-FA-Omega 3 (CONCEPT DHA) 53.5-38-1 MG CAPS TK 1 C PO D. 10/13/17  Yes [provider]  amoxicillin-clavulanate (AUGMENTIN) 875-125 MG tablet Take 1 tablet by mouth 2 (two) times daily. Patient not taking: Reported on 10/30/2017 10/12/17   Kem Boroughs B, FNP  ciprofloxacin-dexamethasone (CIPRODEX) OTIC suspension 4 drops 2 (two) times daily.    [provider]  loratadine (CLARITIN) 10 MG tablet Take 1 tablet (10 mg total) by mouth daily. Patient not taking: Reported on 10/30/2017  10/16/17   Tresea Mall, CNM  terconazole (TERAZOL 7) 0.4 % vaginal cream Place 1 applicator vaginally at bedtime. Patient not taking: Reported on 11/03/2017 10/22/17   Nadara Mustard, MD    OB History  Gravida Para Term Preterm AB Living  2 2 2     1   SAB TAB Ectopic Multiple Live Births        0 2    # Outcome Date GA Lbr Len/2nd Weight Sex Delivery Anes PTL Lv  2 Term 11/03/17 [redacted]w[redacted]d / 00:13 7 lb 13.2 oz (3.55 kg) F Vag-Spont   LIV  1 Term 02/02/16 [redacted]w[redacted]d  7 lb 5 oz (3.317 kg) M   N DEC     Complications: Preeclampsia, severe, third trimester    Prenatal care site: [facility]  Social History: She  reports that she has been smoking cigarettes.  She has never used smokeless tobacco. She reports that she drank alcohol. She reports that she has current or past drug history. Drug: Marijuana.  Family History: family history includes SIDS in her son.    Review of Systems: Negative x 10 systems reviewed except as noted in the HPI.    Physical Exam:  Vital Signs: BP (!) 141/88   Pulse (!) 117   Temp 97.7 F (36.5 C) (Oral)   Resp 20   Ht 5\' 5"  (1.651 m)   Wt 209 lb (94.8 kg)   LMP 02/01/2017 Comment: about a month ago.  has no idea.  home preg tested positive  BMI 34.78 kg/m  Constitutional: Well nourished, well developed female in no acute distress.  HEENT: normal Skin: Warm and dry.  Cardiovascular: Regular rate and rhythm.   Extremity: trace edema  Respiratory: Clear to auscultation bilateral. Normal respiratory effort Abdomen: FHT present Back: no CVAT Neuro: DTRs 2+, Cranial nerves grossly intact Psych: Alert and Oriented x3. No memory deficits. Normal mood and affect.  MS: normal gait, normal bilateral lower extremity ROM/strength/stability.  Pelvic exam:  is not limited by body habitus EGBUS: within normal limits Vagina: within normal limits and with normal mucosa  Cervix: 7/80/-1 on arrival to L&D   Pertinent Results:  Prenatal Labs: Blood type/Rh O  positive  Antibody screen negative  Rubella Immune  Varicella Not immune    RPR Non-Reactive  HBsAg negative  HIV negative  GC negative  Chlamydia negative  Genetic screening Not done  1 hour GTT 167  3 hour GTT 3/4 values wnl  GBS negative on 6/7   Baseline FHR: 125 beats/min   Variability: moderate   Accelerations: present   Decelerations: absent Contractions: present frequency: every 2-3 minutes Overall assessment: reassuring    Assessment:  Susan [name] is a 22 y.o. 522P2001 female at 7480w2d with active labor.   Plan:  1. Admit to Labor & Delivery  2. CBC, T&S, Clrs, IVF 3. GBS negative.   4. Fetal well-being: Category I 5. Expectant management  Tresea MallJane Raevyn Sokol, CNM 11/03/2017 11:34 AM

## 2017-11-03 NOTE — Progress Notes (Signed)
Patient ID: Susan [name], female   DOB: 10/25/1995, 22 y.o.   MRN: [id number]  CHIEF COMPLAINT:  No chief complaint on file.  Subjective  Patient is resting comfortably. She reports that she has had a mild headache which has come and gone for 3-4 days. She denies shortness of breath. Denies vision changes, denies right upper quadrant pain. Denies swelling.   Objective   Examination:  General exam: Appears calm and comfortable  Respiratory system: Clear to auscultation. Respiratory effort normal. HEENT: [place]/AT, PERRLA, no thrush, no stridor. Cardiovascular system: S1 & S2 heard, RRR. No JVD, murmurs, rubs, gallops or clicks. No pedal edema. Gastrointestinal system: Abdomen is nondistended, soft and nontender. No organomegaly or masses felt. Normal bowel sounds heard. Central nervous system: Alert and oriented. No focal neurological deficits. Extremities: Symmetric 5 x 5 power. Skin: No rashes, lesions or ulcers Psychiatry: Judgement and insight appear normal. Mood & affect appropriate.   VITALS:  height is 5\' 5"  (1.651 m) and weight is 209 lb (94.8 kg). Her oral temperature is 98 F (36.7 C). Her blood pressure is 136/90 and her pulse is 113 (abnormal). Her respiration is 20 and oxygen saturation is 98%.   I personally reviewed Labs under Results section.  Radiology Reports No results found.  Assessment/Plan:  22 yo s/p normal vaginal delivery, postpartum day #0 1. Sinus tachycardia- review of the EMR shows that the patient has had persistent sinus tachycardia at multiple visits for more than a year. ECG shows sinus tachycardia with signs of atrial enlargement, suggesting this may be a chronic problem. Will consult cardiology, she will need outpatient follow up and ECHO. Will start labetalol 200 mg TID for rate control. 2. Elevated blood pressure, history of HELLP, reports mild headache for several days. Protein creatinine ratio is 0.29 which is just below the recommended threshold  for diagnosis.  Will treat as preeclamptic. Blood pressures are not severe range, no magnesium at this time. Labetalol should also help with blood pressure control.  3. Continue normal postpartum care.    LOS: 0 days   Christanna R Schuman

## 2017-11-03 NOTE — Discharge Summary (Signed)
OB Discharge Summary     Patient Name: Susan [name] DOB: 10/24/1995 MRN: [id number]  Date of admission: 11/03/2017 Delivering Provider: Oswaldo ConroyJacelyn Y Schmid, CNM  Date of Delivery: 11/03/2017  Date of discharge: 11/06/2017  Admitting diagnosis: 39 weeks;contractions Intrauterine pregnancy: 5762w2d     Secondary diagnosis: None     Discharge diagnosis: Term Pregnancy Delivered, postpartum preeclampsia                         Hospital course:  Onset of Labor With Vaginal Delivery     22 y.o. yo G2P1000 at 2762w2d was admitted in Active Labor on 11/03/2017. Patient had an uncomplicated labor course as follows:  Membrane Rupture Time/Date: 8:30 AM ,11/03/2017   Intrapartum Procedures: Episiotomy:                                           Lacerations:     Patient had a delivery of a Viable infant. 11/03/2017  Information for the patient's newborn:  Ross Marcuselson, Girl Gagandeep [[id number]][[id number]]  Delivery Method: Vag-Spont    Pateint had an uncomplicated postpartum course.  She is ambulating, tolerating a regular diet, passing flatus, and urinating well. Patient is discharged home in stable condition on 11/06/17.                                                                  Post partum procedures:IV magnesium sulfate  Complications: None  Physical exam on 11/06/2017: Vitals:   11/05/17 2118 11/05/17 2314 11/06/17 0306 11/06/17 0851  BP: (!) 152/80 (!) 147/84 137/83 118/69  Pulse: (!) 115 (!) 106 (!) 105 (!) 109  Resp: 20 18 18 18   Temp: (!) 97.5 F (36.4 C) 98.2 F (36.8 C) 98.2 F (36.8 C) 98.3 F (36.8 C)  TempSrc: Oral Oral Oral Oral  SpO2: 98% 98%  98%  Weight:      Height:       General: alert, cooperative and no distress Lochia: appropriate Uterine Fundus: firm Incision: N/A DVT Evaluation: No evidence of DVT seen on physical exam.  Labs: Lab Results  Component Value Date   WBC 16.7 (H) 11/04/2017   HGB 10.7 (L) 11/04/2017   HCT 30.5 (L) 11/04/2017   MCV 92.6 11/04/2017   PLT  213 11/04/2017   CMP Latest Ref Rng & Units 11/04/2017  Glucose 70 - 99 mg/dL 98  BUN 6 - 20 mg/dL 15  Creatinine 7.820.44 - 9.561.00 mg/dL 2.130.64  Sodium 086135 - 578145 mmol/L 134(L)  Potassium 3.5 - 5.1 mmol/L 4.0  Chloride 98 - 111 mmol/L 106  CO2 22 - 32 mmol/L 19(L)  Calcium 8.9 - 10.3 mg/dL 4.6(N8.4(L)  Total Protein 6.5 - 8.1 g/dL 6.2(X5.6(L)  Total Bilirubin 0.3 - 1.2 mg/dL 0.4  Alkaline Phos 38 - 126 U/L 239(H)  AST 15 - 41 U/L 35  ALT 0 - 44 U/L 30    Discharge instruction: per After Visit Summary.  Medications:  Allergies as of 11/06/2017   No Known Allergies     Medication List    STOP taking these medications   amoxicillin-clavulanate 875-125 MG tablet Commonly known as:  AUGMENTIN   BENADRYL PO   ciprofloxacin-dexamethasone OTIC suspension Commonly known as:  CIPRODEX   loratadine 10 MG tablet Commonly known as:  CLARITIN   omeprazole 20 MG capsule Commonly known as:  PRILOSEC   ondansetron 4 MG disintegrating tablet Commonly known as:  ZOFRAN-ODT   terconazole 0.4 % vaginal cream Commonly known as:  TERAZOL 7     TAKE these medications   CONCEPT DHA 53.5-38-1 MG Caps TK 1 C PO D.   labetalol 200 MG tablet Commonly known as:  NORMODYNE Take 1 tablet (200 mg total) by mouth 3 (three) times daily.       Diet: routine diet  Activity: Advance as tolerated. Pelvic rest for 6 weeks.   Outpatient follow up: Follow-up Information    Oswaldo Conroy, CNM. Go in 3 day(s).   Specialty:  Certified Nurse Midwife Why:  blood pressure/heart rate check Contact information: [address] [place] [address] [phone]             Postpartum contraception: Depo Provera Rhogam Given postpartum: no Rubella vaccine given postpartum: no Varicella vaccine given postpartum: yes TDaP given antepartum or postpartum: Yes, antepartum 09/04/2017  Newborn Data: Live born female  Birth Weight: 3550 g  APGAR: 8, 9  Newborn Delivery   Birth date/time:  11/03/2017  08:43:00 Delivery type:  Vaginal, Spontaneous      Baby Feeding: Formula  Disposition:home with mother  SIGNED:  Tresea Mall, CNM 11/06/2017 11:19 AM

## 2017-11-04 ENCOUNTER — Inpatient Hospital Stay: Payer: Medicaid Other

## 2017-11-04 ENCOUNTER — Inpatient Hospital Stay (HOSPITAL_COMMUNITY)
Admit: 2017-11-04 | Discharge: 2017-11-04 | Disposition: A | Discharge status: Home / Self Care | Payer: Medicaid Other | Attending: Obstetrics and Gynecology | Admitting: Obstetrics and Gynecology

## 2017-11-04 DIAGNOSIS — R0602 Shortness of breath: Secondary | ICD-10-CM

## 2017-11-04 DIAGNOSIS — R000 Tachycardia, unspecified: Secondary | ICD-10-CM

## 2017-11-04 DIAGNOSIS — D72829 Elevated white blood cell count, unspecified: Secondary | ICD-10-CM

## 2017-11-04 LAB — CBC
HCT: 30.5 % — ABNORMAL LOW (ref 35.0–47.0)
Hemoglobin: 10.7 g/dL — ABNORMAL LOW (ref 12.0–16.0)
MCH: 32.5 pg (ref 26.0–34.0)
MCHC: 35.1 g/dL (ref 32.0–36.0)
MCV: 92.6 fL (ref 80.0–100.0)
PLATELETS: 213 10*3/uL (ref 150–440)
RBC: 3.29 MIL/uL — AB (ref 3.80–5.20)
RDW: 13.7 % (ref 11.5–14.5)
WBC: 16.7 10*3/uL — ABNORMAL HIGH (ref 3.6–11.0)

## 2017-11-04 LAB — ECHOCARDIOGRAM COMPLETE
Height: 65 in
WEIGHTICAEL: 3344 [oz_av]

## 2017-11-04 LAB — BASIC METABOLIC PANEL
Anion gap: 9 (ref 5–15)
BUN: 15 mg/dL (ref 6–20)
CALCIUM: 8.4 mg/dL — AB (ref 8.9–10.3)
CO2: 19 mmol/L — ABNORMAL LOW (ref 22–32)
Chloride: 106 mmol/L (ref 98–111)
Creatinine, Ser: 0.64 mg/dL (ref 0.44–1.00)
GFR calc Af Amer: >60 mL/min (ref >60)
GLUCOSE: 98 mg/dL (ref 70–99)
POTASSIUM: 4 mmol/L (ref 3.5–5.1)
Sodium: 134 mmol/L — ABNORMAL LOW (ref 135–145)

## 2017-11-04 LAB — HEPATIC FUNCTION PANEL
ALBUMIN: 2.2 g/dL — AB (ref 3.5–5.0)
ALK PHOS: 239 U/L — AB (ref 38–126)
ALT: 30 U/L (ref 0–44)
AST: 35 U/L (ref 15–41)
BILIRUBIN TOTAL: 0.4 mg/dL (ref 0.3–1.2)
Total Protein: 5.6 g/dL — ABNORMAL LOW (ref 6.5–8.1)

## 2017-11-04 LAB — RPR: RPR: NONREACTIVE

## 2017-11-04 LAB — T4, FREE: FREE T4: 0.6 ng/dL — AB (ref 0.82–1.77)

## 2017-11-04 LAB — TSH: TSH: 8.398 u[IU]/mL — AB (ref 0.350–4.500)

## 2017-11-04 MED ORDER — IOPAMIDOL (ISOVUE-370) INJECTION 76%
100.0000 mL | Freq: Once | INTRAVENOUS | Status: AC | PRN
  Administered 2017-11-04: 100 mL via INTRAVENOUS

## 2017-11-04 MED ORDER — MAGNESIUM SULFATE BOLUS VIA INFUSION
4.0000 g | Freq: Once | INTRAVENOUS | Status: AC
  Administered 2017-11-04: 4 g via INTRAVENOUS
  Filled 2017-11-04: qty 500

## 2017-11-04 MED ORDER — SODIUM CHLORIDE 0.9 % IV BOLUS
1000.0000 mL | Freq: Once | INTRAVENOUS | Status: AC
  Administered 2017-11-04: 1000 mL via INTRAVENOUS

## 2017-11-04 MED ORDER — MAGNESIUM SULFATE 40 G IN LACTATED RINGERS - SIMPLE
2.0000 g/h | INTRAVENOUS | Status: DC
  Administered 2017-11-04 – 2017-11-05 (×2): 2 g/h via INTRAVENOUS
  Filled 2017-11-04 (×3): qty 500

## 2017-11-04 MED ORDER — LABETALOL HCL 5 MG/ML IV SOLN
20.0000 mg | INTRAVENOUS | Status: DC | PRN
  Filled 2017-11-04: qty 16

## 2017-11-04 MED ORDER — FLUTICASONE PROPIONATE 50 MCG/ACT NA SUSP
2.0000 | Freq: Every day | NASAL | Status: DC
  Administered 2017-11-04 – 2017-11-06 (×3): 2 via NASAL
  Filled 2017-11-04: qty 16

## 2017-11-04 MED ORDER — LABETALOL HCL 200 MG PO TABS
200.0000 mg | ORAL_TABLET | Freq: Three times a day (TID) | ORAL | Status: DC
  Administered 2017-11-04 – 2017-11-06 (×5): 200 mg via ORAL
  Filled 2017-11-04 (×2): qty 2
  Filled 2017-11-04: qty 1
  Filled 2017-11-04: qty 2
  Filled 2017-11-04: qty 1

## 2017-11-04 MED ORDER — LABETALOL HCL 200 MG PO TABS
300.0000 mg | ORAL_TABLET | Freq: Three times a day (TID) | ORAL | Status: DC
  Administered 2017-11-04: 300 mg via ORAL
  Filled 2017-11-04: qty 1

## 2017-11-04 MED ORDER — LACTATED RINGERS IV SOLN
INTRAVENOUS | Status: DC
  Administered 2017-11-04 – 2017-11-05 (×2): via INTRAVENOUS

## 2017-11-04 MED ORDER — NICOTINE 14 MG/24HR TD PT24
14.0000 mg | MEDICATED_PATCH | Freq: Every day | TRANSDERMAL | Status: DC
  Administered 2017-11-04 – 2017-11-05 (×2): 14 mg via TRANSDERMAL
  Filled 2017-11-04 (×2): qty 1

## 2017-11-04 NOTE — Progress Notes (Signed)
Applied cardiac monitor and called central telemetry to begin cardiac monitoring.

## 2017-11-04 NOTE — Progress Notes (Signed)
Contacted [facility] Charge RN, Agricultural consultantCarmen. [facility]

## 2017-11-04 NOTE — Progress Notes (Signed)
Post Partum Day 1 Subjective: Denies headaches, SOB, chest pain. Has some nasal congestion and a productive cough. Feeling some withdrawal symptoms from smoking.  Bottle feeding. Has not had much sleep.  Objective: Blood pressure 130/81, pulse (!) 108, temperature 98.2 F (36.8 C), temperature source Oral, resp. rate 18, height 5\' 5"  (1.651 m), weight 94.8 kg (209 lb), last menstrual period 02/01/2017, SpO2 99 %, unknown if currently breastfeeding. Patient Vitals for the past 24 hrs:  BP Temp Temp src Pulse Resp SpO2  11/04/17 0828 130/81 98.2 F (36.8 C) Oral (!) 108 18 99 %  11/04/17 0433 (!) 157/92 98 F (36.7 C) Oral (!) 110 20 97 %  11/04/17 0023 134/88 97.8 F (36.6 C) Oral (!) 114 20 -  11/03/17 2211 (!) 146/78 97.9 F (36.6 C) Oral (!) 106 20 -  11/03/17 2013 (!) 152/93 97.6 F (36.4 C) Oral (!) 108 20 -  11/03/17 1650 136/90 98 F (36.7 C) Oral (!) 113 20 -  11/03/17 1326 (!) 159/89 98.2 F (36.8 C) Oral (!) 119 18 98 %  11/03/17 1226 (!) 148/90 98.1 F (36.7 C) Oral (!) 117 20 98 %  Was started on labetalol 200 mgm last night at 2052 when BP 152/93 and it was increased to 300 mgm this AM tid. There was concern for her tachycardia and cardiology was consulted. EKG was done and revealed sinus tachycardia, right axis deviation and left atrial enlargement. Possible prior anterior infarct likely related to lead placement and age. Echo was normal with a 65-70% EF. Cardiologist recommended liter bolus of saline to see if tachycardia would resolve.  Recommends  Further workup for PE if tachycardia continues. TSH was also a little elevated at 8.398, but LFTs yesterday and BUN and creatinine as well as platelet count today are normal. PC ratio yesterday was 290 mgm. Physical Exam:  General: alert, cooperative and no distress, sitting up watching baby Heart: tachycardia, no murmurs Lungs: inspiratory wheezing, no increased effort with breathing Lochia: appropriate Uterine Fundus:  firm/ U-1 to U-2/ ML/ NT  DVT Evaluation: No evidence of DVT seen on physical exam. Edema bilateral lower extremities  Neuro: alert, oriented, answering questions appropriately DTRs: +1  Recent Labs    11/03/17 0804 11/04/17 0432  HGB 12.5 10.7*  HCT 37.0 30.5*  WBC 16.7* 16.7*  PLT 258 213    Assessment/Plan: Hypertension postpartum: very possibly preeclampsia-severe range blood pressures may be masked with the labetalol. Hx of severe preeclampsia with first delivery. Spoke with Dr Bonney AidStaebler who recommends starting magnesium sulfate. Will transfer back to L&D and begin magnesium. Decreased dose of labetalol back to 200 mgm tid. Monitor urine output and blood pressures closely  Tachycardia: if tachycardia persists, will get CT of chest to rule out PE  Nicotine withdrawal: nicotine patch ordered  Nasal congestion: Flonase  Continue postpartum care and support baby care  O POS/ VNI/ RI-vaccinate with Varivax at time of discharge  TDAP UTD        LOS: 1 day   Farrel ConnersColleen Shawni Volkov 11/04/2017, 11:50 AM

## 2017-11-04 NOTE — Progress Notes (Signed)
*  PRELIMINARY RESULTS* Echocardiogram 2D Echocardiogram has been performed.  Susan [name] 11/04/2017, 9:33 AM

## 2017-11-04 NOTE — Progress Notes (Signed)
Due to issues with central cardiac monitoring being unable to access pt's room for cardiac monitoring, request for transfer was submitted and ordered by MD, however central cardiac monitoring is now able to access the patient's room, so transfer is not needed. This nurse awaiting telementry box for cardiac monitoring to be brought to unit.

## 2017-11-04 NOTE — Consult Note (Signed)
Cardiology Consultation:   Patient ID: Susan [name]; [id number]; 05-18-1995   Admit date: 11/03/2017 Date of Consult: 11/04/2017  Primary Care Provider: Patient, No Pcp Per Primary Cardiologist: New - End   Patient Profile:   Susan [name] is a 22 y.o. female with a hx of ADHD not currently on stimulant therapy, severe preeclampsia with her first pregnancy with HELLP syndrome, depression, SIDS in her first born son, headache disorder, and ongoing tobacco/marijuana abuse who is being seen today for the evaluation of sinus tachycardia at the request of Dr. Jerene Pitch.  History of Present Illness:   Susan [name] has not previously known cardiac history. She reports significant HTN during her first pregnancy around age 33-18 that was managed with antihypertensive medication. Following delivery of that baby her HTN improved and she was taken off medication. Her first born son did die from SIDS. She continues to smoke ~ 1-1.5 packs daily and has done so since ~ age 23. She has ongoing marijuana abuse.   Patient was admitted to [facility] on 6/25 with contractions. She ultimately delivered with the aid of NO. She suffered a 1st degree laceration. Post-partum she was noted to be tachycardic into the 110s to 120s bpm with associated SOB. She has noted significant nasal congestion and wheezing for about the past month. She did have some bilateral pedal edema during her pregnancy, otherwise she denies any lower extremity swelling, erythema, warmth, or tenderness. EKG on 6/25 shows sinus tachycardia as below. Labs show a HGB 10.7 with a baseline of 11-12, UDS positive for marijuana, Na 132-->134, K+ 4.3-->4.0, SCr 0.65, albumin 2.7, TSH 8.398. BP has been in the 110s to 150s systolic. Not on telemetry. She did not sleep much overnight. Continues to note nasal congestion and an nonproductive cough. No chest pain, palpitations, dizziness, presyncope, or syncope.   Past Medical History:  Diagnosis Date  .  ADHD (attention deficit hyperactivity disorder)   . Depression   . Preeclampsia, severe, third trimester    HELLP Syndrome    Past Surgical History:  Procedure Laterality Date  . NO PAST SURGERIES       Home Meds: Prior to Admission medications   Medication Sig Start Date End Date Taking? Authorizing Provider  diphenhydrAMINE HCl (BENADRYL PO) Take by mouth.   Yes [provider]  omeprazole (PRILOSEC) 20 MG capsule Take 1 capsule (20 mg total) by mouth daily. 09/22/17  Yes Vena Austria, MD  ondansetron (ZOFRAN-ODT) 4 MG disintegrating tablet DIS 1 T ON THE TONGUE Q 6 H PRF NAUSEA 10/02/17  Yes [provider]  Prenat-FeFum-FePo-FA-Omega 3 (CONCEPT DHA) 53.5-38-1 MG CAPS TK 1 C PO D. 10/13/17  Yes [provider]  amoxicillin-clavulanate (AUGMENTIN) 875-125 MG tablet Take 1 tablet by mouth 2 (two) times daily. Patient not taking: Reported on 10/30/2017 10/12/17   Kem Boroughs B, FNP  ciprofloxacin-dexamethasone (CIPRODEX) OTIC suspension 4 drops 2 (two) times daily.    [provider]  loratadine (CLARITIN) 10 MG tablet Take 1 tablet (10 mg total) by mouth daily. Patient not taking: Reported on 10/30/2017 10/16/17   Tresea Mall, CNM  terconazole (TERAZOL 7) 0.4 % vaginal cream Place 1 applicator vaginally at bedtime. Patient not taking: Reported on 11/03/2017 10/22/17   Nadara Mustard, MD    Inpatient Medications: Scheduled Meds: . ibuprofen  600 mg Oral Q6H  . labetalol  300 mg Oral TID  . oxytocin 40 units in LR 1000 mL  500 mL Intravenous Once  .  pneumococcal 23 valent vaccine  0.5 mL Intramuscular Tomorrow-1000  . prenatal multivitamin  1 tablet Oral Q1200  . senna-docusate  2 tablet Oral Q24H   Continuous Infusions: . lactated ringers    . lactated ringers Stopped (11/03/17 0845)  . oxytocin    . sodium chloride     PRN Meds: acetaminophen, acetaminophen, benzocaine-Menthol, butorphanol, coconut oil, witch hazel-glycerin **AND**  dibucaine, diphenhydrAMINE, lactated ringers, lidocaine (PF), ondansetron, ondansetron **OR** ondansetron (ZOFRAN) IV, oxyCODONE-acetaminophen, oxyCODONE-acetaminophen, simethicone, varicella virus vaccine live  Allergies:  No Known Allergies  Social History:   Social History   Socioeconomic History  . Marital status: Single    Spouse name: Not on file  . Number of children: 0  . Years of education: Not on file  . Highest education level: Not on file  Occupational History  . Not on file  Social Needs  . Financial resource strain: Not on file  . Food insecurity:    Worry: Not on file    Inability: Not on file  . Transportation needs:    Medical: Not on file    Non-medical: Not on file  Tobacco Use  . Smoking status: Current Every Day Smoker    Types: Cigarettes  . Smokeless tobacco: Never Used  . Tobacco comment: 2-3 cigarettes per day/trying to quit  Substance and Sexual Activity  . Alcohol use: Not Currently  . Drug use: Yes    Types: Marijuana  . Sexual activity: Not on file  Lifestyle  . Physical activity:    Days per week: Not on file    Minutes per session: Not on file  . Stress: Not on file  Relationships  . Social connections:    Talks on phone: Not on file    Gets together: Not on file    Attends religious service: Not on file    Active member of club or organization: Not on file    Attends meetings of clubs or organizations: Not on file    Relationship status: Not on file  . Intimate partner violence:    Fear of current or ex partner: Not on file    Emotionally abused: Not on file    Physically abused: Not on file    Forced sexual activity: Not on file  Other Topics Concern  . Not on file  Social History Narrative  . Not on file     Family History:   Family History  Problem Relation Age of Onset  . SIDS Son     ROS:  Review of Systems  Constitutional: Positive for malaise/fatigue. Negative for chills, diaphoresis, fever and weight loss.    HENT: Positive for congestion and sinus pain.   Eyes: Negative for discharge and redness.  Respiratory: Positive for cough, shortness of breath and wheezing. Negative for hemoptysis and sputum production.   Cardiovascular: Negative for chest pain, palpitations, orthopnea, claudication, leg swelling and PND.  Gastrointestinal: Positive for abdominal pain and melena. Negative for blood in stool, heartburn, nausea and vomiting.       Has noted black stools (on PNV)  Genitourinary: Negative for hematuria.  Musculoskeletal: Negative for falls and myalgias.  Skin: Negative for rash.  Neurological: Positive for weakness. Negative for dizziness, tingling, tremors, sensory change, speech change, focal weakness and loss of consciousness.  Endo/Heme/Allergies: Does not bruise/bleed easily.  Psychiatric/Behavioral: Positive for substance abuse. The patient is not nervous/anxious.   All other systems reviewed and are negative.     Physical Exam/Data:   Vitals:  11/03/17 2211 11/04/17 0023 11/04/17 0433 11/04/17 0828  BP: (!) 146/78 134/88 (!) 157/92 130/81  Pulse: (!) 106 (!) 114 (!) 110 (!) 108  Resp: 20 20 20 18   Temp: 97.9 F (36.6 C) 97.8 F (36.6 C) 98 F (36.7 C) 98.2 F (36.8 C)  TempSrc: Oral Oral Oral Oral  SpO2:   97% 99%  Weight:      Height:        Intake/Output Summary (Last 24 hours) at 11/04/2017 1025 Last data filed at 11/04/2017 1000 Gross per 24 hour  Intake 480 ml  Output 1280 ml  Net -800 ml   Filed Weights   11/03/17 0809  Weight: 209 lb (94.8 kg)   Body mass index is 34.78 kg/m.   Physical Exam: General: Well developed, well nourished, in no acute distress. Head: Normocephalic, atraumatic, sclera non-icteric, no xanthomas, nares without discharge.  Neck: Negative for carotid bruits. JVD not elevated. Lungs: Faint bilateral expiratory wheezing. Breathing is unlabored. Heart: Tachycardic with S1 S2. No murmurs, rubs, or gallops appreciated. Abdomen: Soft,  mild diffuse tenderness to palpation, non-distended with normoactive bowel sounds. No hepatomegaly. No rebound/guarding. No obvious abdominal masses. Msk:  Strength and tone appear normal for age. Extremities: No clubbing or cyanosis. No edema. Distal pedal pulses are 2+ and equal bilaterally. Neuro: Alert and oriented X 3. No facial asymmetry. No focal deficit. Moves all extremities spontaneously. Psych:  Responds to questions appropriately with a normal affect.   EKG:  The EKG was personally reviewed and demonstrates: sinus tachycardia, 115 bpm, right axis deviation, left atrial enlargement, rare PVC, poor R wave progression, possible prior anterior infarct likely related to lead placement and age Telemetry:  Telemetry was personally reviewed and demonstrates: not on telemetry  Weights: Filed Weights   11/03/17 0809  Weight: 209 lb (94.8 kg)    Relevant CV Studies: TTE 11/04/2017: Study Conclusions  - Left ventricle: The cavity size was normal. Wall thickness was   normal. Systolic function was vigorous. The estimated ejection   fraction was in the range of 65% to 70%. Wall motion was normal;   there were no regional wall motion abnormalities. Left   ventricular diastolic function parameters were normal. - Right ventricle: The cavity size was normal. Wall thickness was   normal. Systolic function was normal. - No significant valvular abnormalities.  Laboratory Data:  Chemistry Recent Labs  Lab 11/03/17 1331 11/04/17 0432  NA 132* 134*  K 4.3 4.0  CL 105 106  CO2 17* 19*  GLUCOSE 137* 98  BUN 9 15  CREATININE 0.65 0.64  CALCIUM 8.7* 8.4*  GFRNONAA >60 >60  GFRAA >60 >60  ANIONGAP 10 9    Recent Labs  Lab 11/03/17 1331  PROT 6.5  ALBUMIN 2.7*  AST 39  ALT 29  ALKPHOS 301*  BILITOT 0.5   Hematology Recent Labs  Lab 11/03/17 0804 11/04/17 0432  WBC 16.7* 16.7*  RBC 3.98 3.29*  HGB 12.5 10.7*  HCT 37.0 30.5*  MCV 93.1 92.6  MCH 31.3 32.5  MCHC 33.7  35.1  RDW 13.5 13.7  PLT 258 213   Cardiac EnzymesNo results for input(s): TROPONINI in the last 168 hours. No results for input(s): TROPIPOC in the last 168 hours.  BNPNo results for input(s): BNP, PROBNP in the last 168 hours.  DDimer No results for input(s): DDIMER in the last 168 hours.  Radiology/Studies:  No results found.  Assessment and Plan:   1. Sinus tachycardia/SOB: -Possibly multifactorial including mild  acute blood loss anemia from her recent vaginal delivery, pain, URI with increased congestion and wheezing  -Echo demonstrated preserved LVSF with an EF of 65-70%, normal wall motion, no significant valvular disease, normal RV cavity size, wall thickness, and RVSF -Will given a 1 L normal saline bolus -Place on telemetry -Check TSH, which has been found to be elevated, she will need dedicated thyroid studies ordered by the primary service with recommendation to manage accordingly  -If thyroid workup is unrevealing for hyperthyroidism and sinus tachycardia does not improve with normal saline bolus, would recommend primary service evaluate the patient further for possible PE (d-dimer less helpful given recent delivery)  2. HTN: -Patient reported HTN with her first pregnancy, though was able to come off antihypertensives following delivery -BP is reasonably controlled at this time -She will not be breast feeding -If needed, could use low dose beta blocker once wheezing clears  3. Possible URI: -May be exacerbating her SOB and to some extent her sinus tach -She has some nasal/sinus congestion and has mild wheezing on exam -Defer evaluation and treatment to primary service  4. History of SIDS in her son: -Recommend back to sleep in her daughter  -Defer to OB  5. Tobacco/marijuana abuse: -Complete cessation has been advised to the patient  6. Abnormal TSH: -Recommend dedicated thyroid studies to be ordered at the discretion of the primary service  -No recent TSH in  Epic  7. Dark stools: -On PNV -Consider further workup if indicated    For questions or updates, please contact [facility] Please consult [email or web address] for contact info under Cardiology/STEMI.   Signed, Eula Listenyan Less Woolsey, PA-C [facility] Pager: [phone] 11/04/2017, 10:25 AM

## 2017-11-04 NOTE — Clinical Social Work Note (Signed)
CSW received consult for "Mom and baby MJ positive". CSW noted in chart that patient has gone back to Labor and Delivery. CSW contacted patient's nurse and she states that patient will not be back in her room on Mother/Baby until much later today and may not be available to speak with until tomorrow. CSW will see patient tomorrow when she is available.   Ruthe Mannanandace Savaya Hakes MSW, [address]CSWA [phone]

## 2017-11-04 NOTE — Progress Notes (Signed)
    Rounded on the patient with Dr. Okey DupreEnd this afternoon. Telemetry shows the patient remains with sinus tachycardia ranging from the low 100s to 140s bpm. Asymptomatic. She was transferred back to L&D for administration of magnesium for HTN. Echo showed normal LVEF as detailed in her consult note. She was given a 1 L NS fluid bolus without improvement in her heart rate. She has labetalol 200 mg tid ordered, though has not received any to date. Patient will not be breast feeding, therefor alternative agents to magnesium should be considered for HTN unless this is being used for something. We will proceed with a CTA PE protocol to evaluate for PE given her recent pregnancy and persistent sinus tachycardia/intermediate probability (already ordered by OB). She does not have any known contrast allergy. Smoking cessation is again recommended. Some of her tachycardic rates may be related to pain, though I would not expect these rates to be into the 140s bpm just in the setting of pain. Pain management is deferred to primary service. Thyroid studies show hypothyroidism, defer to primary service/PCP.

## 2017-11-05 NOTE — Progress Notes (Signed)
Late L&D Note   S: "Why do I have to stay here if my scan was normal?"  O:  127/65, 128/67, 127/74, 139/83, 133/95, 149/85 Just received 200 mgm labetalol at 1700 Remained tachycardic at 108 to 118 with the HR going up to 130s when she was up to the bathroom. Urine out put since transfer to L&D has been 1400  CT scan was done because of continued tachycardia and that was normal. Explained to patient that there was no evidence of PE and lung infection, but that she did have pre eclampsia and tachycardia and that we need to monitor her and complete the course of magnesium for seizure prophylaxis She has poor insight into her health problems and need for treatment. Has the nicotine patch on which may add to her tachycardia Both her and her partner need some direction/teaching in caring for a newborn I.e. [place], feeding. Keeping warm. Advised not to sleep with baby in bed Labs: Free T4 returned low at 0.60 (normal .82 to 1.77) Liver enzymes WNL  A: Preeclampsia-blood pressures in the normal to mild range on labetalol 200 mgm tid Tachycardia Hypothyroid? Nicotine withdrawl  P: Continue magnesium for 24 hours (around noon tomorrow) Monitor blood pressures, heart rate and output closely Consulted Dr Bonney AidStaebler regarding hypothyroidism-hold levothyroxine for now Nicotine patch Social services consult pending Nursing support and teaching regarding her health care and newborn care.  Farrel Connersolleen Omarie Parcell, CNM

## 2017-11-05 NOTE — Clinical Social Work Maternal (Signed)
CLINICAL SOCIAL WORK MATERNAL/CHILD NOTE  Patient Details  Name: Susan [name] MRN: [id number] Date of Birth: August 08, 1995  Date:  11/05/2017  Clinical Social Worker Initiating Note:  Susan [name] Susan [name], Susan [name] [phone]) Date/Time: Initiated:  11/05/17/1550     Child's Name:  Susan [name] )   Biological Parents:  Mother, Father   Need for Interpreter:  None   Reason for Referral:  Current Substance Use/Substance Use During Pregnancy    Address:  [place] [address]    Phone number:  [phone] (home)     Additional phone number:  Household Members/Support Persons (HM/SP):   Household Member/Support Person 1, Household Member/Support Person 2   HM/SP Name Relationship DOB or Age  HM/SP -1 Aeronautical engineer ) (Significant other )    HM/SP -2 (Susan [name] ) (Aunt )    HM/SP -3        HM/SP -4        HM/SP -5        HM/SP -6        HM/SP -7        HM/SP -8          Natural Supports (not living in the home):  Extended Family, Immediate Family   Professional Supports:     Employment: Unemployed   Type of Work:     Education:  Programmer, systems   Homebound arranged:    Museum/gallery curator Resources:  Medicaid   Other Resources:  [facility]   Cultural/Religious Considerations Which May Impact Care:   Strengths:  Compliance with medical plan , Home prepared for child    Psychotropic Medications:         Pediatrician:       Pediatrician List:   [facility]      Pediatrician Fax Number:    Risk Factors/Current Problems:  Substance Use    Cognitive State:  Poor Insight , Poor Judgement    Mood/Affect:  Relaxed    CSW Assessment: Clinical Education officer, museum (CSW) received consult that mother and infant are positive for marijuana. Per nurse mother appears to have a low IQ and had infant wrapped up with a blanket covering her face and the tempeture of the room too  high. CSW met with mother and father of the baby Susan [name] was at bedside. CSW asked Susan [name] to step out during assessment and he agreed to do so. CSW met with mother alone to complete assessment. Per mother this is her and Susan [name]'s second child. Their first child passed away from SIDS at 74 months old per mother. Mother reported that she lives with Susan [name], her aunt Susan [name] and 2 little nephews live in the home as well. Mother reported that she used marijuana while pregnant because it helped her eat and sleep. Mother denied alcohol and other drug use. Mother reported that she has all the baby supplies needed including a car seat. Per mother she has [place] and medicaid. Per mother she does not work and is going to stay at home with the infant for at least a year. Per mother Susan [name] will work and support mother and infant financially. CSW provided mother with a list of [place] resources including [facility] for substance abuse and mental health treatment. CSW educated mother on post partum depression. CSW made mother aware that a Child Protective Services (CPS) report will have to  be made because of infant's positive drug screen. Mother reported that CPS has been involved before because she used marijuana during her first pregnancy.   CSW made a CPS report to [place].    CSW Plan/Description:  CSW Awaiting CPS Disposition Plan, Child Protective Service Report     Susan [name], Susan [name] 11/05/2017, 3:55 PM

## 2017-11-05 NOTE — Progress Notes (Signed)
Magnesium Check Note  11/05/2017 - 12:17 PM  22 y.o. [id number].  Pregnancy complicated by preeclampsia with severe features.  Patient Active Problem List   Diagnosis Date Noted  . Labor and delivery, indication for care 11/03/2017  . Elevated glucose tolerance test 08/24/2017  . Tobacco smoking affecting pregnancy in third trimester 08/21/2017  . Family history of SIDS (sudden infant death syndrome) 05/26/2017  . Supervision of high risk pregnancy, antepartum 04/06/2017  . History of pre-eclampsia 04/06/2017  . Tobacco abuse 12/07/2016  . Marijuana abuse 12/07/2016  . Abnormal liver function tests 09/18/2016  . Chronic headaches 09/18/2016      Subjective:  Patient denies headache, denies visual disturbances,  denies chest pain, denies shortness of breath.  Objective:   Vitals:   11/05/17 0852 11/05/17 0952 11/05/17 1053 11/05/17 1203  BP: 117/71 (!) 109/55 (!) 122/54 (!) 104/43  Pulse: (!) 105 (!) 108 (!) 105 (!) 107  Resp: 19 (!) 27 17 19   Temp:      TempSrc:      SpO2:      Weight:      Height:        Current Vital Signs 24h Vital Sign Ranges  T 98.7 F (37.1 C) Temp  Avg: 98.5 F (36.9 C)  Min: 98.1 F (36.7 C)  Max: 98.7 F (37.1 C)  BP (!) 104/43 BP  Min: 87/36  Max: 154/76  HR (!) 107 Pulse  Avg: 109.3  Min: 98  Max: 125  RR 19 Resp  Avg: 19  Min: 12  Max: 27  SaO2 97 %   SpO2  Avg: 98 %  Min: 97 %  Max: 99 %       24 Hour I/O Current Shift I/O  Time Ins Outs 06/26 0701 - 06/27 0700 In: 1983.8 [P.O.:480; I.V.:1503.8] Out: 4000 [Urine:4000] 06/27 0701 - 06/27 1900 In: 150 [I.V.:150] Out: 300 [Urine:300]   Physical Exam: General: NAD Pulm: No increased WOB, very slight expiratory wheezes in bilateral lower lobes CV: Tachycardia, no murmurs, rubs, or gallops, on telemetry Abd: Soft, nontender, nondistended Ext: No signs of DVT Neuro: DTRs 1+ brachioradialis  Labs:  Recent Labs  Lab 11/03/17 0804 11/04/17 0432  WBC 16.7* 16.7*  HGB 12.5 10.7*   HCT 37.0 30.5*  PLT 258 213   Recent Labs  Lab 11/03/17 1331 11/04/17 0432 11/04/17 1224  NA 132* 134*  --   K 4.3 4.0  --   CL 105 106  --   CO2 17* 19*  --   BUN 9 15  --   CREATININE 0.65 0.64  --   CALCIUM 8.7* 8.4*  --   PROT 6.5  --  5.6*  BILITOT 0.5  --  0.4  ALKPHOS 301*  --  239*  ALT 29  --  30  AST 39  --  35  GLUCOSE 137* 98  --     Medications SCHEDULED MEDICATIONS  . fluticasone  2 spray Each Nare Daily  . ibuprofen  600 mg Oral Q6H  . labetalol  200 mg Oral TID  . nicotine  14 mg Transdermal Daily  . oxytocin 40 units in LR 1000 mL  500 mL Intravenous Once  . pneumococcal 23 valent vaccine  0.5 mL Intramuscular Tomorrow-1000  . prenatal multivitamin  1 tablet Oral Q1200  . senna-docusate  2 tablet Oral Q24H    MEDICATION INFUSIONS  . lactated ringers    . lactated ringers 50 mL/hr at 11/05/17 1100  .  magnesium sulfate 2 g/hr (11/05/17 0600)    PRN MEDICATIONS  acetaminophen, benzocaine-Menthol, coconut oil, witch hazel-glycerin **AND** dibucaine, diphenhydrAMINE, labetalol, lactated ringers, ondansetron **OR** ondansetron (ZOFRAN) IV, oxyCODONE-acetaminophen, simethicone, varicella virus vaccine live   Assessment & Plan:  22 y.o. G2P2001 without signs of magnesium toxicity 1) preeclampsia with severe features, postpartum: continue magnesum until 12 pm today 2) plan for discontinuation of magnesium 24 hours from time of delivery. 3) Continue postpartum care and monitor BP and heart rate.  Susan [name], CNM 11/05/2017  12:20 PM

## 2017-11-05 NOTE — Progress Notes (Signed)
     CTA chest was negative for PE. Patient's heart rate has briefly improved to the upper 90s bpm. Remains with sinus tachycardia into the 110s to 130s bpm. Escalate beta blocker as needed for BP and heart rate.

## 2017-11-06 ENCOUNTER — Encounter: Payer: Medicaid Other | Admitting: Maternal Newborn

## 2017-11-06 MED ORDER — VARICELLA VIRUS VACCINE LIVE 1350 PFU/0.5ML IJ SUSR
0.5000 mL | Freq: Once | INTRAMUSCULAR | Status: AC
  Administered 2017-11-06: 0.5 mL via SUBCUTANEOUS
  Filled 2017-11-06 (×2): qty 0.5

## 2017-11-06 MED ORDER — MEDROXYPROGESTERONE ACETATE 150 MG/ML IM SUSP
150.0000 mg | Freq: Once | INTRAMUSCULAR | Status: AC
  Administered 2017-11-06: 150 mg via INTRAMUSCULAR
  Filled 2017-11-06: qty 1

## 2017-11-06 MED ORDER — LABETALOL HCL 200 MG PO TABS: 200.0000 mg | ORAL_TABLET | Freq: Three times a day (TID) | ORAL | 1 refills | Status: DC

## 2017-11-06 NOTE — Progress Notes (Signed)
Patient discharged home with infant. Discharge instructions and prescriptions given and reviewed with patient. Patient verbalized understanding. Escorted out by auxillary.  

## 2017-11-06 NOTE — Progress Notes (Signed)
  November 06, 2017  Patient: Konrad PentaHeather Fischetti     Date of Visit: 11/03/2017    To Whom It May Concern:  Rebeca AllegraChristopher Wilson was at the hospital from 11/03/17-11/06/17 for the birth of his child.   Sincerely,   [facility]

## 2017-11-06 NOTE — Progress Notes (Signed)
CPS worker Dewitt Dixon came to visit mother today. Per CPS worker mother and infant can D/C home today. RN aware of above. Please reconsult if future social work needs arise. CSW signing off.   Baker Hughes IncorporatedBailey Chanda Laperle, LCSW [phone]

## 2017-11-06 NOTE — Progress Notes (Signed)
Spoke w/ CPS worker who stated patient and baby are cleared to go home and will be in contact with Bailey Sample LCSW when he gets back to his office today. Providers updated patient can be discharged.  

## 2017-11-06 NOTE — Progress Notes (Signed)
Case has been accepted by Child Protective Services (CPS). CPS worker is PsychiatristDewitt Dixon [phone]. CSW made CPS worker aware that mother and infant are ready for discharge today. Per CPS worker [facility] 10[facility]30 am and 11:30 am. Per CPS worker he will follow up with CSW after meeting mother. RN aware of above.   Baker Hughes IncorporatedBailey Quadasia Newsham, LCSW [phone]

## 2017-11-08 ENCOUNTER — Emergency Department
Admission: EM | Admit: 2017-11-08 | Discharge: 2017-11-08 | Disposition: A | Discharge status: Home / Self Care | Payer: Medicaid Other | Attending: Emergency Medicine | Admitting: Emergency Medicine

## 2017-11-08 ENCOUNTER — Other Ambulatory Visit: Payer: Self-pay

## 2017-11-08 DIAGNOSIS — Z79899 Other long term (current) drug therapy: Secondary | ICD-10-CM | POA: Diagnosis not present

## 2017-11-08 DIAGNOSIS — R42 Dizziness and giddiness: Secondary | ICD-10-CM | POA: Insufficient documentation

## 2017-11-08 DIAGNOSIS — F17210 Nicotine dependence, cigarettes, uncomplicated: Secondary | ICD-10-CM | POA: Diagnosis not present

## 2017-11-08 DIAGNOSIS — R109 Unspecified abdominal pain: Secondary | ICD-10-CM | POA: Diagnosis present

## 2017-11-08 DIAGNOSIS — K649 Unspecified hemorrhoids: Secondary | ICD-10-CM

## 2017-11-08 DIAGNOSIS — K6289 Other specified diseases of anus and rectum: Secondary | ICD-10-CM | POA: Diagnosis not present

## 2017-11-08 LAB — CBC
HEMATOCRIT: 31.7 % — AB (ref 35.0–47.0)
HEMOGLOBIN: 10.7 g/dL — AB (ref 12.0–16.0)
MCH: 31.6 pg (ref 26.0–34.0)
MCHC: 33.8 g/dL (ref 32.0–36.0)
MCV: 93.6 fL (ref 80.0–100.0)
Platelets: 394 10*3/uL (ref 150–440)
RBC: 3.39 MIL/uL — AB (ref 3.80–5.20)
RDW: 14.4 % (ref 11.5–14.5)
WBC: 12.9 10*3/uL — AB (ref 3.6–11.0)

## 2017-11-08 LAB — BASIC METABOLIC PANEL
ANION GAP: 8 (ref 5–15)
BUN: 19 mg/dL (ref 6–20)
CO2: 22 mmol/L (ref 22–32)
Calcium: 9.4 mg/dL (ref 8.9–10.3)
Chloride: 108 mmol/L (ref 98–111)
Creatinine, Ser: 0.62 mg/dL (ref 0.44–1.00)
GFR calc non Af Amer: >60 mL/min (ref >60)
Glucose, Bld: 117 mg/dL — ABNORMAL HIGH (ref 70–99)
POTASSIUM: 4 mmol/L (ref 3.5–5.1)
SODIUM: 138 mmol/L (ref 135–145)

## 2017-11-08 LAB — LIPASE, BLOOD: Lipase: 29 U/L (ref 11–51)

## 2017-11-08 LAB — TROPONIN I: Troponin I: <0.03 ng/mL (ref <0.03)

## 2017-11-08 MED ORDER — HYDROCODONE-ACETAMINOPHEN 5-325 MG PO TABS
1.0000 | ORAL_TABLET | Freq: Once | ORAL | Status: AC
  Administered 2017-11-08: 1 via ORAL
  Filled 2017-11-08: qty 1

## 2017-11-08 MED ORDER — DOCUSATE SODIUM 100 MG PO CAPS
100.0000 mg | ORAL_CAPSULE | Freq: Once | ORAL | Status: AC
  Administered 2017-11-08: 100 mg via ORAL
  Filled 2017-11-08: qty 1

## 2017-11-08 MED ORDER — HYDROCORTISONE ACE-PRAMOXINE 1-1 % RE FOAM
Freq: Once | RECTAL | Status: AC
  Administered 2017-11-08: 1 via RECTAL
  Filled 2017-11-08: qty 10

## 2017-11-08 MED ORDER — HYDROCORTISONE ACETATE 25 MG RE SUPP
25.0000 mg | Freq: Once | RECTAL | Status: AC
  Administered 2017-11-08: 25 mg via RECTAL
  Filled 2017-11-08 (×2): qty 1

## 2017-11-08 MED ORDER — HYDROCODONE-ACETAMINOPHEN 5-325 MG PO TABS: 1.0000 | ORAL_TABLET | ORAL | 0 refills | Status: DC | PRN

## 2017-11-08 MED ORDER — HYDROCORTISONE ACE-PRAMOXINE 2.5-1 % RE CREA: 1.0000 "application " | TOPICAL_CREAM | Freq: Three times a day (TID) | RECTAL | 0 refills | Status: DC

## 2017-11-08 MED ORDER — DOCUSATE SODIUM 100 MG PO CAPS: 100.0000 mg | ORAL_CAPSULE | Freq: Two times a day (BID) | ORAL | 0 refills | Status: AC

## 2017-11-08 NOTE — ED Triage Notes (Signed)
Patient reports abdominal pain, rectal pain and dizziness that started today.

## 2017-11-08 NOTE — ED Provider Notes (Signed)
[facility] Emergency Department Provider Note  Time seen: 9:12 PM  I have reviewed the triage vital signs and the nursing notes.   HISTORY  Chief Complaint Abdominal Pain and Dizziness    HPI Susan [name] is a 22 y.o. female with a past medical history of ADHD, depression, preeclampsia, 5 days status post NSVD presents to the emergency department for rectal pain and dizziness.  According to the patient since going home several days ago she has remained somewhat dizzy, she states her main complaint however is rectal pain due to hemorrhoids which she developed after the delivery.  States she called her OB/GYN at [facility] for recommendations on hemorrhoid treatment who recommended that she come to the emergency department for evaluation.  I suspect this is more due to the dizziness and the hemorrhoids.  Patient states is very uncomfortable to try to sit on her buttocks due to the rectal pain.  Patient states she had one small bowel movement after her delivery.  Is not currently taking any stool softeners.   Past Medical History:  Diagnosis Date  . ADHD (attention deficit hyperactivity disorder)   . Depression   . Preeclampsia, severe, third trimester    HELLP Syndrome    Patient Active Problem List   Diagnosis Date Noted  . Postpartum care following vaginal delivery 11/06/2017  . Labor and delivery, indication for care 11/03/2017  . Elevated glucose tolerance test 08/24/2017  . Tobacco smoking affecting pregnancy in third trimester 08/21/2017  . Family history of SIDS (sudden infant death syndrome) 2017/05/13  . Supervision of high risk pregnancy, antepartum 04/06/2017  . History of pre-eclampsia 04/06/2017  . Tobacco abuse 12/07/2016  . Marijuana abuse 12/07/2016  . Abnormal liver function tests 09/18/2016  . Chronic headaches 09/18/2016    Past Surgical History:  Procedure Laterality Date  . NO PAST SURGERIES      Prior to Admission medications    Medication Sig Start Date End Date Taking? Authorizing Provider  labetalol (NORMODYNE) 200 MG tablet Take 1 tablet (200 mg total) by mouth 3 (three) times daily. 11/06/17   Tresea Mall, CNM  Prenat-FeFum-FePo-FA-Omega 3 (CONCEPT DHA) 53.5-38-1 MG CAPS TK 1 C PO D. 10/13/17   [provider]    No Known Allergies  Family History  Problem Relation Age of Onset  . SIDS Son     Social History Social History   Tobacco Use  . Smoking status: Current Every Day Smoker    Types: Cigarettes  . Smokeless tobacco: Never Used  . Tobacco comment: 2-3 cigarettes per day/trying to quit  Substance Use Topics  . Alcohol use: Not Currently  . Drug use: Yes    Types: Marijuana    Review of Systems Constitutional: Negative for fever.  Intermittent dizziness. Eyes: Negative for visual complaints ENT: Negative for recent illness/congestion Cardiovascular: Negative for chest pain. Respiratory: Negative for shortness of breath. Gastrointestinal: Negative for abdominal pain, vomiting and diarrhea.  Positive for rectal pain. Genitourinary: Negative for urinary compaints.  Mild vaginal bleeding improved since delivery. Musculoskeletal: Negative for musculoskeletal complaints Skin: Negative for skin complaints  Neurological: Negative for headache All other ROS negative  ____________________________________________   PHYSICAL EXAM:  VITAL SIGNS: ED Triage Vitals  Enc Vitals Group     BP 11/08/17 2017 133/87     Pulse Rate 11/08/17 2017 (!) 129     Resp 11/08/17 2017 20     Temp 11/08/17 2017 98.6 F (37 C)     Temp  Source 11/08/17 2017 Oral     SpO2 11/08/17 2017 98 %     Weight --      Height --      Head Circumference --      Peak Flow --      Pain Score 11/08/17 2018 10     Pain Loc --      Pain Edu? --      Excl. in GC? --     Constitutional: Alert and oriented. Well appearing and in no distress. Eyes: Normal exam ENT   Head: Normocephalic and atraumatic.    Mouth/Throat: Mucous membranes are moist. Cardiovascular: Normal rate, regular rhythm. No murmur Respiratory: Normal respiratory effort without tachypnea nor retractions. Breath sounds are clear Gastrointestinal: Soft and nontender. No distention.  States she feels pressure in her rectum when pushing on the abdomen. Musculoskeletal: Nontender with normal range of motion in all extremities. Neurologic:  Normal speech and language. No gross focal neurologic deficits  Skin:  Skin is warm, dry and intact.  Psychiatric: Mood and affect are normal  ____________________________________________    EKG  EKG reviewed and interpreted by myself shows sinus tachycardia 122 bpm with a narrow QRS, normal axis, normal intervals, nonspecific ST changes.  ____________________________________________   INITIAL IMPRESSION / ASSESSMENT AND PLAN / ED COURSE  Pertinent labs & imaging results that were available during my care of the patient were reviewed by me and considered in my medical decision making (see chart for details).  Patient presents to the emergency department for dizziness and rectal pain.  Patient states the pain is worsened since her delivery 5 days ago.  States intermittent dizziness since the delivery.  Differential would include anemia, electro letter metabolic abnormality, hemorrhoids, thrombosed hemorrhoid.  We will perform a rectal examination once a female nurse becomes available.  Patient's labs show a slight leukocytosis, H&H largely unchanged from 4 days ago, chemistry is normal.  Patient is mildly tachycardic around 120 bpm although the patient does state significant rectal pain at this time as well.  On rectal examination patient has one moderate sized hemorrhoid, does not appear to be thrombosed, is compressible but very tender to the patient.  We will prescribe Analpram, dose a tablet of Norco in the emergency department as well as Colace.  We will likely prescribe the same going  home but would like to watch the patient to ensure that she receives adequate pain relief.  Patient states she is feeling much better.  Heart rate currently 105 bpm.  The patient's labs otherwise largely at baseline I believe the patient is safe for discharge home will prescribe similar medications going home for the patient and have her follow-up with general surgery if symptoms do not improve.  ____________________________________________   FINAL CLINICAL IMPRESSION(S) / ED DIAGNOSES  Rectal pain Hemorrhoids Dizziness   Minna AntisPaduchowski, Seanpaul Preece, MD 11/08/17 2249

## 2017-11-08 NOTE — ED Notes (Signed)
Pt had vaginal delivery Tuesday; here tonight with c/o abd pain and hemorrhoids

## 2017-11-09 ENCOUNTER — Ambulatory Visit (INDEPENDENT_AMBULATORY_CARE_PROVIDER_SITE_OTHER): Payer: Medicaid Other | Admitting: Obstetrics and Gynecology

## 2017-11-09 ENCOUNTER — Encounter: Payer: Self-pay | Admitting: Obstetrics and Gynecology

## 2017-11-09 ENCOUNTER — Ambulatory Visit: Payer: Medicaid Other | Admitting: Obstetrics and Gynecology

## 2017-11-09 VITALS — BP 110/78 | HR 118 | Ht 65.0 in | Wt 192.5 lb

## 2017-11-09 DIAGNOSIS — O1495 Unspecified pre-eclampsia, complicating the puerperium: Secondary | ICD-10-CM

## 2017-11-09 NOTE — Progress Notes (Signed)
Obstetrics & Gynecology Office Visit   Chief Complaint  Patient presents with  . Blood Pressure Check    History of Present Illness: 22 y.o. [id number] female who is 6 days postpartum from an SVD complicated by postpartum preeclampsia requiring magnesium therapy.  She also was tachycardic to the 140s and had a cardiology consultation. She had an EKG concerning for an old posterior infarct. She had an ECHO that was essentially normal for cardiac function.  She was at the ER last night for rectal pain and dizzyness.  She was given some Norco that did help with the pain. A prescription for Analpram was given. She states that she has been taking her BP medication as normal. Denies HA, visual changes, and RUQ pain.  She has not been taking her BP at home, though she does have access to a cuff. Her labs were normal last night at the hospital.    Past Medical History:  Diagnosis Date  . ADHD (attention deficit hyperactivity disorder)   . Depression   . Preeclampsia, severe, third trimester    HELLP Syndrome    Past Surgical History:  Procedure Laterality Date  . NO PAST SURGERIES      Gynecologic History: No LMP recorded.  Obstetric History: [id number]  Family History  Problem Relation Age of Onset  . SIDS Son     Social History   Socioeconomic History  . Marital status: Single    Spouse name: Not on file  . Number of children: 0  . Years of education: Not on file  . Highest education level: Not on file  Occupational History  . Not on file  Social Needs  . Financial resource strain: Not on file  . Food insecurity:    Worry: Not on file    Inability: Not on file  . Transportation needs:    Medical: Not on file    Non-medical: Not on file  Tobacco Use  . Smoking status: Current Every Day Smoker    Types: Cigarettes  . Smokeless tobacco: Never Used  . Tobacco comment: 2-3 cigarettes per day/trying to quit  Substance and Sexual Activity  . Alcohol use: Not Currently  . Drug  use: Yes    Types: Marijuana  . Sexual activity: Not on file  Lifestyle  . Physical activity:    Days per week: Not on file    Minutes per session: Not on file  . Stress: Not on file  Relationships  . Social connections:    Talks on phone: Not on file    Gets together: Not on file    Attends religious service: Not on file    Active member of club or organization: Not on file    Attends meetings of clubs or organizations: Not on file    Relationship status: Not on file  . Intimate partner violence:    Fear of current or ex partner: Not on file    Emotionally abused: Not on file    Physically abused: Not on file    Forced sexual activity: Not on file  Other Topics Concern  . Not on file  Social History Narrative  . Not on file    No Known Allergies  Prior to Admission medications   Medication Sig Start Date End Date Taking? Authorizing Provider  docusate sodium (COLACE) 100 MG capsule Take 1 capsule (100 mg total) by mouth 2 (two) times daily. 11/08/17 12/08/17 Yes Minna AntisPaduchowski, Kevin, MD  hydrocortisone-pramoxine [facility](ANALPRAM HC) 2.5-1 % rectal cream Place  1 application rectally 3 (three) times daily. 11/08/17  Yes Minna Antis, MD  labetalol (NORMODYNE) 200 MG tablet Take 1 tablet (200 mg total) by mouth 3 (three) times daily. 11/06/17  Yes Tresea Mall, CNM  Prenat-FeFum-FePo-FA-Omega 3 (CONCEPT DHA) 53.5-38-1 MG CAPS TK 1 C PO D. 10/13/17  Yes [provider]  HYDROcodone-acetaminophen (NORCO/VICODIN) 5-325 MG tablet Take 1 tablet by mouth every 4 (four) hours as needed. Patient not taking: Reported on 11/09/2017 11/08/17   Minna Antis, MD    Review of Systems  Constitutional: Negative.   HENT: Negative.   Eyes: Negative.  Negative for blurred vision and double vision.  Respiratory: Negative.   Cardiovascular: Negative.  Negative for chest pain.  Gastrointestinal: Negative.   Genitourinary: Negative.   Musculoskeletal: Negative.   Skin: Negative.     Neurological: Positive for dizziness. Negative for tingling, tremors, sensory change, speech change, focal weakness, seizures, loss of consciousness, weakness and headaches.  Psychiatric/Behavioral: Negative.      Physical Exam BP 110/78 (BP Location: Left Arm, Patient Position: Sitting, Cuff Size: Normal)   Pulse (!) 118   Ht 5\' 5"  (1.651 m)   Wt 192 lb 8 oz (87.3 kg)   SpO2 99%   BMI 32.03 kg/m  No LMP recorded. Physical Exam  Constitutional: She is oriented to person, place, and time. She appears well-developed and well-nourished. No distress.  HENT:  Head: Normocephalic and atraumatic.  Eyes: Conjunctivae are normal. No scleral icterus.  Neck: Normal range of motion.  Cardiovascular: Regular rhythm.  tachycardic  Pulmonary/Chest: Effort normal and breath sounds normal. No respiratory distress.  Musculoskeletal: Normal range of motion. She exhibits no edema.  Neurological: She is alert and oriented to person, place, and time. No cranial nerve deficit.  Psychiatric: She has a normal mood and affect. Her behavior is normal. Judgment normal.    Assessment: 22 y.o. [id number] female here for  1. Preeclampsia in postpartum period   2. Postpartum care following vaginal delivery      Plan: Problem List Items Addressed This Visit      Cardiovascular and Mediastinum   Preeclampsia in postpartum period - Primary     Other   Postpartum care following vaginal delivery     The patient has been stable in the postpartum period since discharge.  She denies chest pain and feeling tachycardic (palpitations).  She may have low blood pressures on her medication. I gave her instructions on how to lower her blood pressure medication at home.  Precautions reviewed for returning to clinic or ER.    Thomasene Mohair, MD 11/09/2017 5:03 PM

## 2017-11-17 ENCOUNTER — Ambulatory Visit: Payer: Medicaid Other | Admitting: Obstetrics and Gynecology

## 2018-04-04 ENCOUNTER — Emergency Department: Payer: Medicaid Other

## 2018-04-04 ENCOUNTER — Emergency Department
Admission: EM | Admit: 2018-04-04 | Discharge: 2018-04-04 | Disposition: A | Discharge status: Home / Self Care | Payer: Medicaid Other | Attending: Emergency Medicine | Admitting: Emergency Medicine

## 2018-04-04 ENCOUNTER — Encounter: Payer: Self-pay | Admitting: Emergency Medicine

## 2018-04-04 DIAGNOSIS — Z79899 Other long term (current) drug therapy: Secondary | ICD-10-CM | POA: Diagnosis not present

## 2018-04-04 DIAGNOSIS — K2900 Acute gastritis without bleeding: Secondary | ICD-10-CM | POA: Diagnosis not present

## 2018-04-04 DIAGNOSIS — F909 Attention-deficit hyperactivity disorder, unspecified type: Secondary | ICD-10-CM | POA: Insufficient documentation

## 2018-04-04 DIAGNOSIS — F17210 Nicotine dependence, cigarettes, uncomplicated: Secondary | ICD-10-CM | POA: Diagnosis not present

## 2018-04-04 DIAGNOSIS — R1013 Epigastric pain: Secondary | ICD-10-CM

## 2018-04-04 LAB — CBC WITH DIFFERENTIAL/PLATELET
ABS IMMATURE GRANULOCYTES: 0.03 10*3/uL (ref 0.00–0.07)
BASOS PCT: 0 %
Basophils Absolute: 0 10*3/uL (ref 0.0–0.1)
Eosinophils Absolute: 0.1 10*3/uL (ref 0.0–0.5)
Eosinophils Relative: 1 %
HCT: 41.7 % (ref 36.0–46.0)
HEMOGLOBIN: 13.4 g/dL (ref 12.0–15.0)
IMMATURE GRANULOCYTES: 0 %
LYMPHS ABS: 2.7 10*3/uL (ref 0.7–4.0)
LYMPHS PCT: 23 %
MCH: 30 pg (ref 26.0–34.0)
MCHC: 32.1 g/dL (ref 30.0–36.0)
MCV: 93.3 fL (ref 80.0–100.0)
MONO ABS: 0.7 10*3/uL (ref 0.1–1.0)
MONOS PCT: 6 %
NEUTROS ABS: 8.2 10*3/uL — AB (ref 1.7–7.7)
NEUTROS PCT: 70 %
PLATELETS: 289 10*3/uL (ref 150–400)
RBC: 4.47 MIL/uL (ref 3.87–5.11)
RDW: 13.1 % (ref 11.5–15.5)
WBC: 11.8 10*3/uL — ABNORMAL HIGH (ref 4.0–10.5)
nRBC: 0 % (ref 0.0–0.2)

## 2018-04-04 LAB — COMPREHENSIVE METABOLIC PANEL
ALBUMIN: 4.1 g/dL (ref 3.5–5.0)
ALT: 57 U/L — AB (ref 0–44)
AST: 23 U/L (ref 15–41)
Alkaline Phosphatase: 100 U/L (ref 38–126)
Anion gap: 11 (ref 5–15)
BUN: 8 mg/dL (ref 6–20)
CHLORIDE: 107 mmol/L (ref 98–111)
CO2: 21 mmol/L — AB (ref 22–32)
CREATININE: 0.65 mg/dL (ref 0.44–1.00)
Calcium: 9.2 mg/dL (ref 8.9–10.3)
GFR calc Af Amer: >60 mL/min (ref >60)
GLUCOSE: 114 mg/dL — AB (ref 70–99)
POTASSIUM: 3.7 mmol/L (ref 3.5–5.1)
Sodium: 139 mmol/L (ref 135–145)
Total Bilirubin: 0.8 mg/dL (ref 0.3–1.2)
Total Protein: 7.4 g/dL (ref 6.5–8.1)

## 2018-04-04 LAB — URINALYSIS, COMPLETE (UACMP) WITH MICROSCOPIC
BILIRUBIN URINE: NEGATIVE
Bacteria, UA: NONE SEEN
GLUCOSE, UA: NEGATIVE mg/dL
KETONES UR: NEGATIVE mg/dL
Nitrite: NEGATIVE
PH: 6 (ref 5.0–8.0)
PROTEIN: NEGATIVE mg/dL
Specific Gravity, Urine: 1.027 (ref 1.005–1.030)

## 2018-04-04 LAB — LIPASE, BLOOD: Lipase: 28 U/L (ref 11–51)

## 2018-04-04 LAB — WET PREP, GENITAL
CLUE CELLS WET PREP: NONE SEEN
SPERM: NONE SEEN
TRICH WET PREP: NONE SEEN
Yeast Wet Prep HPF POC: NONE SEEN

## 2018-04-04 LAB — CHLAMYDIA/NGC RT PCR (ARMC ONLY)
Chlamydia Tr: NOT DETECTED
N gonorrhoeae: NOT DETECTED

## 2018-04-04 LAB — POCT PREGNANCY, URINE: Preg Test, Ur: NEGATIVE

## 2018-04-04 MED ORDER — ALUM & MAG HYDROXIDE-SIMETH 200-200-20 MG/5ML PO SUSP
30.0000 mL | Freq: Once | ORAL | Status: AC
Start: 2018-04-04 — End: 2018-04-04
  Administered 2018-04-04: 30 mL via ORAL
  Filled 2018-04-04: qty 30

## 2018-04-04 MED ORDER — MORPHINE SULFATE (PF) 4 MG/ML IV SOLN
4.0000 mg | Freq: Once | INTRAVENOUS | Status: AC
  Administered 2018-04-04: 4 mg via INTRAVENOUS
  Filled 2018-04-04: qty 1

## 2018-04-04 MED ORDER — ONDANSETRON HCL 4 MG/2ML IJ SOLN
4.0000 mg | Freq: Once | INTRAMUSCULAR | Status: AC
  Administered 2018-04-04: 4 mg via INTRAVENOUS
  Filled 2018-04-04: qty 2

## 2018-04-04 MED ORDER — ACETAMINOPHEN 500 MG PO TABS
1000.0000 mg | ORAL_TABLET | Freq: Once | ORAL | Status: AC
  Administered 2018-04-04: 1000 mg via ORAL
  Filled 2018-04-04: qty 2

## 2018-04-04 MED ORDER — SODIUM CHLORIDE 0.9 % IV BOLUS
1000.0000 mL | Freq: Once | INTRAVENOUS | Status: AC
  Administered 2018-04-04: 1000 mL via INTRAVENOUS

## 2018-04-04 MED ORDER — IOPAMIDOL (ISOVUE-300) INJECTION 61%
100.0000 mL | Freq: Once | INTRAVENOUS | Status: AC | PRN
  Administered 2018-04-04: 100 mL via INTRAVENOUS

## 2018-04-04 MED ORDER — IOPAMIDOL (ISOVUE-300) INJECTION 61%
30.0000 mL | Freq: Once | INTRAVENOUS | Status: AC | PRN
  Administered 2018-04-04: 30 mL via ORAL

## 2018-04-04 NOTE — Discharge Instructions (Addendum)
You are evaluated for abdominal pain, and although no certain cause was found, your exam and evaluation are overall reassuring in the emergency department today.  Return to the emergency department immediately for any worsening condition including new or worsening or uncontrolled abdominal pain, fever, black or bloody stools, vomiting blood, trouble breathing, chest pain, or any other symptoms concerning to you.  As we discussed, I suspect acid reflux also known as GERD or gastritis as the likely source of your upper abdominal pain.  Avoid fatty and spicy foods including caffeine, carbonated beverages, and acidic foods like citrus fruits or juices for 1 to 2 weeks.  You may try over-the-counter Maalox, use as directed on labeling to help with acid.  You may try over-the-counter Prilosec 40 mg once daily for 1 to 2 weeks to reduce acid while stomach heals.

## 2018-04-04 NOTE — ED Notes (Signed)
Patient transported to [facility] 

## 2018-04-04 NOTE — ED Provider Notes (Addendum)
[facility] Emergency Department Provider Note ____________________________________________   I have reviewed the triage vital signs and the triage nursing note.  HISTORY  Chief Complaint Abdominal Pain   Historian Patient  HPI Susan [name] is a 22 y.o. female presenting for epigastric pain that started about 3 days ago and has been mostly constant although somewhat waxing and waning.  It seems like it somewhat worse when she eats something cold.  Family history positive for gallstones, personal history of such.  No hematuria.  States that the pain now seems like it is radiating down into the lower abdomen as well.  No urinary symptoms, other than dark urine.  States that she drinks a lot of Dr. Reino Kent and that all she drinks.  No nausea or vomiting.  States that she has had some constipation and think she might be developing a hemorrhoid because of pain at the rectum.  No black or bloody stools.  Pain is moderate in intensity.     Past Medical History:  Diagnosis Date  . ADHD (attention deficit hyperactivity disorder)   . Depression   . Preeclampsia, severe, third trimester    HELLP Syndrome    Patient Active Problem List   Diagnosis Date Noted  . Preeclampsia in postpartum period 11/09/2017  . Postpartum care following vaginal delivery 11/06/2017  . Labor and delivery, indication for care 11/03/2017  . Elevated glucose tolerance test 08/24/2017  . Tobacco smoking affecting pregnancy in third trimester 08/21/2017  . Family history of SIDS (sudden infant death syndrome) 2017/05/13  . Supervision of high risk pregnancy, antepartum 04/06/2017  . History of pre-eclampsia 04/06/2017  . Tobacco abuse 12/07/2016  . Marijuana abuse 12/07/2016  . Abnormal liver function tests 09/18/2016  . Chronic headaches 09/18/2016    Past Surgical History:  Procedure Laterality Date  . NO PAST SURGERIES      Prior to Admission medications   Medication Sig  Start Date End Date Taking? Authorizing Provider  HYDROcodone-acetaminophen (NORCO/VICODIN) 5-325 MG tablet Take 1 tablet by mouth every 4 (four) hours as needed. Patient not taking: Reported on 11/09/2017 11/08/17   Minna Antis, MD  hydrocortisone-pramoxine [facility]) 2.5-1 % rectal cream Place 1 application rectally 3 (three) times daily. 11/08/17   Minna Antis, MD  labetalol (NORMODYNE) 200 MG tablet Take 1 tablet (200 mg total) by mouth 3 (three) times daily. 11/06/17   Tresea Mall, CNM  Prenat-FeFum-FePo-FA-Omega 3 (CONCEPT DHA) 53.5-38-1 MG CAPS TK 1 C PO D. 10/13/17   [provider]    No Known Allergies  Family History  Problem Relation Age of Onset  . SIDS Son     Social History Social History   Tobacco Use  . Smoking status: Current Every Day Smoker    Types: Cigarettes  . Smokeless tobacco: Never Used  . Tobacco comment: 2-3 cigarettes per day/trying to quit  Substance Use Topics  . Alcohol use: Not Currently  . Drug use: Yes    Types: Marijuana    Review of Systems  Constitutional: Negative for fever. Eyes: Negative for visual changes. ENT: Negative for sore throat. Cardiovascular: Negative for chest pain. Respiratory: Negative for shortness of breath. Gastrointestinal: Positive for epigastric pain as per HPI. Genitourinary: Negative for dysuria. Musculoskeletal: Negative for back pain. Skin: Negative for rash. Neurological: Negative for headache.  ____________________________________________   PHYSICAL EXAM:  VITAL SIGNS: ED Triage Vitals  Enc Vitals Group     BP 04/04/18 0820 (!) 152/92     Pulse  Rate 04/04/18 0820 (!) 103     Resp 04/04/18 0820 20     Temp 04/04/18 0820 98 F (36.7 C)     Temp Source 04/04/18 0820 Oral     SpO2 04/04/18 0820 100 %     Weight 04/04/18 0815 180 lb (81.6 kg)     Height 04/04/18 0815 5\' 5"  (1.651 m)     Head Circumference --      Peak Flow --      Pain Score 04/04/18 0815 8     Pain Loc  --      Pain Edu? --      Excl. in GC? --      Constitutional: Alert and oriented.  Smells heavily of cigarette smoke. HEENT      Head: Normocephalic and atraumatic.      Eyes: Conjunctivae are normal. Pupils equal and round.       Ears:         Nose: No congestion/rhinnorhea.      Mouth/Throat: Mucous membranes are mildly dry.      Neck: No stridor. Cardiovascular/Chest: Normal rate, regular rhythm.  No murmurs, rubs, or gallops. Respiratory: Normal respiratory effort without tachypnea nor retractions. Breath sounds are clear and equal bilaterally. No wheezes/rales/rhonchi. Gastrointestinal: Soft. No distention, no guarding, no rebound.  Moderate tenderness in the epigastrium and mid abdomen, mild tenderness down into the lower abdomen without any focal McBurney's point tenderness.  No focal right upper quadrant tenderness but pain in epigastrium and upper abdomen diffusely. Genitourinary/rectal: Mild amount of vaginal discharge, no bleeding, no cervicitis or adnexal tenderness or mass. Musculoskeletal: Nontender with normal range of motion in all extremities. No joint effusions.  No lower extremity tenderness.  No edema. Neurologic:  Normal speech and language. No gross or focal neurologic deficits are appreciated. Skin:  Skin is warm, dry and intact. No rash noted. Psychiatric: Mood and affect are normal. Speech and behavior are normal. Patient exhibits appropriate insight and judgment.   ____________________________________________  LABS (pertinent positives/negatives) I, Governor Rooksebecca Damontre Millea, MD the attending physician have reviewed the labs noted below.  Labs Reviewed  WET PREP, GENITAL - Abnormal; Notable for the following components:      Result Value   WBC, Wet Prep HPF POC MODERATE (*)    All other components within normal limits  COMPREHENSIVE METABOLIC PANEL - Abnormal; Notable for the following components:   CO2 21 (*)    Glucose, Bld 114 (*)    ALT 57 (*)    All other  components within normal limits  CBC WITH DIFFERENTIAL/PLATELET - Abnormal; Notable for the following components:   WBC 11.8 (*)    Neutro Abs 8.2 (*)    All other components within normal limits  URINALYSIS, COMPLETE (UACMP) WITH MICROSCOPIC - Abnormal; Notable for the following components:   Color, Urine YELLOW (*)    APPearance HAZY (*)    Hgb urine dipstick SMALL (*)    Leukocytes, UA SMALL (*)    All other components within normal limits  CHLAMYDIA/NGC RT PCR ([facility] ONLY)  LIPASE, BLOOD  POC URINE PREG, ED  POCT PREGNANCY, URINE    ____________________________________________    EKG I, Governor Rooksebecca Valory Wetherby, MD, the attending physician have personally viewed and interpreted all ECGs.  97 bpm.  Normal sinus rhythm.  Narrow QS.  Normal axis.  Normal ST and T wave ____________________________________________  RADIOLOGY   Ultrasound limited right upper quadrant:  IMPRESSION: Normal RIGHT UPPER quadrant abdominal ultrasound.  Ct abd and  pel with contrast:  IMPRESSION: No acute intra-abdominal or intrapelvic abnormalities.  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 __________________________________________  PROCEDURES  Procedure(s) performed: None  Procedures  Critical Care performed: None   ____________________________________________  ED COURSE / ASSESSMENT AND PLAN  Pertinent labs & imaging results that were available during my care of the patient were reviewed by me and considered in my medical decision making (see chart for details).    Epigastric pain seems most likely related to GERD versus biliary colic, however will initiate broad evaluation for emergency medical conditions.    Normal lfts and lipase.  Some more right sided abd pain than left.  [place] abd without abnormality.  Discussed risk and benefit of CT imaging of the abdomen and chose to proceed.  CT is reassuring without any cause for abdominal pain.  Her pain is more so upper  abdomen, and I think clinically is probably most consistent with GERD at this point.     CONSULTATIONS:   None  Patient / Family / Caregiver informed of clinical course, medical decision-making process, and agree with plan.   I discussed return precautions, follow-up instructions, and discharge instructions with patient and/or family.  Discharge Instructions : You are evaluated for abdominal pain, and although no certain cause was found, your exam and evaluation are overall reassuring in the emergency department today.  Return to the emergency department immediately for any worsening condition including new or worsening or uncontrolled abdominal pain, fever, black or bloody stools, vomiting blood, trouble breathing, chest pain, or any other symptoms concerning to you.  As we discussed, I suspect acid reflux also known as GERD or gastritis as the likely source of your upper abdominal pain.  Avoid fatty and spicy foods including caffeine, carbonated beverages, and acidic foods like citrus fruits or juices for 1 to 2 weeks.  You may try over-the-counter Maalox, use as directed on labeling to help with acid.  You may try over-the-counter Prilosec 40 mg once daily for 1 to 2 weeks to reduce acid while stomach heals.      ___________________________________________   FINAL CLINICAL IMPRESSION(S) / ED DIAGNOSES   Final diagnoses:  Acute epigastric pain  Acute gastritis without hemorrhage, unspecified gastritis type      ___________________________________________         Note: This dictation was prepared with Dragon dictation. Any transcriptional errors that result from this process are unintentional    Governor Rooks, MD 04/04/18 1155    Governor Rooks, MD 04/04/18 1155

## 2018-04-04 NOTE — ED Triage Notes (Signed)
Pt reports pain in her abd that wraps around to her back for the past 3 days. Denies NVD. Pt reports last BM was yesterday and it was a struggle. Pt reports she feels like her abd is swollen.

## 2018-06-17 ENCOUNTER — Ambulatory Visit: Payer: Medicaid Other | Admitting: Obstetrics & Gynecology

## 2018-06-30 IMAGING — CT CT ANGIO CHEST
2 of 7 series · 19 of 46 positions shown · IV contrast (iopamidol)
Comparison: None.

CLINICAL DATA: Ventricular arrhythmia.

EXAM:
CT ANGIOGRAPHY CHEST WITH CONTRAST
TECHNIQUE: Multidetector CT imaging of the chest was performed using the
standard protocol during bolus administration of intravenous
contrast. Multiplanar CT image reconstructions and MIPs were
obtained to evaluate the vascular anatomy.
CONTRAST:  100mL CG12SB-IP5 IOPAMIDOL (CG12SB-IP5) INJECTION 76%

[Series 5: thins · axial · 0.61mm/px · z∈[-326,-68]mm · 16 of 290 slices shown]
[im 16/290  lung]
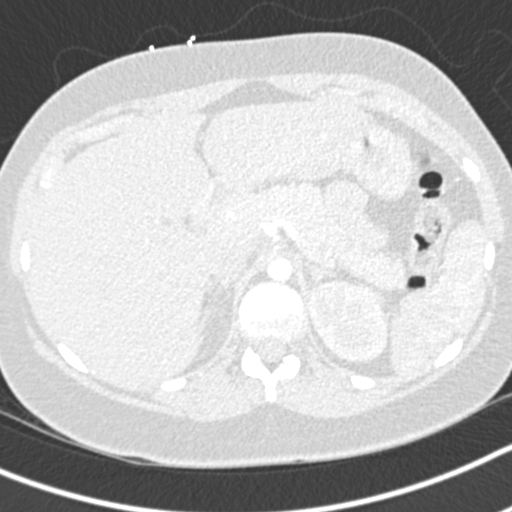
[im 31/290  soft-tissue]
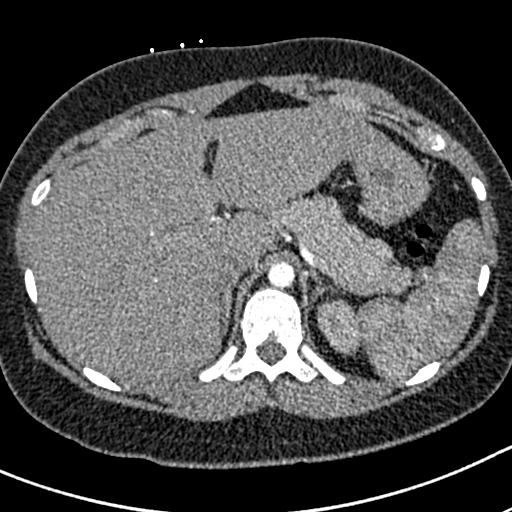
[im 46/290  lung]
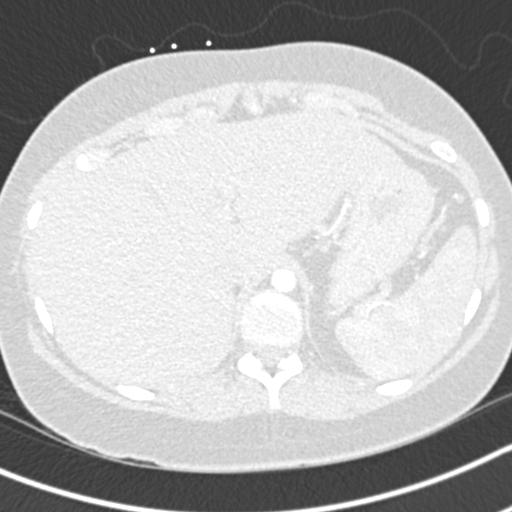
[im 61/290  soft-tissue]
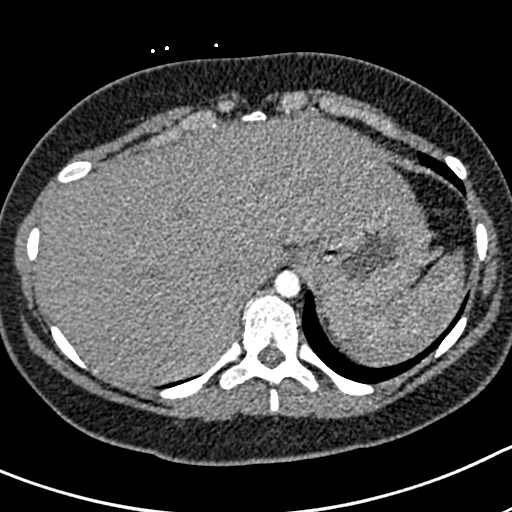
[im 92/290  lung]
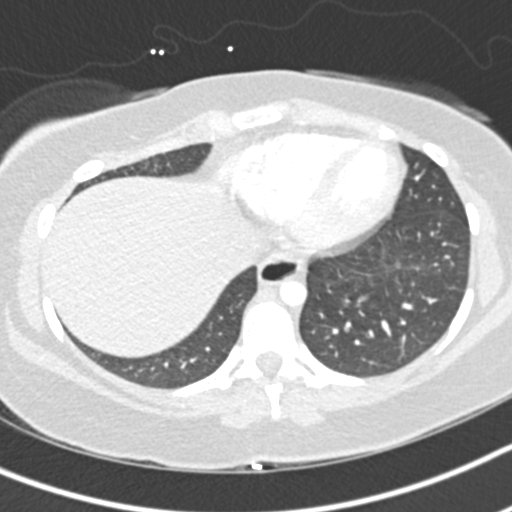
[im 107/290  soft-tissue]
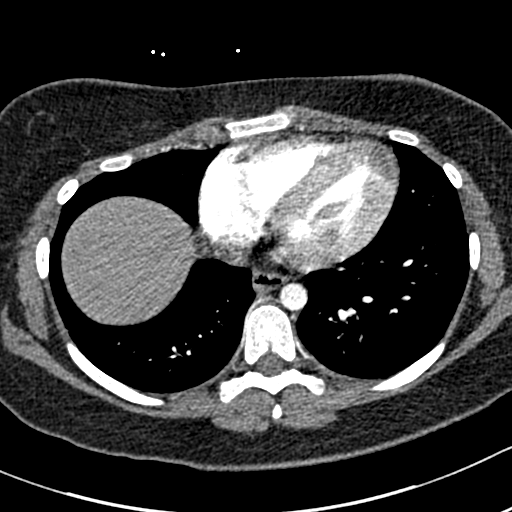
[im 122/290  lung]
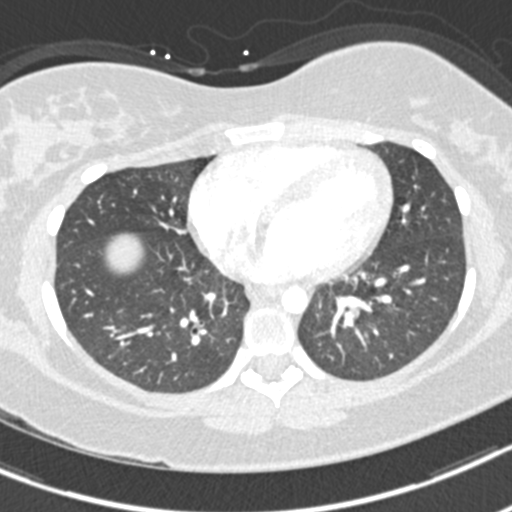
[im 137/290  soft-tissue]
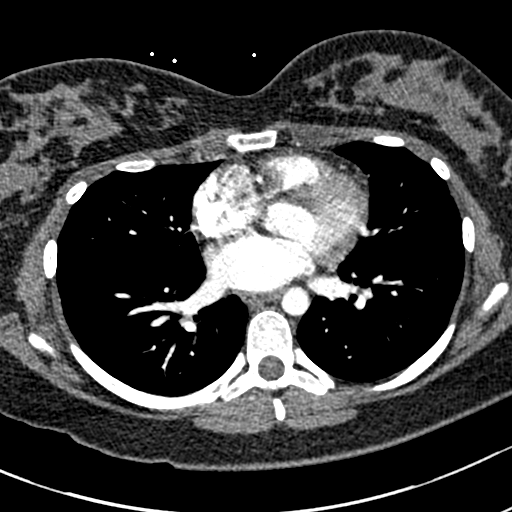
[im 153/290  lung]
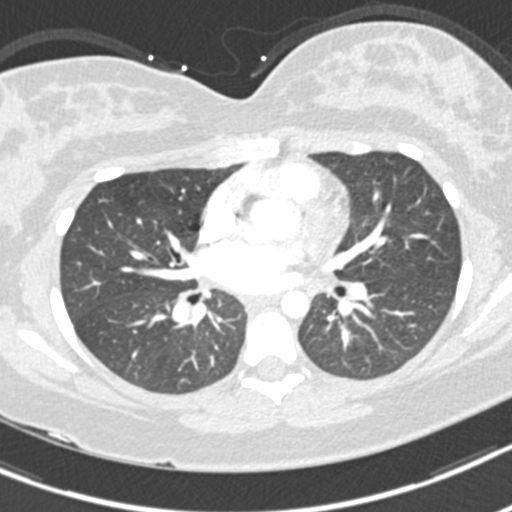
[im 168/290  soft-tissue]
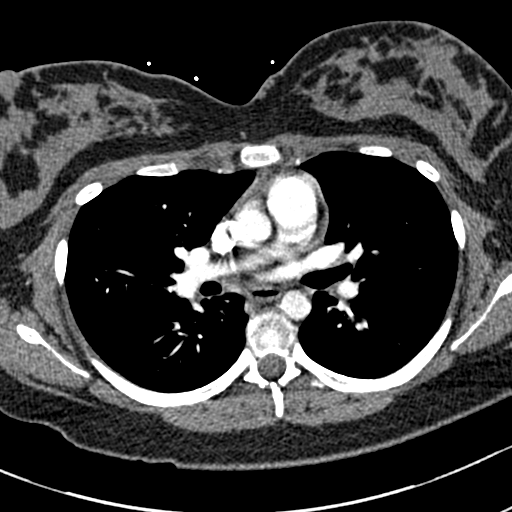
[im 183/290  lung]
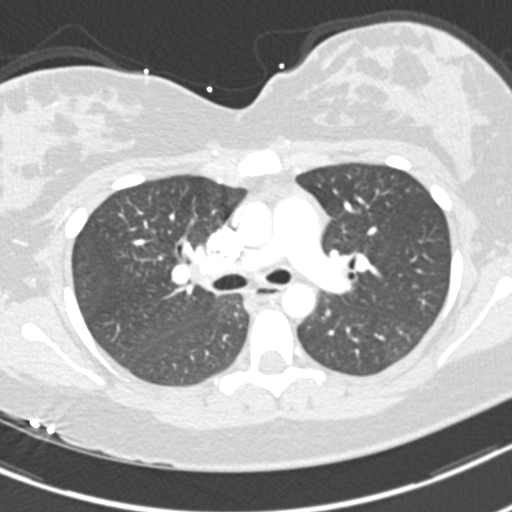
[im 198/290  soft-tissue]
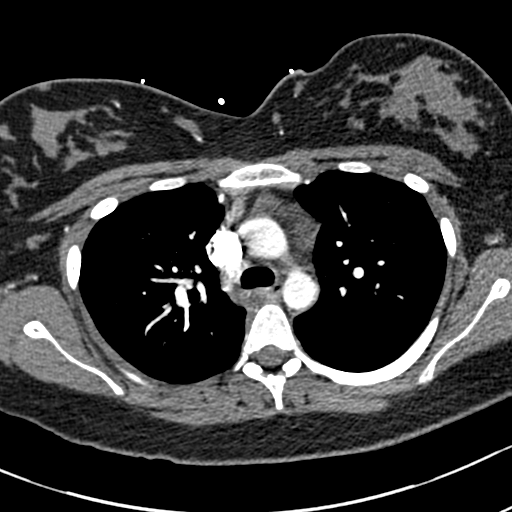
[im 229/290  lung]
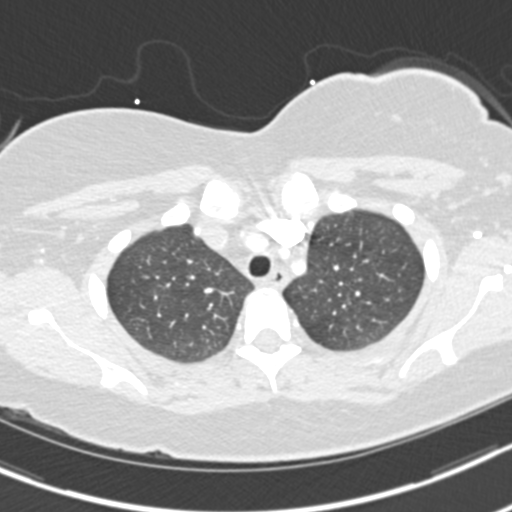
[im 244/290  soft-tissue]
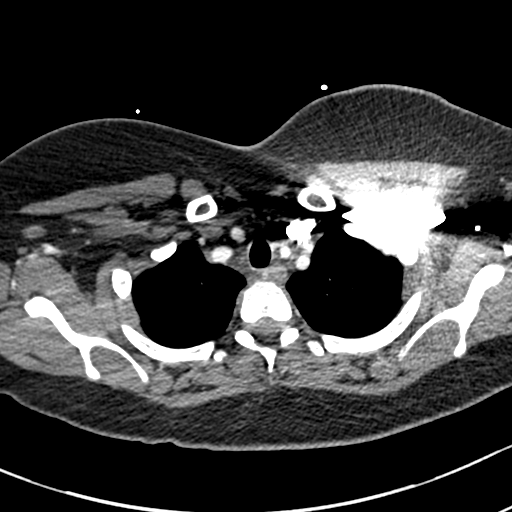
[im 259/290  lung]
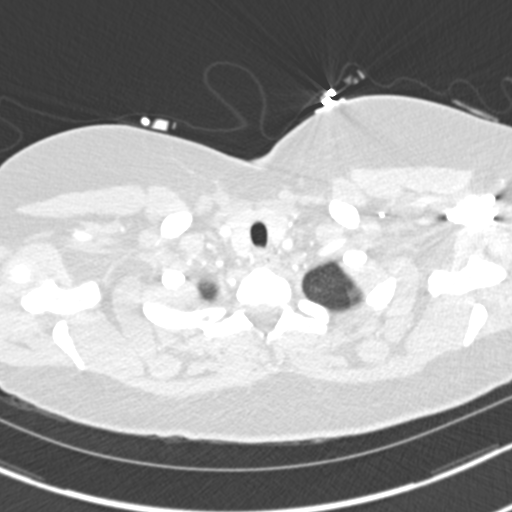
[im 274/290  soft-tissue]
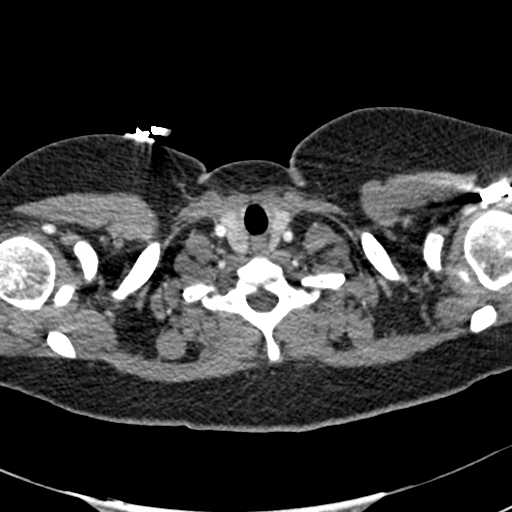

[Series 7: coronal mpr · coronal · 0.57mm/px · 3 of 67 slices shown]
[im 17/67  soft-tissue]
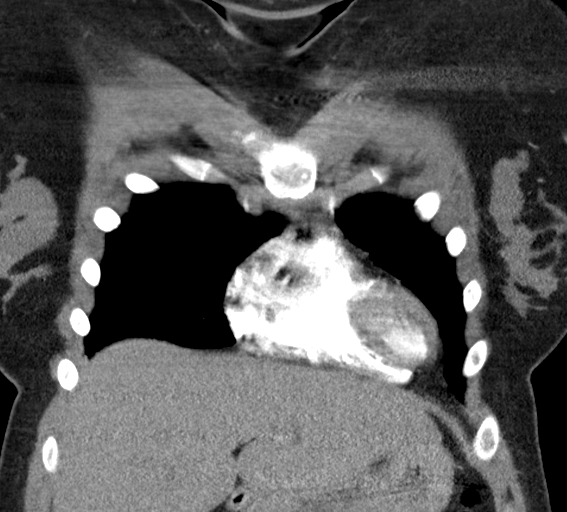
[im 34/67  soft-tissue]
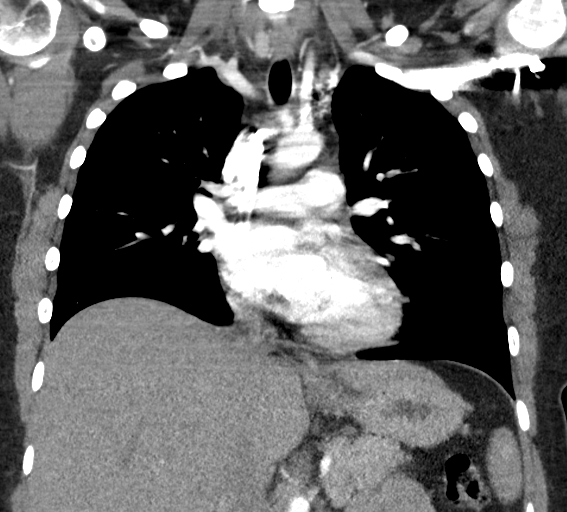
[im 50/67  soft-tissue]
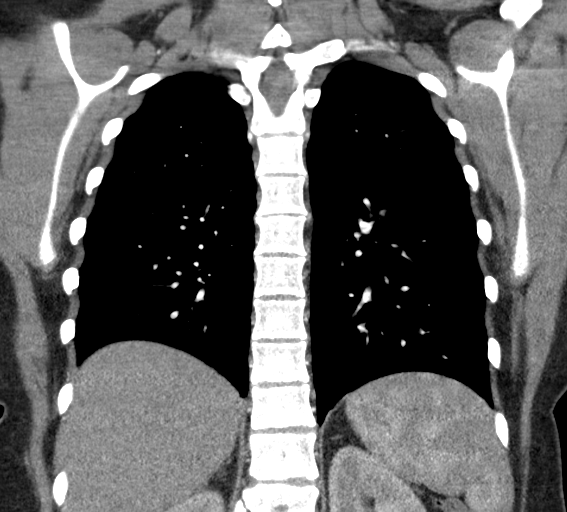

[19 of 46 positions shown; findings below may reference images not displayed]

FINDINGS: Cardiovascular: The study is of quality for the evaluation of
pulmonary embolism. There are no filling defects in the central,
lobar, segmental or subsegmental pulmonary artery branches to
suggest acute pulmonary embolism. Great vessels are normal in course
and caliber. Normal heart size. No significant pericardial
fluid/thickening.

Mediastinum/Nodes: No discrete thyroid nodules. Unremarkable
esophagus. No pathologically enlarged axillary, mediastinal or hilar
lymph nodes.

Lungs/Pleura:  No pneumothorax.  No pleural effusion. Clear lungs.

Upper abdomen: Unremarkable.

Musculoskeletal:  No aggressive appearing focal osseous lesions.

Review of the MIP images confirms the above findings.
IMPRESSION: No acute pulmonary embolus.  No active pulmonary disease.

## 2018-08-03 ENCOUNTER — Emergency Department
Admission: EM | Admit: 2018-08-03 | Discharge: 2018-08-03 | Disposition: A | Discharge status: Home / Self Care | Payer: Medicaid Other | Attending: Emergency Medicine | Admitting: Emergency Medicine

## 2018-08-03 ENCOUNTER — Other Ambulatory Visit: Payer: Self-pay

## 2018-08-03 ENCOUNTER — Encounter: Payer: Self-pay | Admitting: Emergency Medicine

## 2018-08-03 DIAGNOSIS — R1013 Epigastric pain: Secondary | ICD-10-CM | POA: Diagnosis present

## 2018-08-03 DIAGNOSIS — F17210 Nicotine dependence, cigarettes, uncomplicated: Secondary | ICD-10-CM | POA: Insufficient documentation

## 2018-08-03 DIAGNOSIS — K2900 Acute gastritis without bleeding: Secondary | ICD-10-CM | POA: Diagnosis not present

## 2018-08-03 DIAGNOSIS — Z79899 Other long term (current) drug therapy: Secondary | ICD-10-CM | POA: Insufficient documentation

## 2018-08-03 LAB — URINALYSIS, COMPLETE (UACMP) WITH MICROSCOPIC
BACTERIA UA: NONE SEEN
Bilirubin Urine: NEGATIVE
Glucose, UA: NEGATIVE mg/dL
Hgb urine dipstick: NEGATIVE
Ketones, ur: NEGATIVE mg/dL
Nitrite: NEGATIVE
Protein, ur: NEGATIVE mg/dL
SPECIFIC GRAVITY, URINE: 1.034 — AB (ref 1.005–1.030)
pH: 5 (ref 5.0–8.0)

## 2018-08-03 LAB — COMPREHENSIVE METABOLIC PANEL
ALT: 38 U/L (ref 0–44)
AST: 21 U/L (ref 15–41)
Albumin: 4 g/dL (ref 3.5–5.0)
Alkaline Phosphatase: 86 U/L (ref 38–126)
Anion gap: 8 (ref 5–15)
BUN: 12 mg/dL (ref 6–20)
CO2: 22 mmol/L (ref 22–32)
Calcium: 8.9 mg/dL (ref 8.9–10.3)
Chloride: 106 mmol/L (ref 98–111)
Creatinine, Ser: 0.63 mg/dL (ref 0.44–1.00)
GFR calc Af Amer: >60 mL/min (ref >60)
GFR calc non Af Amer: >60 mL/min (ref >60)
Glucose, Bld: 131 mg/dL — ABNORMAL HIGH (ref 70–99)
Potassium: 3.6 mmol/L (ref 3.5–5.1)
Sodium: 136 mmol/L (ref 135–145)
TOTAL PROTEIN: 7.3 g/dL (ref 6.5–8.1)
Total Bilirubin: 0.5 mg/dL (ref 0.3–1.2)

## 2018-08-03 LAB — CBC
HCT: 38 % (ref 36.0–46.0)
Hemoglobin: 12.7 g/dL (ref 12.0–15.0)
MCH: 31.5 pg (ref 26.0–34.0)
MCHC: 33.4 g/dL (ref 30.0–36.0)
MCV: 94.3 fL (ref 80.0–100.0)
Platelets: 222 10*3/uL (ref 150–400)
RBC: 4.03 MIL/uL (ref 3.87–5.11)
RDW: 13.1 % (ref 11.5–15.5)
WBC: 9 10*3/uL (ref 4.0–10.5)
nRBC: 0 % (ref 0.0–0.2)

## 2018-08-03 LAB — POCT PREGNANCY, URINE: PREG TEST UR: NEGATIVE

## 2018-08-03 LAB — LIPASE, BLOOD: Lipase: 24 U/L (ref 11–51)

## 2018-08-03 MED ORDER — ALUM & MAG HYDROXIDE-SIMETH 200-200-20 MG/5ML PO SUSP
30.0000 mL | Freq: Once | ORAL | Status: AC
  Administered 2018-08-03: 30 mL via ORAL
  Filled 2018-08-03: qty 30

## 2018-08-03 MED ORDER — LIDOCAINE VISCOUS HCL 2 % MT SOLN
15.0000 mL | Freq: Once | OROMUCOSAL | Status: AC
  Administered 2018-08-03: 15 mL via ORAL
  Filled 2018-08-03: qty 15

## 2018-08-03 MED ORDER — SUCRALFATE 1 G PO TABS: 1.0000 g | ORAL_TABLET | Freq: Four times a day (QID) | ORAL | 0 refills | Status: DC

## 2018-08-03 MED ORDER — ONDANSETRON 4 MG PO TBDP: 4.0000 mg | ORAL_TABLET | Freq: Three times a day (TID) | ORAL | 0 refills | Status: DC | PRN

## 2018-08-03 NOTE — ED Triage Notes (Signed)
Upper abd pain for 3-4 days.  Went to dr yesterday.  Has been taking anti acid meds and it is getting worse pain.

## 2018-08-03 NOTE — ED Provider Notes (Signed)
[facility] Emergency Department Provider Note   ____________________________________________    I have reviewed the triage vital signs and the nursing notes.   HISTORY  Chief Complaint Abdominal Pain     HPI Susan [name] is a 23 y.o. female who presents with complaints of gastric abdominal pain.  Patient reports symptoms started 3 to 4 days ago, she saw her PCP who started her on "acid medication ".  She denies a history of abdominal surgery.  She states "I do not know if I am pregnant or not ".  No fevers or chills.  Some nausea no vomiting.  No diarrhea.  Currently drinking soda.  Past Medical History:  Diagnosis Date  . ADHD (attention deficit hyperactivity disorder)   . Depression   . Preeclampsia, severe, third trimester    HELLP Syndrome    Patient Active Problem List   Diagnosis Date Noted  . Preeclampsia in postpartum period 11/09/2017  . Postpartum care following vaginal delivery 11/06/2017  . Labor and delivery, indication for care 11/03/2017  . Elevated glucose tolerance test 08/24/2017  . Tobacco smoking affecting pregnancy in third trimester 08/21/2017  . Family history of SIDS (sudden infant death syndrome) May 23, 2017  . Supervision of high risk pregnancy, antepartum 04/06/2017  . History of pre-eclampsia 04/06/2017  . Tobacco abuse 12/07/2016  . Marijuana abuse 12/07/2016  . Abnormal liver function tests 09/18/2016  . Chronic headaches 09/18/2016    Past Surgical History:  Procedure Laterality Date  . NO PAST SURGERIES      Prior to Admission medications   Medication Sig Start Date End Date Taking? Authorizing Provider  HYDROcodone-acetaminophen (NORCO/VICODIN) 5-325 MG tablet Take 1 tablet by mouth every 4 (four) hours as needed. Patient not taking: Reported on 11/09/2017 11/08/17   Minna Antis, MD  hydrocortisone-pramoxine [facility]) 2.5-1 % rectal cream Place 1 application rectally 3 (three) times daily.  11/08/17   Minna Antis, MD  labetalol (NORMODYNE) 200 MG tablet Take 1 tablet (200 mg total) by mouth 3 (three) times daily. 11/06/17   Tresea Mall, CNM  ondansetron (ZOFRAN ODT) 4 MG disintegrating tablet Take 1 tablet (4 mg total) by mouth every 8 (eight) hours as needed. 08/03/18   Jene Every, MD  Prenat-FeFum-FePo-FA-Omega 3 (CONCEPT DHA) 53.5-38-1 MG CAPS TK 1 C PO D. 10/13/17   [provider]  sucralfate (CARAFATE) 1 g tablet Take 1 tablet (1 g total) by mouth 4 (four) times daily. 08/03/18 08/03/19  Jene Every, MD     Allergies Patient has no known allergies.  Family History  Problem Relation Age of Onset  . SIDS Son     Social History Social History   Tobacco Use  . Smoking status: Current Every Day Smoker    Packs/day: 0.50    Types: Cigarettes  . Smokeless tobacco: Never Used  Substance Use Topics  . Alcohol use: Not Currently  . Drug use: Yes    Frequency: 1.0 times per week    Types: Marijuana    Review of Systems  Constitutional: No fever/chills Eyes: No visual changes.  ENT: No sore throat. Cardiovascular: Denies chest pain. Respiratory: Denies shortness of breath. Gastrointestinal: As above Genitourinary: Negative for dysuria. Musculoskeletal: Negative for back pain. Skin: Negative for rash. Neurological: Negative for headaches   ____________________________________________   PHYSICAL EXAM:  VITAL SIGNS: ED Triage Vitals  Enc Vitals Group     BP 08/03/18 0828 123/65     Pulse Rate 08/03/18 0828 98  Resp 08/03/18 0828 16     Temp 08/03/18 0828 (!) 97.3 F (36.3 C)     Temp src --      SpO2 08/03/18 0828 99 %     Weight 08/03/18 0829 88.9 kg (196 lb)     Height 08/03/18 0829 1.651 m (5\' 5" )     Head Circumference --      Peak Flow --      Pain Score 08/03/18 0828 10     Pain Loc --      Pain Edu? --      Excl. in GC? --     Constitutional: Alert and oriented.  Eyes: Conjunctivae are normal.   Nose: No  congestion/rhinnorhea. Mouth/Throat: Mucous membranes are moist.    Cardiovascular: Normal rate, regular rhythm. Grossly normal heart sounds.  Good peripheral circulation. Respiratory: Normal respiratory effort.  No retractions. Lungs CTAB. Gastrointestinal: Soft and nontender. No distention.  Reassuring exam  Musculoskeletal:  Warm and well perfused Neurologic:  Normal speech and language. No gross focal neurologic deficits are appreciated.  Skin:  Skin is warm, dry and intact. No rash noted. Psychiatric: Mood and affect are normal. Speech and behavior are normal.  ____________________________________________   LABS (all labs ordered are listed, but only abnormal results are displayed)  Labs Reviewed  COMPREHENSIVE METABOLIC PANEL - Abnormal; Notable for the following components:      Result Value   Glucose, Bld 131 (*)    All other components within normal limits  URINALYSIS, COMPLETE (UACMP) WITH MICROSCOPIC - Abnormal; Notable for the following components:   Color, Urine YELLOW (*)    APPearance HAZY (*)    Specific Gravity, Urine 1.034 (*)    Leukocytes,Ua MODERATE (*)    All other components within normal limits  CBC  LIPASE, BLOOD  POCT PREGNANCY, URINE   ____________________________________________  EKG  None ____________________________________________  RADIOLOGY  None ____________________________________________   PROCEDURES  Procedure(s) performed: No  Procedures   Critical Care performed: No ____________________________________________   INITIAL IMPRESSION / ASSESSMENT AND PLAN / ED COURSE  Pertinent labs & imaging results that were available during my care of the patient were reviewed by me and considered in my medical decision making (see chart for details).  Patient overall well-appearing and in no acute distress.  Abdominal exam is quite reassuring.  Lab work is unremarkable.  Suspect gastritis as the cause, will trial GI cocktail and  reevaluate.  Patient significantly improved after GI cocktail consistent with likely gastritis, will add Carafate, recommend outpatient follow-up with PCP, return precautions discussed    ____________________________________________   FINAL CLINICAL IMPRESSION(S) / ED DIAGNOSES  Final diagnoses:  Acute gastritis without hemorrhage, unspecified gastritis type        Note:  This document was prepared using Dragon voice recognition software and may include unintentional dictation errors.   Jene Every, MD 08/03/18 [phone]

## 2018-08-03 NOTE — ED Triage Notes (Signed)
Pt via pov from home with upper abdominal pain x 3-4 days. She reports that she went to her pcp and was told she had "acid problems" and was told to take probiotics. She reports she has had "acid problems" in pregnancy. She reports that she does not know if she is pregnant. Pt alert & oriented with NAD noted.

## 2018-11-28 IMAGING — CT CT ABD-PELV W/ CM
2 of 4 series · 16 of 46 positions shown, 18 images · IV contrast (APPLIED)
Comparison: None

CLINICAL DATA: Abdominal pain radiating around to back for 3 days,
last bowel movement yesterday, feels like abdomen is swollen

EXAM:
CT ABDOMEN AND PELVIS WITH CONTRAST
TECHNIQUE: Multidetector CT imaging of the abdomen and pelvis was performed
using the standard protocol following bolus administration of
intravenous contrast. Sagittal and coronal MPR images reconstructed
from axial data set.
CONTRAST:  100mL [id number] IOPAMIDOL (BQWPL8-6GG) INJECTION 61% IV.
Dilute oral contrast.

[Series 2: routine abd/pel with · axial · 0.98mm/px · z∈[-512,-27]mm · 13 of 107 slices shown, 15 images]
[im 5/107  soft-tissue]
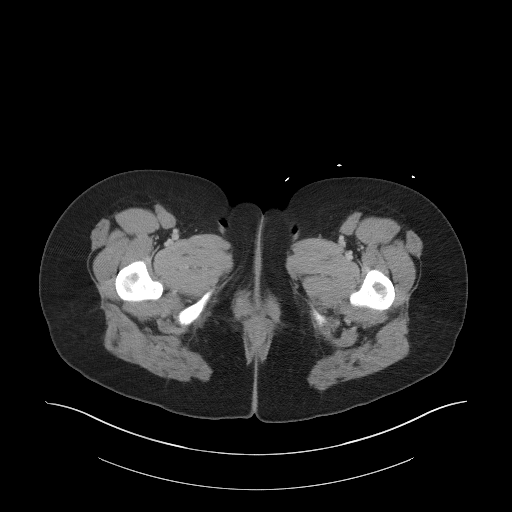
[im 5/107  bone]
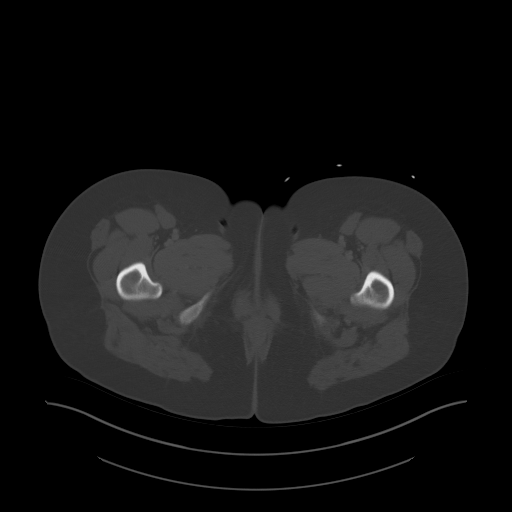
[im 13/107  soft-tissue]
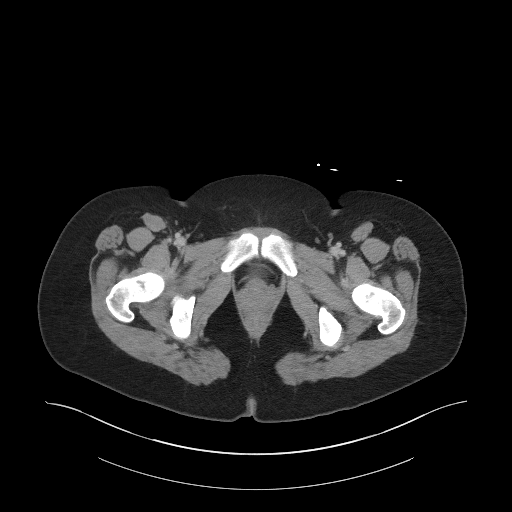
[im 22/107  soft-tissue]
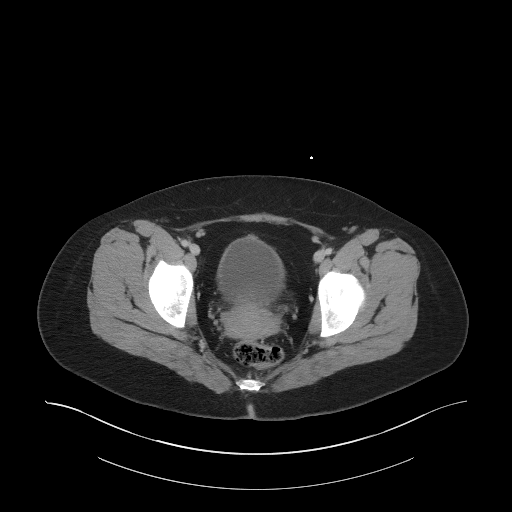
[im 30/107  soft-tissue]
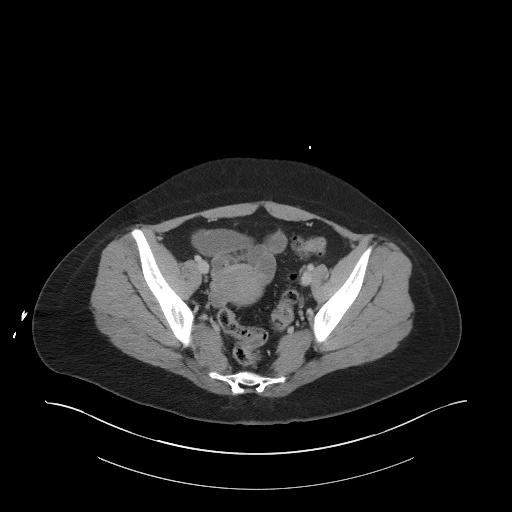
[im 39/107  soft-tissue]
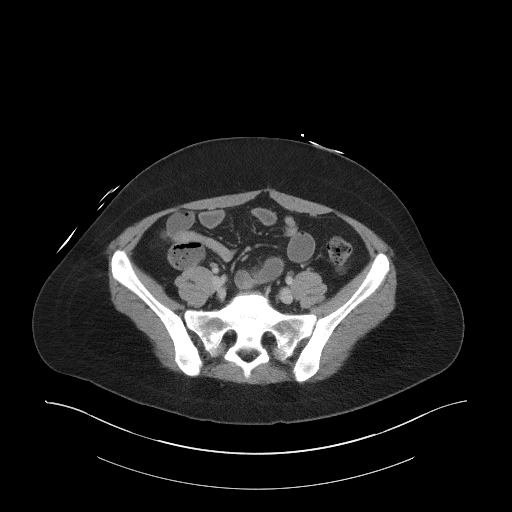
[im 47/107  soft-tissue]
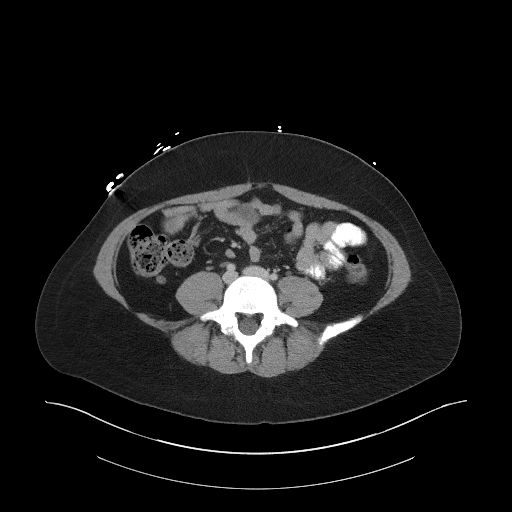
[im 56/107  soft-tissue]
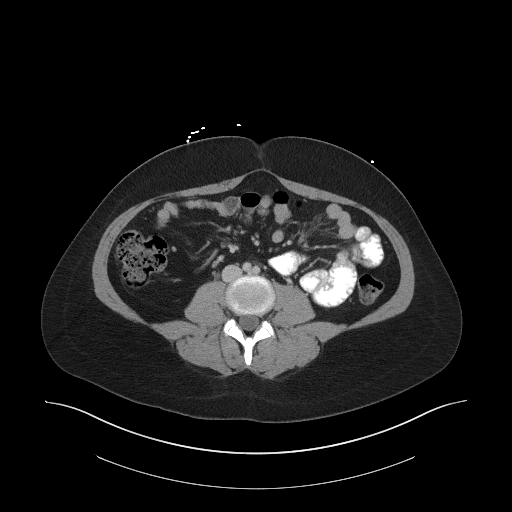
[im 60/107  soft-tissue]
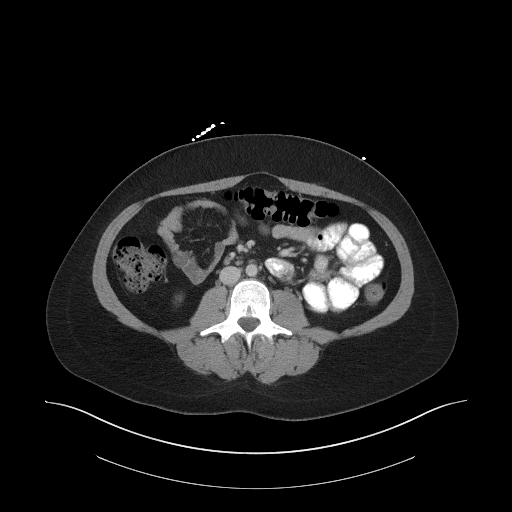
[im 68/107  soft-tissue]
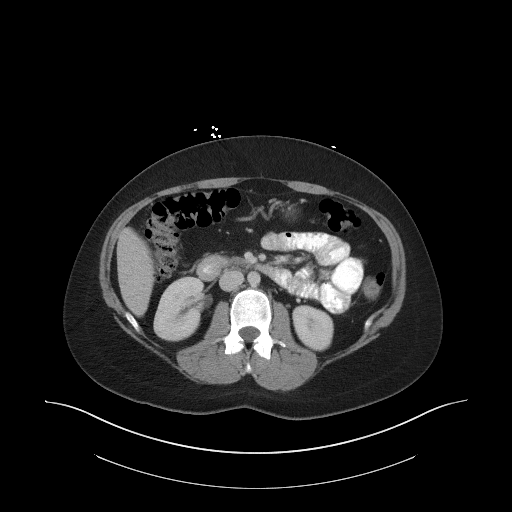
[im 68/107  bone]
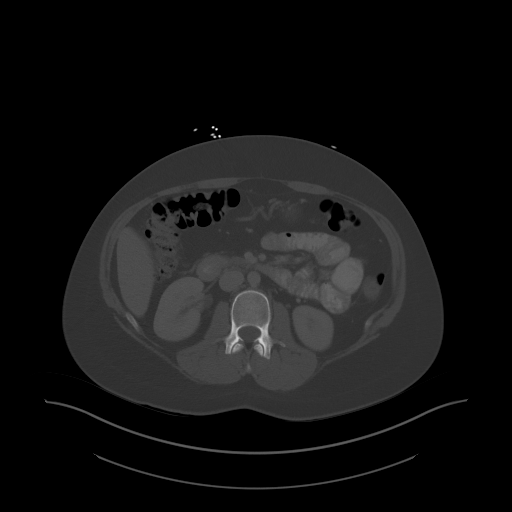
[im 77/107  soft-tissue]
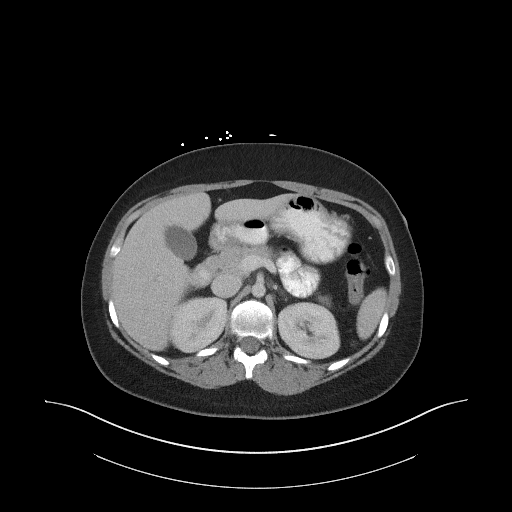
[im 85/107  soft-tissue]
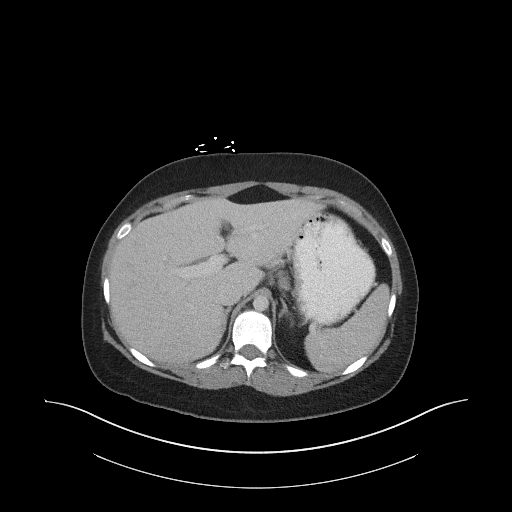
[im 94/107  soft-tissue]
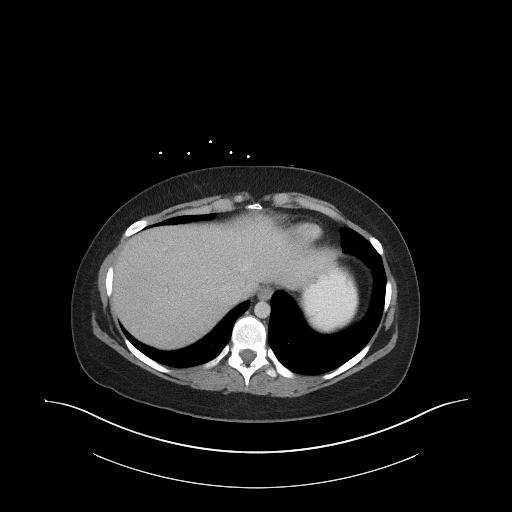
[im 102/107  soft-tissue]
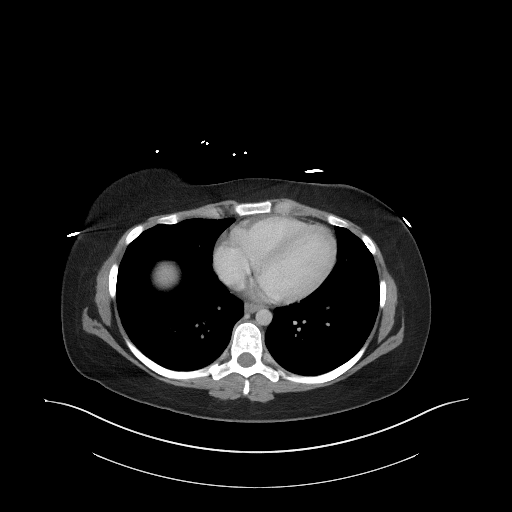

[Series 5: coronal st · coronal · 0.84mm/px · 3 of 100 slices shown]
[im 34/100  soft-tissue]
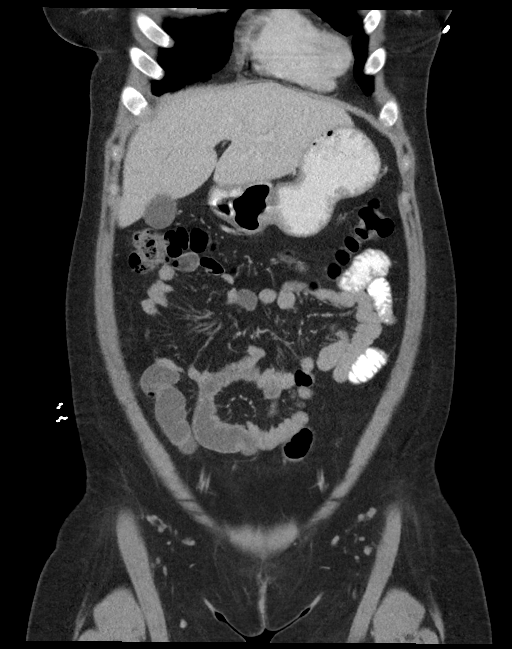
[im 45/100  soft-tissue]
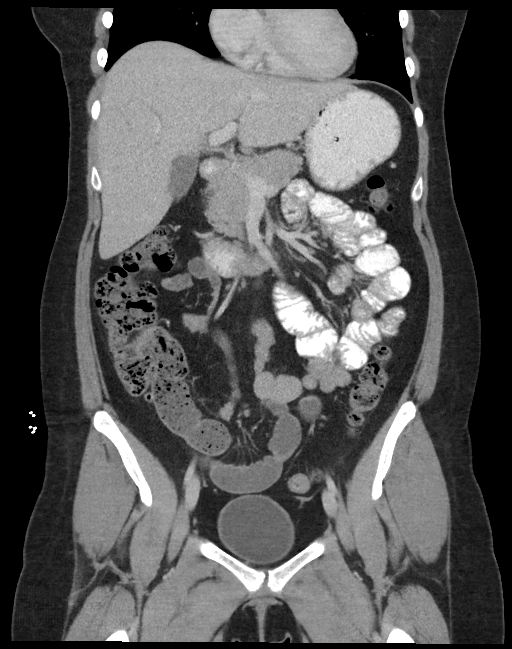
[im 56/100  soft-tissue]
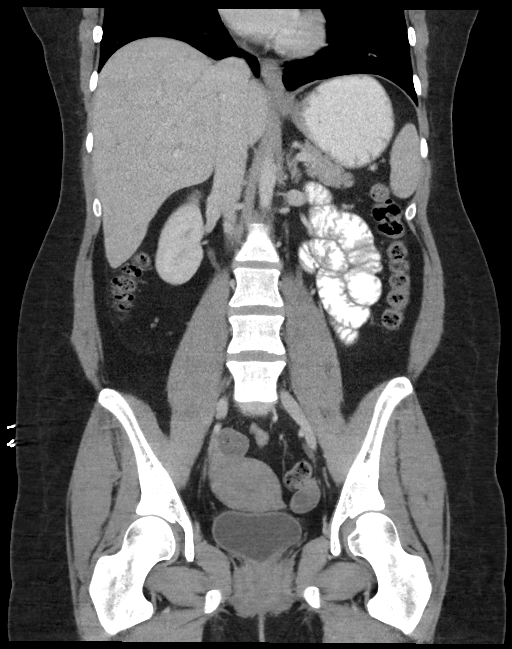

[16 of 46 positions shown; findings below may reference images not displayed]

FINDINGS: Lower chest: Lung bases clear

Hepatobiliary: Gallbladder and liver normal appearance

Pancreas: Normal appearance

Spleen: Normal appearance

Adrenals/Urinary Tract: Adrenal glands, kidneys, ureters, and
bladder normal appearance

Stomach/Bowel: Stomach and bowel loops normal appearance. Normal
appendix.

Vascular/Lymphatic: Vascular structures patent on non targeted exam.
Aorta normal caliber. No adenopathy. Scattered normal sized lymph
nodes in mesentery medial to RIGHT colon.

Reproductive: Unremarkable uterus and ovaries

Other: No free air or free fluid. Tiny umbilical hernia containing
fat. No acute inflammatory process identified.

Musculoskeletal: BILATERAL spondylolysis L5 without
spondylolisthesis.
IMPRESSION: No acute intra-abdominal or intrapelvic abnormalities.

BILATERAL spondylolysis L5 without spondylolisthesis.

## 2019-01-18 ENCOUNTER — Other Ambulatory Visit: Payer: Self-pay | Admitting: Advanced Practice Midwife

## 2019-01-18 ENCOUNTER — Other Ambulatory Visit: Payer: Self-pay | Admitting: Obstetrics and Gynecology

## 2019-01-18 NOTE — Telephone Encounter (Signed)
advise

## 2019-01-18 NOTE — Telephone Encounter (Signed)
Ideally she should see her PCP for blood pressure medication needs. I don't know that she still needs the BP med this far removed from her pregnancy.

## 2019-01-20 NOTE — Telephone Encounter (Signed)
Patient was originally given a 6 month Rx of Labetalol following her pregnancy. She was seen in the ER 3 months after the 6 months and had a normal blood pressure. She should follow up with her PCP for any concerns of chronic hypertension.

## 2019-12-01 ENCOUNTER — Other Ambulatory Visit: Payer: Self-pay

## 2019-12-01 DIAGNOSIS — F17210 Nicotine dependence, cigarettes, uncomplicated: Secondary | ICD-10-CM | POA: Diagnosis not present

## 2019-12-01 DIAGNOSIS — F1910 Other psychoactive substance abuse, uncomplicated: Secondary | ICD-10-CM | POA: Diagnosis not present

## 2019-12-01 DIAGNOSIS — F329 Major depressive disorder, single episode, unspecified: Secondary | ICD-10-CM | POA: Diagnosis not present

## 2019-12-01 DIAGNOSIS — Z20822 Contact with and (suspected) exposure to covid-19: Secondary | ICD-10-CM | POA: Insufficient documentation

## 2019-12-01 DIAGNOSIS — R4689 Other symptoms and signs involving appearance and behavior: Secondary | ICD-10-CM | POA: Diagnosis present
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
## 2019-12-02 ENCOUNTER — Emergency Department
Admission: EM | Admit: 2019-12-02 | Discharge: 2019-12-02 | Disposition: A | Discharge status: Other Acute Inpatient Hospital | Payer: Medicaid Other | Attending: Emergency Medicine | Admitting: Emergency Medicine

## 2019-12-02 ENCOUNTER — Inpatient Hospital Stay
Admission: EM | Admit: 2019-12-02 | Discharge: 2019-12-05 | DRG: 885 | Disposition: A | Discharge status: Home / Self Care | Payer: Medicaid Other | Source: Intra-hospital | Attending: Psychiatry | Admitting: Psychiatry

## 2019-12-02 DIAGNOSIS — Z590 Homelessness: Secondary | ICD-10-CM | POA: Diagnosis not present

## 2019-12-02 DIAGNOSIS — R45851 Suicidal ideations: Secondary | ICD-10-CM

## 2019-12-02 DIAGNOSIS — F411 Generalized anxiety disorder: Secondary | ICD-10-CM | POA: Diagnosis present

## 2019-12-02 DIAGNOSIS — F332 Major depressive disorder, recurrent severe without psychotic features: Secondary | ICD-10-CM | POA: Diagnosis present

## 2019-12-02 DIAGNOSIS — F17210 Nicotine dependence, cigarettes, uncomplicated: Secondary | ICD-10-CM | POA: Diagnosis present

## 2019-12-02 DIAGNOSIS — F1910 Other psychoactive substance abuse, uncomplicated: Secondary | ICD-10-CM

## 2019-12-02 DIAGNOSIS — F4320 Adjustment disorder, unspecified: Secondary | ICD-10-CM | POA: Diagnosis present

## 2019-12-02 DIAGNOSIS — F10220 Alcohol dependence with intoxication, uncomplicated: Secondary | ICD-10-CM | POA: Diagnosis present

## 2019-12-02 DIAGNOSIS — F14120 Cocaine abuse with intoxication, uncomplicated: Secondary | ICD-10-CM

## 2019-12-02 DIAGNOSIS — F1210 Cannabis abuse, uncomplicated: Secondary | ICD-10-CM | POA: Diagnosis present

## 2019-12-02 DIAGNOSIS — F1420 Cocaine dependence, uncomplicated: Secondary | ICD-10-CM | POA: Diagnosis present

## 2019-12-02 DIAGNOSIS — Z62820 Parent-biological child conflict: Secondary | ICD-10-CM | POA: Diagnosis present

## 2019-12-02 DIAGNOSIS — F329 Major depressive disorder, single episode, unspecified: Secondary | ICD-10-CM | POA: Diagnosis present

## 2019-12-02 DIAGNOSIS — F1220 Cannabis dependence, uncomplicated: Secondary | ICD-10-CM | POA: Diagnosis present

## 2019-12-02 DIAGNOSIS — F909 Attention-deficit hyperactivity disorder, unspecified type: Secondary | ICD-10-CM | POA: Diagnosis present

## 2019-12-02 DIAGNOSIS — F10129 Alcohol abuse with intoxication, unspecified: Secondary | ICD-10-CM

## 2019-12-02 DIAGNOSIS — F17200 Nicotine dependence, unspecified, uncomplicated: Secondary | ICD-10-CM | POA: Diagnosis present

## 2019-12-02 DIAGNOSIS — Z720 Tobacco use: Secondary | ICD-10-CM | POA: Diagnosis present

## 2019-12-02 LAB — CBC
HCT: 40.9 % (ref 36.0–46.0)
Hemoglobin: 13.9 g/dL (ref 12.0–15.0)
MCH: 31.6 pg (ref 26.0–34.0)
MCHC: 34 g/dL (ref 30.0–36.0)
MCV: 93 fL (ref 80.0–100.0)
Platelets: 227 10*3/uL (ref 150–400)
RBC: 4.4 MIL/uL (ref 3.87–5.11)
RDW: 12.2 % (ref 11.5–15.5)
WBC: 13.9 10*3/uL — ABNORMAL HIGH (ref 4.0–10.5)
nRBC: 0 % (ref 0.0–0.2)

## 2019-12-02 LAB — COMPREHENSIVE METABOLIC PANEL
ALT: 125 U/L — ABNORMAL HIGH (ref 0–44)
AST: 62 U/L — ABNORMAL HIGH (ref 15–41)
Albumin: 4.2 g/dL (ref 3.5–5.0)
Alkaline Phosphatase: 115 U/L (ref 38–126)
Anion gap: 13 (ref 5–15)
BUN: 8 mg/dL (ref 6–20)
CO2: 18 mmol/L — ABNORMAL LOW (ref 22–32)
Calcium: 8.6 mg/dL — ABNORMAL LOW (ref 8.9–10.3)
Chloride: 106 mmol/L (ref 98–111)
Creatinine, Ser: 0.79 mg/dL (ref 0.44–1.00)
GFR calc Af Amer: >60 mL/min (ref >60)
GFR calc non Af Amer: >60 mL/min (ref >60)
Glucose, Bld: 128 mg/dL — ABNORMAL HIGH (ref 70–99)
Potassium: 3.1 mmol/L — ABNORMAL LOW (ref 3.5–5.1)
Sodium: 137 mmol/L (ref 135–145)
Total Bilirubin: 0.7 mg/dL (ref 0.3–1.2)
Total Protein: 7.3 g/dL (ref 6.5–8.1)

## 2019-12-02 LAB — URINE DRUG SCREEN, QUALITATIVE (ARMC ONLY)
Amphetamines, Ur Screen: NOT DETECTED
Barbiturates, Ur Screen: NOT DETECTED
Benzodiazepine, Ur Scrn: POSITIVE — AB
Cannabinoid 50 Ng, Ur ~~LOC~~: POSITIVE — AB
Cocaine Metabolite,Ur ~~LOC~~: POSITIVE — AB
MDMA (Ecstasy)Ur Screen: NOT DETECTED
Methadone Scn, Ur: NOT DETECTED
Opiate, Ur Screen: NOT DETECTED
Phencyclidine (PCP) Ur S: NOT DETECTED
Tricyclic, Ur Screen: NOT DETECTED

## 2019-12-02 LAB — SARS CORONAVIRUS 2 BY RT PCR (HOSPITAL ORDER, PERFORMED IN ~~LOC~~ HOSPITAL LAB): SARS Coronavirus 2: NEGATIVE

## 2019-12-02 LAB — TSH: TSH: 1.988 u[IU]/mL (ref 0.350–4.500)

## 2019-12-02 LAB — ACETAMINOPHEN LEVEL: Acetaminophen (Tylenol), Serum: <10 ug/mL — ABNORMAL LOW (ref 10–30)

## 2019-12-02 LAB — ETHANOL: Alcohol, Ethyl (B): 97 mg/dL — ABNORMAL HIGH (ref <10)

## 2019-12-02 LAB — SALICYLATE LEVEL: Salicylate Lvl: <7 mg/dL — ABNORMAL LOW (ref 7.0–30.0)

## 2019-12-02 MED ORDER — SODIUM CHLORIDE 0.9 % IV BOLUS
1000.0000 mL | Freq: Once | INTRAVENOUS | Status: AC
  Administered 2019-12-02: 1000 mL via INTRAVENOUS

## 2019-12-02 MED ORDER — ACETAMINOPHEN 325 MG PO TABS
650.0000 mg | ORAL_TABLET | Freq: Four times a day (QID) | ORAL | Status: DC | PRN
  Administered 2019-12-02 – 2019-12-05 (×6): 650 mg via ORAL
  Filled 2019-12-02 (×6): qty 2

## 2019-12-02 MED ORDER — ALUM & MAG HYDROXIDE-SIMETH 200-200-20 MG/5ML PO SUSP: 30.0000 mL | ORAL | Status: DC | PRN

## 2019-12-02 MED ORDER — NICOTINE 21 MG/24HR TD PT24
21.0000 mg | MEDICATED_PATCH | Freq: Once | TRANSDERMAL | Status: AC
  Administered 2019-12-02: 21 mg via TRANSDERMAL
  Filled 2019-12-02: qty 1

## 2019-12-02 MED ORDER — NICOTINE 21 MG/24HR TD PT24
21.0000 mg | MEDICATED_PATCH | Freq: Once | TRANSDERMAL | Status: DC
  Administered 2019-12-02: 21 mg via TRANSDERMAL
  Filled 2019-12-02: qty 1

## 2019-12-02 MED ORDER — MAGNESIUM HYDROXIDE 400 MG/5ML PO SUSP
30.0000 mL | Freq: Every day | ORAL | Status: DC | PRN
  Administered 2019-12-03: 30 mL via ORAL
  Filled 2019-12-02: qty 30

## 2019-12-02 MED ORDER — TRAZODONE HCL 50 MG PO TABS
50.0000 mg | ORAL_TABLET | Freq: Every evening | ORAL | Status: DC | PRN
  Administered 2019-12-02 – 2019-12-04 (×3): 50 mg via ORAL
  Filled 2019-12-02 (×3): qty 1
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
## 2019-12-02 NOTE — [facility] Group Notes (Signed)
[facility] Group Notes:  (Nursing/MHT/Case Management/Adjunct)  Date:  12/02/2019  Time:  10:55 PM  Type of Therapy:  Group Therapy  Participation Level:  Active  Participation Quality:  Appropriate  Affect:  Appropriate  Cognitive:  Alert and Appropriate  Insight:  Appropriate and Good  Engagement in Group:  Engaged  Modes of Intervention:  Goals/Wrap up group  Summary of Progress/Problems: MHT conducted goals and wrap up group encouraging patient to participate in setting goals daily in order increase self-fulfillment. Patient stated that her goal for the day is to get out of her room more. Patient agrees that she met this goal for the day by participating and getting up even during the times she didn't feel like it. Patient states that she is going to continue working towards this goal even outside of the facility. Patient also expresses at this point and time she is doing alright mentally and physically  Susan [name] 12/02/2019, 10:55 PM
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
## 2019-12-02 NOTE — BH Assessment (Signed)
PATIENT BED AVAILABLE AFTER 8:30AM PENDING NEGATIVE COVID RESULTS  Patient is to be admitted to [facility] by Psychiatric Nurse Practitioner Gillermo Murdoch.  Attending Physician will be Dr. Toni Amend.   Patient has been assigned to room 319, by [facility] Charge Nurse [place].    ER staff is aware of the admission:  [facility] ER Secretary    Dr. Manson Passey, ER MD   Dewayne Hatch Patient's Nurse   Vikki Ports Patient Access.

## 2019-12-02 NOTE — Consult Note (Signed)
[facility] Face-to-Face Psychiatry Consult   Reason for Consult: Behavior Problem Referring Physician:  Dr Manson Passey Patient Identification: Susan [name] MRN:  [id number] Principal Diagnosis: <principal problem not specified> Diagnosis:  Active Problems:   Tobacco abuse   Marijuana abuse   MDD (major depressive disorder), recurrent episode, severe (HCC)   Total Time spent with patient: 30 minutes  Subjective: "They took my 78-year-old daughter away from [place] last weekend.  I have no one." Susan [name] is a 24 y.o. female patient presented to [facility] ED via law enforcement under involuntary commitment status (IVC).  Per the ED triage nurse note, the patient was brought in for a suicide attempt. The patient sister took out papers after observing the patient attempting to run in front of a train to commit suicide.  During the patient initial psychiatric assessment, she voiced not having anybody who cares about her.  She stated, "They took my 68-year-old daughter away from me last weekend.  It was because of my old man."  The patient voice her child was taken away from her and given to a "loving person" to care for her.  The patient presents to be delayed when answering questions.  Her UDS shows her to be positive for cocaine, cannabinoid, and benzodiazepine.  Her BAL is 97 mg/dl on today's visit.  The patient denies having psychiatric inpatient admission.  She denies seeing a psychiatrist, therapist or been prescribed any psychiatric medication. The patient was seen face-to-face by this provider; the chart was reviewed and consulted with Dr. Manson Passey on 12/02/2019 due to the patient's care. It was discussed with the EDP that the patient does meet the criteria to be admitted to the psychiatric inpatient unit.  The patient is alert and oriented x 4, calm, cooperative, and mood-congruent with affect on evaluation.  The patient does not appear to be responding to internal or external stimuli. Neither is the  patient presenting with any delusional thinking. The patient denies auditory or visual hallucinations. The patient admitted to suicidal ideation but denied homicidal or self-harm ideations. The patient is not presenting with any psychotic or paranoid behaviors. During an encounter with the patient, she was able to answer questions appropriately.  Plan: The patient is a safety risk to self and requires psychiatric inpatient admission for stabilization and treatment. HPI: Per Dr. Manson Passey: Susan [name] is a 24 y.o. female with below list of previous medical conditions including ADHD depression and previous suicide ideation presents to the emergency department secondary to involuntary commitment due to thoughts of self-harm.  Officer states that the patient attempted to run in front of a train today.  Patient does admit to suicidal ideation.  Patient also admits alcohol use this evening as well as cocaine.  Patient denies any pain no trauma.  Patient denies any dyspnea.  Past Psychiatric History:  ADHD (attention deficit hyperactivity disorder) Depression  Risk to Self:  Yes Risk to Others:  No Prior Inpatient Therapy:  No Prior Outpatient Therapy:  No  Past Medical History:  Past Medical History:  Diagnosis Date   ADHD (attention deficit hyperactivity disorder)    Depression    Preeclampsia, severe, third trimester    HELLP Syndrome    Past Surgical History:  Procedure Laterality Date   NO PAST SURGERIES     Family History:  Family History  Problem Relation Age of Onset   SIDS Son    Family Psychiatric  History: Patient is adopted Social History:  Social History   Substance and  Sexual Activity  Alcohol Use Not Currently   Comment: vodka     Social History   Substance and Sexual Activity  Drug Use Yes   Frequency: 1.0 times per week   Types: Marijuana    Social History   Socioeconomic History   Marital status: Single    Spouse name: Not on file   Number of  children: 0   Years of education: Not on file   Highest education level: Not on file  Occupational History   Not on file  Tobacco Use   Smoking status: Current Every Day Smoker    Packs/day: 0.50    Types: Cigarettes   Smokeless tobacco: Never Used  Vaping Use   Vaping Use: Never used  Substance and Sexual Activity   Alcohol use: Not Currently    Comment: vodka   Drug use: Yes    Frequency: 1.0 times per week    Types: Marijuana   Sexual activity: Yes  Other Topics Concern   Not on file  Social History Narrative   Not on file   Social Determinants of Health   Financial Resource Strain:    Difficulty of Paying Living Expenses:   Food Insecurity:    Worried About Programme researcher, broadcasting/film/videounning Out of Food in the Last Year:    Baristaan Out of Food in the Last Year:   Transportation Needs:    Freight forwarderLack of Transportation (Medical):    Lack of Transportation (Non-Medical):   Physical Activity:    Days of Exercise per Week:    Minutes of Exercise per Session:   Stress:    Feeling of Stress :   Social Connections:    Frequency of Communication with Friends and Family:    Frequency of Social Gatherings with Friends and Family:    Attends Religious Services:    Active Member of Clubs or Organizations:    Attends BankerClub or Organization Meetings:    Marital Status:    Additional Social History:    Allergies:  No Known Allergies  Labs:  Results for orders placed or performed during the hospital encounter of 12/02/19 (from the past 48 hour(s))  Comprehensive metabolic panel     Status: Abnormal   Collection Time: 12/01/19 11:50 PM  Result Value Ref Range   Sodium 137 135 - 145 mmol/L   Potassium 3.1 (L) 3.5 - 5.1 mmol/L   Chloride 106 98 - 111 mmol/L   CO2 18 (L) 22 - 32 mmol/L   Glucose, Bld 128 (H) 70 - 99 mg/dL    Comment: Glucose reference range applies only to samples taken after fasting for at least 8 hours.   BUN 8 6 - 20 mg/dL   Creatinine, Ser 1.610.79 0.44 - 1.00 mg/dL    Calcium 8.6 (L) 8.9 - 10.3 mg/dL   Total Protein 7.3 6.5 - 8.1 g/dL   Albumin 4.2 3.5 - 5.0 g/dL   AST 62 (H) 15 - 41 U/L   ALT 125 (H) 0 - 44 U/L   Alkaline Phosphatase 115 38 - 126 U/L   Total Bilirubin 0.7 0.3 - 1.2 mg/dL   GFR calc non Af Amer >60 >60 mL/min   GFR calc Af Amer >60 >60 mL/min   Anion gap 13 5 - 15    Comment: Performed at [facility], [address] [place], [place] [address]  Ethanol     Status: Abnormal   Collection Time: 12/01/19 11:50 PM  Result Value Ref Range   Alcohol, Ethyl (B) 97 (  H) <10 mg/dL    Comment: (NOTE) Lowest detectable limit for serum alcohol is 10 mg/dL.  For medical purposes only. Performed at [facility], [address] [place], [place] [address]   Salicylate level     Status: Abnormal   Collection Time: 12/01/19 11:50 PM  Result Value Ref Range   Salicylate Lvl <7.0 (L) 7.0 - 30.0 mg/dL    Comment: Performed at [facility], [address] [place], [place] [address]  Acetaminophen level     Status: Abnormal   Collection Time: 12/01/19 11:50 PM  Result Value Ref Range   Acetaminophen (Tylenol), Serum <10 (L) 10 - 30 ug/mL    Comment: (NOTE) Therapeutic concentrations vary significantly. A range of 10-30 ug/mL  may be an effective concentration for many patients. However, some  are best treated at concentrations outside of this range. Acetaminophen concentrations >150 ug/mL at 4 hours after ingestion  and >50 ug/mL at 12 hours after ingestion are often associated with  toxic reactions.  Performed at [facility], [address] [place], [place] [address]   cbc     Status: Abnormal   Collection Time: 12/01/19 11:50 PM  Result Value Ref Range   WBC 13.9 (H) 4.0 - 10.5 K/uL   RBC 4.40 3.87 - 5.11 MIL/uL   Hemoglobin 13.9 12.0 - 15.0 g/dL   HCT 37.6 36 - 46 %   MCV 93.0 80.0 - 100.0 fL   MCH 31.6 26.0 - 34.0 pg   MCHC 34.0 30.0 - 36.0 g/dL   RDW 28.3 15.1 - 76.1 %   Platelets  227 150 - 400 K/uL   nRBC 0.0 0.0 - 0.2 %    Comment: Performed at [facility], [address], [place], [place] [address]  Urine Drug Screen, Qualitative     Status: Abnormal   Collection Time: 12/01/19 11:50 PM  Result Value Ref Range   Tricyclic, Ur Screen NONE DETECTED NONE DETECTED   Amphetamines, Ur Screen NONE DETECTED NONE DETECTED   MDMA (Ecstasy)Ur Screen NONE DETECTED NONE DETECTED   Cocaine Metabolite,Ur [place] POSITIVE (A) NONE DETECTED   Opiate, Ur Screen NONE DETECTED NONE DETECTED   Phencyclidine (PCP) Ur S NONE DETECTED NONE DETECTED   Cannabinoid 50 Ng, Ur [place] POSITIVE (A) NONE DETECTED   Barbiturates, Ur Screen NONE DETECTED NONE DETECTED   Benzodiazepine, Ur Scrn POSITIVE (A) NONE DETECTED   Methadone Scn, Ur NONE DETECTED NONE DETECTED    Comment: (NOTE) Tricyclics + metabolites, urine    Cutoff 1000 ng/mL Amphetamines + metabolites, urine  Cutoff 1000 ng/mL MDMA (Ecstasy), urine              Cutoff 500 ng/mL Cocaine Metabolite, urine          Cutoff 300 ng/mL Opiate + metabolites, urine        Cutoff 300 ng/mL Phencyclidine (PCP), urine         Cutoff 25 ng/mL Cannabinoid, urine                 Cutoff 50 ng/mL Barbiturates + metabolites, urine  Cutoff 200 ng/mL Benzodiazepine, urine              Cutoff 200 ng/mL Methadone, urine                   Cutoff 300 ng/mL  The urine drug screen provides only a preliminary, unconfirmed analytical test result and should not be used for non-medical purposes. Clinical consideration  and professional judgment should be applied to any positive drug screen result due to possible interfering substances. A more specific alternate chemical method must be used in order to obtain a confirmed analytical result. Gas chromatography / mass spectrometry (GC/MS) is the preferred confirm atory method. Performed at [facility], [address] [place], [place] [address]   TSH     Status: None   Collection Time:  12/01/19 11:50 PM  Result Value Ref Range   TSH 1.988 0.350 - 4.500 uIU/mL    Comment: Performed by a 3rd Generation assay with a functional sensitivity of <=0.01 uIU/mL. Performed at [facility], [address] [place], [place] [address]     Current Facility-Administered Medications  Medication Dose Route Frequency Provider Last Rate Last Admin   nicotine (NICODERM CQ - dosed in mg/24 hours) patch 21 mg  21 mg Transdermal Once Darci Current, MD   21 mg at 12/02/19 0126   Current Outpatient Medications  Medication Sig Dispense Refill   HYDROcodone-acetaminophen (NORCO/VICODIN) 5-325 MG tablet Take 1 tablet by mouth every 4 (four) hours as needed. (Patient not taking: Reported on 11/09/2017) 12 tablet 0   hydrocortisone-pramoxine (ANALPRAM HC) 2.5-1 % rectal cream Place 1 application rectally 3 (three) times daily. 30 g 0   labetalol (NORMODYNE) 200 MG tablet Take 1 tablet (200 mg total) by mouth 3 (three) times daily. 90 tablet 1   ondansetron (ZOFRAN ODT) 4 MG disintegrating tablet Take 1 tablet (4 mg total) by mouth every 8 (eight) hours as needed. 20 tablet 0   Prenat-FeFum-FePo-FA-Omega 3 (CONCEPT DHA) 53.5-38-1 MG CAPS TK 1 C PO D.  11   sucralfate (CARAFATE) 1 g tablet Take 1 tablet (1 g total) by mouth 4 (four) times daily. 40 tablet 0    Musculoskeletal: Strength & Muscle Tone: within normal limits Gait & Station: normal Patient leans: N/A  Psychiatric Specialty Exam: Physical Exam Psychiatric:        Attention and Perception: Attention and perception normal.        Mood and Affect: Mood is depressed. Affect is blunt and flat.        Speech: Speech is delayed.        Behavior: Behavior normal.        Thought Content: Thought content normal.        Cognition and Memory: Cognition normal.        Judgment: Judgment normal.     Review of Systems  Psychiatric/Behavioral: Positive for sleep disturbance and suicidal ideas. The patient is nervous/anxious.    All other systems reviewed and are negative.   Blood pressure 124/73, pulse (!) 116, temperature 98.1 F (36.7 C), temperature source Oral, resp. rate 18, height 5\' 4"  (1.626 m), weight 86.2 kg, SpO2 98 %, unknown if currently breastfeeding.Body mass index is 32.61 kg/m.  General Appearance: Bizarre  Eye Contact:  Poor  Speech:  Clear and Coherent and Slow  Volume:  Decreased  Mood:  Depressed and Hopeless  Affect:  Blunt, Congruent, Depressed, Flat and Inappropriate  Thought Process:  Coherent  Orientation:  Full (Time, Place, and Person)  Thought Content:  Logical  Suicidal Thoughts:  Yes.  without intent/plan  Homicidal Thoughts:  No  Memory:  Immediate;   Fair Recent;   Fair Remote;   Fair  Judgement:  Impaired  Insight:  Lacking  Psychomotor Activity:  Normal  Concentration:  Concentration: Good and Attention Span: Good  Recall:  of Knowledge:  Fair  Language:  Fair  Akathisia:  Negative  Handed:  Right  AIMS (if indicated):     Assets:  Communication Skills Desire for Improvement Housing Resilience Social Support  ADL's:  Intact  Cognition:  WNL  Sleep:    Insomnia     Treatment Plan Summary: Plan Patient meets criteria for psychiatric inpatient admission.  Disposition: Recommend psychiatric Inpatient admission when medically cleared. Supportive therapy provided about ongoing stressors.  Gillermo Murdoch, NP 12/02/2019 3:50 AM

## 2019-12-02 NOTE — ED Provider Notes (Signed)
[facility] Emergency Department Provider Note  ____________________________________________   First MD Initiated Contact with Patient 12/02/19 0002     (approximate)  I have reviewed the triage vital signs and the nursing notes.   HISTORY  Chief Complaint Behavior Problem   HPI Susan [name] is a 24 y.o. female with below list of previous medical conditions including ADHD depression and previous suicide ideation presents to the emergency department secondary to involuntary commitment due to thoughts of self-harm.  Officer states that the patient attempted to run in front of a train today.  Patient does admit to suicidal ideation.  Patient also admits alcohol use this evening as well as cocaine.  Patient denies any pain no trauma.  Patient denies any dyspnea.        Past Medical History:  Diagnosis Date  . ADHD (attention deficit hyperactivity disorder)   . Depression   . Preeclampsia, severe, third trimester    HELLP Syndrome    Patient Active Problem List   Diagnosis Date Noted  . Preeclampsia in postpartum period 11/09/2017  . Postpartum care following vaginal delivery 11/06/2017  . Labor and delivery, indication for care 11/03/2017  . Elevated glucose tolerance test 08/24/2017  . Tobacco smoking affecting pregnancy in third trimester 08/21/2017  . Family history of SIDS (sudden infant death syndrome) May 14, 2017  . Supervision of high risk pregnancy, antepartum 04/06/2017  . History of pre-eclampsia 04/06/2017  . Tobacco abuse 12/07/2016  . Marijuana abuse 12/07/2016  . Abnormal liver function tests 09/18/2016  . Chronic headaches 09/18/2016    Past Surgical History:  Procedure Laterality Date  . NO PAST SURGERIES      Prior to Admission medications   Medication Sig Start Date End Date Taking? Authorizing Provider  HYDROcodone-acetaminophen (NORCO/VICODIN) 5-325 MG tablet Take 1 tablet by mouth every 4 (four) hours as  needed. Patient not taking: Reported on 11/09/2017 11/08/17   Minna Antis, MD  hydrocortisone-pramoxine [facility]) 2.5-1 % rectal cream Place 1 application rectally 3 (three) times daily. 11/08/17   Minna Antis, MD  labetalol (NORMODYNE) 200 MG tablet Take 1 tablet (200 mg total) by mouth 3 (three) times daily. 11/06/17   Tresea Mall, CNM  ondansetron (ZOFRAN ODT) 4 MG disintegrating tablet Take 1 tablet (4 mg total) by mouth every 8 (eight) hours as needed. 08/03/18   Jene Every, MD  Prenat-FeFum-FePo-FA-Omega 3 (CONCEPT DHA) 53.5-38-1 MG CAPS TK 1 C PO D. 10/13/17   [provider]  sucralfate (CARAFATE) 1 g tablet Take 1 tablet (1 g total) by mouth 4 (four) times daily. 08/03/18 08/03/19  Jene Every, MD    Allergies Patient has no known allergies.  Family History  Problem Relation Age of Onset  . SIDS Son     Social History Social History   Tobacco Use  . Smoking status: Current Every Day Smoker    Packs/day: 0.50    Types: Cigarettes  . Smokeless tobacco: Never Used  Vaping Use  . Vaping Use: Never used  Substance Use Topics  . Alcohol use: Not Currently    Comment: vodka  . Drug use: Yes    Frequency: 1.0 times per week    Types: Marijuana    Review of Systems Constitutional: No fever/chills Eyes: No visual changes. ENT: No sore throat. Cardiovascular: Denies chest pain. Respiratory: Denies shortness of breath. Gastrointestinal: No abdominal pain.  No nausea, no vomiting.  No diarrhea.  No constipation. Genitourinary: Negative for dysuria. Musculoskeletal: Negative for neck pain.  Negative for back pain. Integumentary: Negative for rash. Neurological: Negative for headaches, focal weakness or numbness. Psychiatric:  Positive for substance use and suicidal ideation.   ____________________________________________   PHYSICAL EXAM:  VITAL SIGNS: ED Triage Vitals  Enc Vitals Group     BP 12/02/19 0000 124/73     Pulse Rate 12/02/19  0000 (!) 127     Resp 12/02/19 0000 18     Temp 12/02/19 0000 98.1 F (36.7 C)     Temp Source 12/02/19 0000 Oral     SpO2 12/02/19 0000 97 %     Weight 12/01/19 2354 86.2 kg (190 lb)     Height 12/01/19 2354 1.626 m (5\' 4" )     Head Circumference --      Peak Flow --      Pain Score 12/01/19 2353 0     Pain Loc --      Pain Edu? --      Excl. in GC? --     Constitutional: Alert and oriented.  Appears intoxicated Eyes: Conjunctivae are normal.  Head: Atraumatic. Mouth/Throat: Patient is wearing a mask. Neck: No stridor.  No meningeal signs.   Cardiovascular: Normal rate, regular rhythm. Good peripheral circulation. Grossly normal heart sounds. Respiratory: Normal respiratory effort.  No retractions. Gastrointestinal: Soft and nontender. No distention.  Musculoskeletal: No lower extremity tenderness nor edema. No gross deformities of extremities. Neurologic:  Normal speech and language. No gross focal neurologic deficits are appreciated.  Skin:  Skin is warm, dry and intact. Psychiatric: Depressed affect speech and behavior are normal.    Procedures   ____________________________________________   INITIAL IMPRESSION / MDM / ASSESSMENT AND PLAN / ED COURSE  As part of my medical decision making, I reviewed the following data within the electronic MEDICAL RECORD NUMBER   24 year old female presented with above-stated history and physical exam differential diagnosis including but not limited to major depressive disorder, bipolar disorder, substance-induced mood disorder.  Awaiting psychiatry evaluation and disposition.  ____________________________________________  FINAL CLINICAL IMPRESSION(S) / ED DIAGNOSES  Final diagnoses:  Polysubstance abuse (HCC)  Suicidal ideation     MEDICATIONS GIVEN DURING THIS VISIT:  Medications - No data to display   ED Discharge Orders    None      *Please note:  Susan [name] was evaluated in Emergency Department on 12/02/2019  for the symptoms described in the history of present illness. She was evaluated in the context of the global COVID-19 pandemic, which necessitated consideration that the patient might be at risk for infection with the SARS-CoV-2 virus that causes COVID-19. Institutional protocols and algorithms that pertain to the evaluation of patients at risk for COVID-19 are in a state of rapid change based on information released by regulatory bodies including the CDC and federal and state organizations. These policies and algorithms were followed during the patient's care in the ED.  Some ED evaluations and interventions may be delayed as a result of limited staffing during and after the pandemic.*  Note:  This document was prepared using Dragon voice recognition software and may include unintentional dictation errors.   12/04/2019, MD 12/02/19 [phone]

## 2019-12-02 NOTE — ED Notes (Signed)
Lab called to verify they have Covid swab.  Lab verifies swab is in lab.  Order processed at this time.

## 2019-12-03 LAB — PREGNANCY, URINE: Preg Test, Ur: NEGATIVE

## 2019-12-03 NOTE — Plan of Care (Signed)
  Problem: Education: Goal: Utilization of techniques to improve thought processes will improve Outcome: Not Progressing Goal: Knowledge of the prescribed therapeutic regimen will improve Outcome: Not Progressing   Problem: Activity: Goal: Interest or engagement in leisure activities will improve Outcome: Not Progressing Goal: Imbalance in normal sleep/wake cycle will improve Outcome: Not Progressing   Problem: Coping: Goal: Coping ability will improve Outcome: Not Progressing Goal: Will verbalize feelings Outcome: Not Progressing   Problem: Health Behavior/Discharge Planning: Goal: Ability to make decisions will improve Outcome: Not Progressing Goal: Compliance with therapeutic regimen will improve Outcome: Not Progressing   Problem: Role Relationship: Goal: Will demonstrate positive changes in social behaviors and relationships Outcome: Not Progressing   Problem: Safety: Goal: Ability to disclose and discuss suicidal ideas will improve Outcome: Not Progressing Goal: Ability to identify and utilize support systems that promote safety will improve Outcome: Not Progressing   Problem: Self-Concept: Goal: Will verbalize positive feelings about self Outcome: Not Progressing Goal: Level of anxiety will decrease Outcome: Not Progressing   Problem: Education: Goal: Knowledge of [facility] Education information/materials will improve Outcome: Not Progressing Goal: Emotional status will improve Outcome: Not Progressing Goal: Mental status will improve Outcome: Not Progressing Goal: Verbalization of understanding the information provided will improve Outcome: Not Progressing   Problem: Activity: Goal: Interest or engagement in activities will improve Outcome: Not Progressing Goal: Sleeping patterns will improve Outcome: Not Progressing   Problem: Coping: Goal: Ability to verbalize frustrations and anger appropriately will improve Outcome: Not  Progressing Goal: Ability to demonstrate self-control will improve Outcome: Not Progressing   Problem: Health Behavior/Discharge Planning: Goal: Identification of resources available to assist in meeting health care needs will improve Outcome: Not Progressing Goal: Compliance with treatment plan for underlying cause of condition will improve Outcome: Not Progressing   Problem: Physical Regulation: Goal: Ability to maintain clinical measurements within normal limits will improve Outcome: Not Progressing   Problem: Safety: Goal: Periods of time without injury will increase Outcome: Not Progressing   Problem: Education: Goal: Ability to make informed decisions regarding treatment will improve Outcome: Not Progressing   Problem: Coping: Goal: Coping ability will improve Outcome: Not Progressing   Problem: Health Behavior/Discharge Planning: Goal: Identification of resources available to assist in meeting health care needs will improve Outcome: Not Progressing   Problem: Medication: Goal: Compliance with prescribed medication regimen will improve Outcome: Not Progressing   Problem: Self-Concept: Goal: Ability to disclose and discuss suicidal ideas will improve Outcome: Not Progressing Goal: Will verbalize positive feelings about self Outcome: Not Progressing

## 2019-12-03 NOTE — [facility] Group Notes (Signed)
LCSW Group Therapy 12/03/19 2:15-2:50 PM    Type of Therapy and Topic:  Group Therapy:  Setting Goals   Participation Level:  Active   Description of Group: In this process group, patients discussed using strengths to work toward goals and address challenges.  Patients identified two positive things about themselves and one goal they were working on.  Patients were given the opportunity to share openly and support each other's plan for self-empowerment.  The group discussed the value of gratitude and were encouraged to have a daily reflection of positive characteristics or circumstances.  Patients were encouraged to identify a plan to utilize their strengths to work on current challenges and goals.   Therapeutic Goals 1. Patient will verbalize personal strengths/positive qualities and relate how these can assist with achieving desired personal goals 2. Patients will verbalize affirmation of peers plans for personal change and goal setting 3. Patients will explore the value of gratitude and positive focus as related to successful achievement of goals 4. Patients will verbalize a plan for regular reinforcement of personal positive qualities and circumstances.   Summary of Patient Progress: Patient checked into group feeling tired. Patient identified the definition of goals. Patients was given the opportunity to share openly and support other group members' plan for self-empowerment. Patient verbalized personal strength and how they relate to achieving the desired goal. Patient stated that she going to to sleep the whole time she stays in the hospital. CSW spoke to patient about positive and negative goals. Amit did not speak about a personal change she would make upon discharge.       Therapeutic Modalities Cognitive Behavioral Therapy Motivational Interviewing

## 2019-12-03 NOTE — H&P (Signed)
Psychiatric Admission Assessment Adult  Patient Identification: Susan BrokerHeather L Baranowski MRN:  [id number] Date of Evaluation:  12/03/2019    Chief Complaint:  Worried about my child, mood, IVC and overall issues        MDD (major depressive disorder), recurrent episode, severe (HCC) [F33.2] Principal Diagnosis:   Major Depression moderate recurrent  Cocaine, MJ ETOh abuse and dependence s/p intoxication of all Parent child issues Adjustment Disorder  Post ETOH intoxication      Diagnosis:  Active Problems:   MDD (major depressive disorder), recurrent episode, severe (HCC)  History of Present Illness:    Patient with history of foster home placements, reactive attachment issues, irresponsible behaviors major depression moderate recurrent, generalized anxiety, above substance problems ---ON IVC after sister filed when patient ran into traffic to be run over and threatened to harm Self  DSS gave family custody over to relative and she was upset ---when they found she was using drugs and all --and husband as well.   Now here for admission and recovery      Associated Signs/Symptoms:   Depressed mood, crying spells lack of energy motivation concentration attention focus sleep ---Si with plans   No frank bipolar or psychosis  Has related generalized anxiety   Also has ---intoxication issues with ETOH acutely and this also was a problem  Patient made obscene gestures when police brought her to ER   Past Psychiatric History:  '  Mood isues at age 24 but  Nothing regular or formal much since t hen No regular or serious study of recovery and all.   Is the patient at risk to self? Yes.    Has the patient been a risk to self in the past 6 months? Yes.    Has the patient been a risk to self within the distant past? No.  Is the patient a risk to others? No.  Has the patient been a risk to others in the past 6 months? No.  Has the patient been a risk to others within the  distant past? No.   Prior Inpatient Therapy:   Prior Outpatient Therapy:    Alcohol Screening: 1. How often do you have a drink containing alcohol?: Never 2. How many drinks containing alcohol do you have on a typical day when you are drinking?: 1 or 2 3. How often do you have six or more drinks on one occasion?: Never AUDIT-C Score: 0 4. How often during the last year have you found that you were not able to stop drinking once you had started?: Never 5. How often during the last year have you failed to do what was normally expected from you because of drinking?: Never 6. How often during the last year have you needed a first drink in the morning to get yourself going after a heavy drinking session?: Never 7. How often during the last year have you had a feeling of guilt of remorse after drinking?: Never 8. How often during the last year have you been unable to remember what happened the night before because you had been drinking?: Never 9. Have you or someone else been injured as a result of your drinking?: No 10. Has a relative or friend or a doctor or another health worker been concerned about your drinking or suggested you cut down?: No Alcohol Use Disorder Identification Test Final Score (AUDIT): 0 Alcohol Brief Interventions/Follow-up: Patient Refused Substance Abuse History in the last 12 months:  Yes.   Consequences of Substance Abuse:  Temp custody to another relative for daughter ---school and job interference Disruption of families and households    Previous Psychotropic Medications:   Trazodone and she cannot recall    Psychological Evaluations: No  Past Medical History:  Past Medical History:  Diagnosis Date  . ADHD (attention deficit hyperactivity disorder)   . Depression   . Preeclampsia, severe, third trimester    HELLP Syndrome    Past Surgical History:  Procedure Laterality Date  . NO PAST SURGERIES     Family History:  Family History  Problem Relation  Age of Onset  . SIDS Son    Family Psychiatric  History: pending she cannot recall  Tobacco Screening:   Social History:  Social History   Substance and Sexual Activity  Alcohol Use Not Currently   Comment: vodka     Social History   Substance and Sexual Activity  Drug Use Yes  . Frequency: 1.0 times per week  . Types: Marijuana    Additional Social History: Marital status: Single Are you sexually active?: Yes What is your sexual orientation?: Bi sexual Has your sexual activity been affected by drugs, alcohol, medication, or emotional stress?: None Does patient have children?: Yes How many children?: 1 (Daughter just turned [age]) How is patient's relationship with their children?: Had one visit with daughter who is in the care of her family through DSS                         Allergies:  No Known Allergies Lab Results:  Results for orders placed or performed during the hospital encounter of 12/02/19 (from the past 48 hour(s))  Comprehensive metabolic panel     Status: Abnormal   Collection Time: 12/01/19 11:50 PM  Result Value Ref Range   Sodium 137 135 - 145 mmol/L   Potassium 3.1 (L) 3.5 - 5.1 mmol/L   Chloride 106 98 - 111 mmol/L   CO2 18 (L) 22 - 32 mmol/L   Glucose, Bld 128 (H) 70 - 99 mg/dL    Comment: Glucose reference range applies only to samples taken after fasting for at least 8 hours.   BUN 8 6 - 20 mg/dL   Creatinine, Ser 5.95 0.44 - 1.00 mg/dL   Calcium 8.6 (L) 8.9 - 10.3 mg/dL   Total Protein 7.3 6.5 - 8.1 g/dL   Albumin 4.2 3.5 - 5.0 g/dL   AST 62 (H) 15 - 41 U/L   ALT 125 (H) 0 - 44 U/L   Alkaline Phosphatase 115 38 - 126 U/L   Total Bilirubin 0.7 0.3 - 1.2 mg/dL   GFR calc non Af Amer >60 >60 mL/min   GFR calc Af Amer >60 >60 mL/min   Anion gap 13 5 - 15    Comment: Performed at [facility], [address] [place], [place] [address]  Ethanol     Status: Abnormal   Collection Time: 12/01/19 11:50 PM  Result Value Ref Range    Alcohol, Ethyl (B) 97 (H) <10 mg/dL    Comment: (NOTE) Lowest detectable limit for serum alcohol is 10 mg/dL.  For medical purposes only. Performed at [facility], [address] [place], [place] [address]   Salicylate level     Status: Abnormal   Collection Time: 12/01/19 11:50 PM  Result Value Ref Range   Salicylate Lvl <7.0 (L) 7.0 - 30.0 mg/dL    Comment: Performed at [facility], [address] [place],  [place] [address]  Acetaminophen level     Status: Abnormal   Collection Time: 12/01/19 11:50 PM  Result Value Ref Range   Acetaminophen (Tylenol), Serum <10 (L) 10 - 30 ug/mL    Comment: (NOTE) Therapeutic concentrations vary significantly. A range of 10-30 ug/mL  may be an effective concentration for many patients. However, some  are best treated at concentrations outside of this range. Acetaminophen concentrations >150 ug/mL at 4 hours after ingestion  and >50 ug/mL at 12 hours after ingestion are often associated with  toxic reactions.  Performed at [facility], [address] [place], [place] [address]   cbc     Status: Abnormal   Collection Time: 12/01/19 11:50 PM  Result Value Ref Range   WBC 13.9 (H) 4.0 - 10.5 K/uL   RBC 4.40 3.87 - 5.11 MIL/uL   Hemoglobin 13.9 12.0 - 15.0 g/dL   HCT 82.9 36 - 46 %   MCV 93.0 80.0 - 100.0 fL   MCH 31.6 26.0 - 34.0 pg   MCHC 34.0 30.0 - 36.0 g/dL   RDW 56.2 13.0 - 86.5 %   Platelets 227 150 - 400 K/uL   nRBC 0.0 0.0 - 0.2 %    Comment: Performed at [facility], [address] [place], [place] [address]  Urine Drug Screen, Qualitative     Status: Abnormal   Collection Time: 12/01/19 11:50 PM  Result Value Ref Range   Tricyclic, Ur Screen NONE DETECTED NONE DETECTED   Amphetamines, Ur Screen NONE DETECTED NONE DETECTED   MDMA (Ecstasy)Ur Screen NONE DETECTED NONE DETECTED   Cocaine Metabolite,Ur [place] POSITIVE (A) NONE DETECTED   Opiate, Ur Screen NONE DETECTED NONE DETECTED    Phencyclidine (PCP) Ur S NONE DETECTED NONE DETECTED   Cannabinoid 50 Ng, Ur [place] POSITIVE (A) NONE DETECTED   Barbiturates, Ur Screen NONE DETECTED NONE DETECTED   Benzodiazepine, Ur Scrn POSITIVE (A) NONE DETECTED   Methadone Scn, Ur NONE DETECTED NONE DETECTED    Comment: (NOTE) Tricyclics + metabolites, urine    Cutoff 1000 ng/mL Amphetamines + metabolites, urine  Cutoff 1000 ng/mL MDMA (Ecstasy), urine              Cutoff 500 ng/mL Cocaine Metabolite, urine          Cutoff 300 ng/mL Opiate + metabolites, urine        Cutoff 300 ng/mL Phencyclidine (PCP), urine         Cutoff 25 ng/mL Cannabinoid, urine                 Cutoff 50 ng/mL Barbiturates + metabolites, urine  Cutoff 200 ng/mL Benzodiazepine, urine              Cutoff 200 ng/mL Methadone, urine                   Cutoff 300 ng/mL  The urine drug screen provides only a preliminary, unconfirmed analytical test result and should not be used for non-medical purposes. Clinical consideration and professional judgment should be applied to any positive drug screen result due to possible interfering substances. A more specific alternate chemical method must be used in order to obtain a confirmed analytical result. Gas chromatography / mass spectrometry (GC/MS) is the preferred confirm atory method. Performed at [facility], [address] [place], [place] [address]   TSH     Status: None   Collection Time: 12/01/19 11:50 PM  Result Value Ref Range  TSH 1.988 0.350 - 4.500 uIU/mL    Comment: Performed by a 3rd Generation assay with a functional sensitivity of <=0.01 uIU/mL. Performed at [facility], [address] [place], [place] [address]   SARS Coronavirus 2 by RT PCR (hospital order, performed in [facility] hospital lab) Nasopharyngeal Nasopharyngeal Swab     Status: None   Collection Time: 12/01/19 11:50 PM   Specimen: Nasopharyngeal Swab  Result Value Ref Range   SARS Coronavirus 2 NEGATIVE  NEGATIVE    Comment: (NOTE) SARS-CoV-2 target nucleic acids are NOT DETECTED.  The SARS-CoV-2 RNA is generally detectable in upper and lower respiratory specimens during the acute phase of infection. The lowest concentration of SARS-CoV-2 viral copies this assay can detect is 250 copies / mL. A negative result does not preclude SARS-CoV-2 infection and should not be used as the sole basis for treatment or other patient management decisions.  A negative result may occur with improper specimen collection / handling, submission of specimen other than nasopharyngeal swab, presence of viral mutation(s) within the areas targeted by this assay, and inadequate number of viral copies (<250 copies / mL). A negative result must be combined with clinical observations, patient history, and epidemiological information.  Fact Sheet for Patients:   BoilerBrush.com.cy  Fact Sheet for Healthcare Providers: https://pope.com/  This test is not yet approved or  cleared by the [place] FDA and has been authorized for detection and/or diagnosis of SARS-CoV-2 by FDA under an Emergency Use Authorization (EUA).  This EUA will remain in effect (meaning this test can be used) for the duration of the COVID-19 declaration under Section 564(b)(1) of the Act, 21 U.S.C. section 360bbb-3(b)(1), unless the authorization is terminated or revoked sooner.  Performed at [facility], [address] [place], [place] [address]     Blood Alcohol level:  Lab Results  Component Value Date   ETH 1 (H) 12/01/2019    Metabolic Disorder Labs:  No results found for: HGBA1C, MPG No results found for: PROLACTIN No results found for: CHOL, TRIG, HDL, CHOLHDL, VLDL, LDLCALC  Current Medications: Current Facility-Administered Medications  Medication Dose Route Frequency Provider Last Rate Last Admin  . acetaminophen (TYLENOL) tablet 650 mg  650 mg Oral Q6H  PRN Gillermo Murdoch, NP   650 mg at 12/03/19 0759  . alum & mag hydroxide-simeth (MAALOX/MYLANTA) 200-200-20 MG/5ML suspension 30 mL  30 mL Oral Q4H PRN Gillermo Murdoch, NP      . magnesium hydroxide (MILK OF MAGNESIA) suspension 30 mL  30 mL Oral Daily PRN Gillermo Murdoch, NP      . nicotine (NICODERM CQ - dosed in mg/24 hours) patch 21 mg  21 mg Transdermal Once Gillermo Murdoch, NP   21 mg at 12/02/19 1846  . traZODone (DESYREL) tablet 50 mg  50 mg Oral QHS PRN Gillermo Murdoch, NP   50 mg at 12/02/19 2153   PTA Medications: Medications Prior to Admission  Medication Sig Dispense Refill Last Dose  . LESSINA-28 0.1-20 MG-MCG tablet Take 1 tablet by mouth daily.   Past Month at Unknown time    Musculoskeletal: Strength & Muscle Tone: normal  Gait & Station: norma l Patient leans:   Psychiatric Specialty Exam: Physical Exam  Review of Systems  Blood pressure (!) 138/96, pulse 80, temperature 98 F (36.7 C), temperature source Oral, resp. rate 18, height 5\' 4"  (1.626 m), weight 91.6 kg, SpO2 100 %, unknown if currently breastfeeding.Body mass index is 34.67 kg/m.  Mental status  Appearance Normal  Eye contact and rapport okay Consciousness not clouded or fluctaunt Concentration and attention okay Mood and affect somewhat depressed, anxious and constricted Movement problems none ADL's okay when not intoxicated Sleep okay when not intoxicated Cognition okay when sober Speech --normal rate tone volume fluency Thought process and content --issues with daughter and all depression themes but no frank psychosis or mania Memory remote recent and immediate okay Fund of knowledge --intelligence --average to above average Judgement poor Reliability and insight fair SI and HI --currently contracts, most of her issue was during an intoxication --remains on IVC for now Abstraction normal  Assets ---aunt and brother helpful  Liabilities --addictions, addictive  personality poor judgement influence of addicted husband  ESL ----7 days                                                     Treatment Plan Summary:  Dual diagnosis female on IVC post intoxication where during the episiode voices SI and possible plans   Now here for eval  Mgt and support       Observation Level/Precautions:  q15  Laboratory:  Urine pregnancy added  Psychotherapy:  Daily brief   Medications:  Pending   Consultations:    Discharge Concerns:  Child safety mood sobriety    Estimated LOS:   5-7  Other:     Physician Treatment Plan for Primary    Med mgt, CW mgt groups rec therapy, milieu family meetings, discharge planning and treatment team  Psycho education  Long and short term goals   Sobriety, coping and social skills parenting classes, responsible choices ----Mood mgt with right meds and support --further her education and career     I certify that inpatient services furnished can reasonably be expected to improve the patient's condition.    Roselind Messier, MD 7/24/20212:00 PM

## 2019-12-03 NOTE — Plan of Care (Signed)
Pt rates depression and anxiety both 8/10. Pt denies SI, HI and AVH. Pt was educated on care plan and verbalizes understanding. Torrie Mayers RN Problem: Education: Goal: Utilization of techniques to improve thought processes will improve 12/03/2019 1825 by Chalmers Cater, RN Outcome: Progressing 12/03/2019 1822 by Chalmers Cater, RN Outcome: Progressing Goal: Knowledge of the prescribed therapeutic regimen will improve 12/03/2019 1825 by Chalmers Cater, RN Outcome: Progressing 12/03/2019 1822 by Chalmers Cater, RN Outcome: Progressing   Problem: Activity: Goal: Interest or engagement in leisure activities will improve 12/03/2019 1825 by Chalmers Cater, RN Outcome: Progressing 12/03/2019 1822 by Chalmers Cater, RN Outcome: Progressing Goal: Imbalance in normal sleep/wake cycle will improve 12/03/2019 1825 by Chalmers Cater, RN Outcome: Progressing 12/03/2019 1822 by Chalmers Cater, RN Outcome: Progressing   Problem: Coping: Goal: Coping ability will improve 12/03/2019 1825 by Chalmers Cater, RN Outcome: Progressing 12/03/2019 1822 by Chalmers Cater, RN Outcome: Progressing Goal: Will verbalize feelings 12/03/2019 1825 by Chalmers Cater, RN Outcome: Progressing 12/03/2019 1822 by Chalmers Cater, RN Outcome: Progressing   Problem: Health Behavior/Discharge Planning: Goal: Ability to make decisions will improve 12/03/2019 1825 by Chalmers Cater, RN Outcome: Progressing 12/03/2019 1822 by Chalmers Cater, RN Outcome: Progressing Goal: Compliance with therapeutic regimen will improve 12/03/2019 1825 by Chalmers Cater, RN Outcome: Progressing 12/03/2019 1822 by Chalmers Cater, RN Outcome: Progressing   Problem: Role Relationship: Goal: Will demonstrate positive changes in social behaviors and relationships 12/03/2019 1825 by Chalmers Cater, RN Outcome: Progressing 12/03/2019 1822 by Chalmers Cater, RN Outcome: Progressing   Problem: Safety: Goal: Ability to disclose  and discuss suicidal ideas will improve 12/03/2019 1825 by Chalmers Cater, RN Outcome: Progressing 12/03/2019 1822 by Chalmers Cater, RN Outcome: Progressing Goal: Ability to identify and utilize support systems that promote safety will improve 12/03/2019 1825 by Chalmers Cater, RN Outcome: Progressing 12/03/2019 1822 by Chalmers Cater, RN Outcome: Progressing   Problem: Self-Concept: Goal: Will verbalize positive feelings about self 12/03/2019 1825 by Chalmers Cater, RN Outcome: Progressing 12/03/2019 1822 by Chalmers Cater, RN Outcome: Progressing Goal: Level of anxiety will decrease 12/03/2019 1825 by Chalmers Cater, RN Outcome: Progressing 12/03/2019 1822 by Chalmers Cater, RN Outcome: Progressing   Problem: Education: Goal: Knowledge of [facility] Education information/materials will improve 12/03/2019 1825 by Chalmers Cater, RN Outcome: Progressing 12/03/2019 1822 by Chalmers Cater, RN Outcome: Progressing Goal: Emotional status will improve 12/03/2019 1825 by Chalmers Cater, RN Outcome: Progressing 12/03/2019 1822 by Chalmers Cater, RN Outcome: Progressing Goal: Mental status will improve 12/03/2019 1825 by Chalmers Cater, RN Outcome: Progressing 12/03/2019 1822 by Chalmers Cater, RN Outcome: Progressing Goal: Verbalization of understanding the information provided will improve 12/03/2019 1825 by Chalmers Cater, RN Outcome: Progressing 12/03/2019 1822 by Chalmers Cater, RN Outcome: Progressing   Problem: Activity: Goal: Interest or engagement in activities will improve 12/03/2019 1825 by Chalmers Cater, RN Outcome: Progressing 12/03/2019 1822 by Chalmers Cater, RN Outcome: Progressing Goal: Sleeping patterns will improve 12/03/2019 1825 by Chalmers Cater, RN Outcome: Progressing 12/03/2019 1822 by Chalmers Cater, RN Outcome: Progressing   Problem: Coping: Goal: Ability to verbalize frustrations and anger appropriately will improve 12/03/2019  1825 by Chalmers Cater, RN Outcome: Progressing 12/03/2019 1822 by Chalmers Cater, RN Outcome: Progressing Goal: Ability to demonstrate self-control will improve 12/03/2019 1825 by Chalmers Cater, RN Outcome:  Progressing 12/03/2019 1822 by Chalmers Cater, RN Outcome: Progressing   Problem: Health Behavior/Discharge Planning: Goal: Identification of resources available to assist in meeting health care needs will improve 12/03/2019 1825 by Chalmers Cater, RN Outcome: Progressing 12/03/2019 1822 by Chalmers Cater, RN Outcome: Progressing Goal: Compliance with treatment plan for underlying cause of condition will improve 12/03/2019 1825 by Chalmers Cater, RN Outcome: Progressing 12/03/2019 1822 by Chalmers Cater, RN Outcome: Progressing   Problem: Physical Regulation: Goal: Ability to maintain clinical measurements within normal limits will improve 12/03/2019 1825 by Chalmers Cater, RN Outcome: Progressing 12/03/2019 1822 by Chalmers Cater, RN Outcome: Progressing   Problem: Safety: Goal: Periods of time without injury will increase 12/03/2019 1825 by Chalmers Cater, RN Outcome: Progressing 12/03/2019 1822 by Chalmers Cater, RN Outcome: Progressing   Problem: Education: Goal: Ability to make informed decisions regarding treatment will improve 12/03/2019 1825 by Chalmers Cater, RN Outcome: Progressing 12/03/2019 1822 by Chalmers Cater, RN Outcome: Progressing   Problem: Coping: Goal: Coping ability will improve 12/03/2019 1825 by Chalmers Cater, RN Outcome: Progressing 12/03/2019 1822 by Chalmers Cater, RN Outcome: Progressing   Problem: Health Behavior/Discharge Planning: Goal: Identification of resources available to assist in meeting health care needs will improve 12/03/2019 1825 by Chalmers Cater, RN Outcome: Progressing 12/03/2019 1822 by Chalmers Cater, RN Outcome: Progressing   Problem: Medication: Goal: Compliance with prescribed medication regimen will  improve 12/03/2019 1825 by Chalmers Cater, RN Outcome: Progressing 12/03/2019 1822 by Chalmers Cater, RN Outcome: Progressing   Problem: Self-Concept: Goal: Ability to disclose and discuss suicidal ideas will improve 12/03/2019 1825 by Chalmers Cater, RN Outcome: Progressing 12/03/2019 1822 by Chalmers Cater, RN Outcome: Progressing Goal: Will verbalize positive feelings about self 12/03/2019 1825 by Chalmers Cater, RN Outcome: Progressing 12/03/2019 1822 by Chalmers Cater, RN Outcome: Progressing

## 2019-12-03 NOTE — Progress Notes (Signed)
Patient is quiet and reserved.  She is alert and oriented X4.  She continues to be isolative to her room, but did come out for group and snack. During this encounter she denied SI/HI/AH/VH.  She does endorse anxiety and depression that she reports is due to being hospitalized.  She received her prescribed meds and tolerated without incident. She remains safe with 15 minute safety checks and informed to contact staff with any concerns.     Cleo Butler-Nichoslon, LPN

## 2019-12-03 NOTE — Tx Team (Signed)
Interdisciplinary Treatment and Diagnostic Plan Update  12/03/2019 Time of Session: 12:45 PM Susan [name] MRN: [id number]  Principal Diagnosis: <principal problem not specified>  Secondary Diagnoses: Active Problems:   MDD (major depressive disorder), recurrent episode, severe (HCC)   Current Medications:  Current Facility-Administered Medications  Medication Dose Route Frequency Provider Last Rate Last Admin  . acetaminophen (TYLENOL) tablet 650 mg  650 mg Oral Q6H PRN Caroline Sauger, NP   650 mg at 12/03/19 0759  . alum & mag hydroxide-simeth (MAALOX/MYLANTA) 200-200-20 MG/5ML suspension 30 mL  30 mL Oral Q4H PRN Caroline Sauger, NP      . magnesium hydroxide (MILK OF MAGNESIA) suspension 30 mL  30 mL Oral Daily PRN Caroline Sauger, NP      . nicotine (NICODERM CQ - dosed in mg/24 hours) patch 21 mg  21 mg Transdermal Once Caroline Sauger, NP   21 mg at 12/02/19 1846  . traZODone (DESYREL) tablet 50 mg  50 mg Oral QHS PRN Caroline Sauger, NP   50 mg at 12/02/19 2153   PTA Medications: Medications Prior to Admission  Medication Sig Dispense Refill Last Dose  . LESSINA-28 0.1-20 MG-MCG tablet Take 1 tablet by mouth daily.   Past Month at Unknown time    Patient Stressors:    Patient Strengths:    Treatment Modalities: Medication Management, Group therapy, Case management,  1 to 1 session with clinician, Psychoeducation, Recreational therapy.   Physician Treatment Plan for Primary Diagnosis: <principal problem not specified> Long Term Goal(s):     Short Term Goals:    Medication Management: Evaluate patient's response, side effects, and tolerance of medication regimen.  Therapeutic Interventions: 1 to 1 sessions, Unit Group sessions and Medication administration.  Evaluation of Outcomes: Not Met  Physician Treatment Plan for Secondary Diagnosis: Active Problems:   MDD (major depressive disorder), recurrent episode, severe ([place])  Long Term  Goal(s):     Short Term Goals:       Medication Management: Evaluate patient's response, side effects, and tolerance of medication regimen.  Therapeutic Interventions: 1 to 1 sessions, Unit Group sessions and Medication administration.  Evaluation of Outcomes: Not Met   RN Treatment Plan for Primary Diagnosis: <principal problem not specified> Long Term Goal(s): Knowledge of disease and therapeutic regimen to maintain health will improve  Short Term Goals: Ability to remain free from injury will improve, Ability to verbalize frustration and anger appropriately will improve, Ability to demonstrate self-control, Ability to participate in decision making will improve, Ability to verbalize feelings will improve, Ability to disclose and discuss suicidal ideas, Ability to identify and develop effective coping behaviors will improve and Compliance with prescribed medications will improve  Medication Management: RN will administer medications as ordered by provider, will assess and evaluate patient's response and provide education to patient for prescribed medication. RN will report any adverse and/or side effects to prescribing provider.  Therapeutic Interventions: 1 on 1 counseling sessions, Psychoeducation, Medication administration, Evaluate responses to treatment, Monitor vital signs and CBGs as ordered, Perform/monitor CIWA, COWS, AIMS and Fall Risk screenings as ordered, Perform wound care treatments as ordered.  Evaluation of Outcomes: Not Met   LCSW Treatment Plan for Primary Diagnosis: <principal problem not specified> Long Term Goal(s): Safe transition to appropriate next level of care at discharge, Engage patient in therapeutic group addressing interpersonal concerns.  Short Term Goals: Engage patient in aftercare planning with referrals and resources, Increase ability to appropriately verbalize feelings, Increase emotional regulation, Facilitate acceptance of mental health diagnosis  and concerns, Facilitate patient progression through stages of change regarding substance use diagnoses and concerns, Identify triggers associated with mental health/substance abuse issues and Increase skills for wellness and recovery  Therapeutic Interventions: Assess for all discharge needs, 1 to 1 time with Social worker, Explore available resources and support systems, Assess for adequacy in community support network, Educate family and significant other(s) on suicide prevention, Complete Psychosocial Assessment, Interpersonal group therapy.  Evaluation of Outcomes: Not Met   Progress in Treatment: Attending groups: Yes. Participating in groups: Yes. Taking medication as prescribed: Yes. Toleration medication: Yes. Family/Significant other contact made: No, will contact:  Contact will be made with patients aunt  Patient understands diagnosis: Yes. Discussing patient identified problems/goals with staff: Yes. Medical problems stabilized or resolved: No. Denies suicidal/homicidal ideation: Yes. Issues/concerns per patient self-inventory: No. Other:   New problem(s) identified: No, Describe:  None  New Short Term/Long Term Goal(s): medication stabilization, elimination of SI thoughts, development of comprehensive mental wellness plan  Patient Goals: Patient would like to go to outpatient therapy and medication management. Patient would also like to work towards getting her daughter back in her care. Patient spoke about going back on her ADHD medication.   Discharge Plan or Barriers: Patient plans on returning to her brothers home and staying away from drugs/alcohol. Patient would like to seek outpatient therapy.   Reason for Continuation of Hospitalization: Depression Suicidal ideation  Estimated Length of Stay: 5-7 Days  Attendees: Patient: 12/03/2019 3:58 PM  Physician: Dr. Hedwig Morton, MD 12/03/2019 3:58 PM  Nursing: Collier Bullock, RN  12/03/2019 3:58 PM  RN Care Manager: 12/03/2019 3:58  PM  Social Worker: Raina Mina, [place] 12/03/2019 3:58 PM  Recreational Therapist:  12/03/2019 3:58 PM  Other:  12/03/2019 3:58 PM  Other:  12/03/2019 3:58 PM  Other: 12/03/2019 3:58 PM    Scribe for Treatment Team: Raina Mina, [place] 12/03/2019 3:58 PM
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
## 2019-12-03 NOTE — Progress Notes (Signed)
D- Patient alert and oriented. Affect/mood is calm and cooperative. Pt denies SI, HI, AVH. Pt has received PRN for pain.   A- Scheduled medications administered to patient, per MD orders. Support and encouragement provided.  Routine safety checks conducted every 15 minutes.  Patient informed to notify staff with problems or concerns.   R- No adverse drug reactions noted. Patient contracts for safety at this time. Patient compliant with medications and treatment plan. Patient receptive, calm, and cooperative. Patient interacts well with others on the unit.  Patient remains safe at this time.  Pt has attended groups and been social.   Torrie Mayers RN

## 2019-12-04 MED ORDER — NICOTINE 21 MG/24HR TD PT24
21.0000 mg | MEDICATED_PATCH | Freq: Every day | TRANSDERMAL | Status: DC
  Administered 2019-12-04 – 2019-12-05 (×2): 21 mg via TRANSDERMAL
  Filled 2019-12-04 (×2): qty 1

## 2019-12-04 NOTE — B[facility] Group Notes (Signed)
[facility] Group Notes: (Clinical Social Work)   12/04/2019      Type of Therapy:  Group Therapy   Participation Level:  Did Not Attend despite MHT prompting   Shellia Cleverly, LCSW  12/04/2019 1:55 PM
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
## 2019-12-04 NOTE — Progress Notes (Signed)
Pt is alert and oriented to person, place, time and situation. Pt is calm, cooperative, denies suicidal and homicidal ideation, denies feelings of depression and anxiety. Pt is easily irritable, reports it is because she wishes she had a nicotine patch. MD due to arrive to unit and will bring this request to doctor's attention. Pt's appetite is good, pt reports she slept well, affect if flat. No distress noted, none reported. Will continue to monitor pt per Q15 face checks and monitor for safety and progress.

## 2019-12-04 NOTE — Progress Notes (Signed)
Patient ID: Susan [name], female   DOB: [birth year], 24 y.o.   MRN: [id number]    Rama Candise Bowens MD Brief Progress note Sunday  Patient remains on IVC She is edgy at times  She feels she can go home to brother tomorrow No other active withdrawal or other suicidal signs or issues  She feels the rest can be done as an outpatient   Brief MS  Alert cooperative somewhat Oriented times four Not clouded or fluctuant Mood somewhat edgy Affect about the same No active SI HI or plans Concentration and attention okay Contracts for safety   No other new labs  Urine pregnancy ---negative  She most likely can be discharged tomorrow if she signs for safety

## 2019-12-04 NOTE — Progress Notes (Signed)
Patient is quiet and reserved, but presented as highly anxious as she awaited the results of her pregnancy test. Once patient was made aware of the negative result she was much calmer.  She received her presribed meds and tolerated without incident. She denied SI/HI/AVH on this encounter. She endorsed depression and anxiety that she reported was mostly due to her current life stressors. She complains of constipation on this encounter and received medication and education r/t need for increase in water consumption and effects of opioids on the body. She remains safe on the unit with 15 minute safety checks and informed to contact staff with any concerns.    Susan Butler-Nicholson, LPN

## 2019-12-05 DIAGNOSIS — F14120 Cocaine abuse with intoxication, uncomplicated: Secondary | ICD-10-CM

## 2019-12-05 DIAGNOSIS — F10129 Alcohol abuse with intoxication, unspecified: Secondary | ICD-10-CM

## 2019-12-05 NOTE — Progress Notes (Signed)
  [facility] Adult Case Management Discharge Plan :  Will you be returning to the same living situation after discharge:  Yes,  pt says she will go to her father's home At discharge, do you have transportation home?: Yes,  pt reports aunt will pick up Do you have the ability to pay for your medications: Yes,  Medicaid  Release of information consent forms completed and in the chart;  Patient's signature needed at discharge.  Patient to Follow up at:  Follow-up Information    [facility] Follow up on 12/12/2019.   Why: You are scheduled to meet with Unk Pinto, peer support specialist on 12/12/2019 at 7am via zoom. Thank you. Contact information: [address] [place] [address] [phone]               Next level of care provider has access to [facility] Link:no  Safety Planning and Suicide Prevention discussed: Yes,  [name], aunt     Has patient been referred to the Quitline?: Patient refused referral  Patient has been referred for addiction treatment: N/A  Suzan Slick, LCSW 12/05/2019, 11:00 AM

## 2019-12-05 NOTE — Progress Notes (Signed)
Patient denies SI/HI, denies A/V hallucinations. Patient verbalizes understanding of discharge instructions, follow up care and prescriptions. Patient given all belongings from [facility] locker. Patient escorted out by staff, transported by family. 

## 2019-12-05 NOTE — Progress Notes (Signed)
Recreation Therapy Notes  Date: 12/05/2019  Time: 9:30 am  Location: Craft room   Behavioral response: Appropriate  Intervention Topic: Relaxation    Discussion/Intervention:  Group content today was focused on relaxation. The group defined relaxation and identified healthy ways to relax. Individuals expressed how much time they spend relaxing. Patients expressed how much their life would be if they did not make time for themselves to relax. The group stated ways they could improve their relaxation techniques in the future.  Individuals participated in the intervention "Time to Relax" where they had a chance to experience different relaxation techniques.  Clinical Observations/Feedback:  Patient came to group and was focused on what peers and staff had to say about relaxation. She defined relaxation as chilling. Particpant explained that she watches television to relax. Individual was social with peers and staff while participating in the intervention.  Demetrios Byron LRT/CTRS         Madylyn Insco 12/05/2019 11:35 AM

## 2019-12-05 NOTE — Progress Notes (Signed)
Patient alert and oriented x 4, affect is flat but brightens upon approach no distress noted interacting appropriately with peers and staff, she appears less anxious she was visible in the milieu, attended evening wrap up group and complaint with evening medication regimen. Patient currently denies SI/HI/AVH, 15 minutes safety checks maintained will continue to monitor.

## 2019-12-05 NOTE — BHH Suicide Risk Assessment (Signed)
[facility] Discharge Suicide Risk Assessment   Principal Problem: <principal problem not specified> Discharge Diagnoses: Active Problems:   MDD (major depressive disorder), recurrent episode, severe (HCC)   Alcohol abuse with intoxication (HCC)   Cocaine abuse with intoxication and without complication (HCC)   Total Time spent with patient: greater than 30 min   Including discharge  Musculoskeletal: Strength & Muscle Tone: normal  Gait & Station: normal  Patient leans:   Psychiatric Specialty Exam: Review of Systems  Blood pressure (!) 146/96, pulse 85, temperature 98.2 F (36.8 C), temperature source Oral, resp. rate 18, height 5\' 4"  (1.626 m), weight 91.6 kg, SpO2 100 %, unknown if currently breastfeeding.Body mass index is 34.67 kg/m.    Mental Status already written in discharge  Contracts for safety no active SI HI or plans at discharge                                                    Mental Status Per Nursing Assessment::   On Admission:  NA  Demographic Factors:   Single mom, addictions, no education no job --confused   Loss Factors:  various relatives with illnesses   Historical Factors: Needs guidance for vocational and life directions  Risk Reduction Factors:    Structure, sobriety / better support at home   Continued Clinical Symptoms:  Addictions, impulsivity, anxiety   Cognitive Features That Contribute To Risk:   Impulsive, needs bigger picture thinking     Suicide Risk:   low at discharge    Follow-up Information    [facility] Follow up on 12/12/2019.   Why: You are scheduled to meet with 02/11/2020, peer support specialist on 12/12/2019 at 7am via zoom. Thank you. Contact information: [address] [place] [phone]               Plan Of Care/Follow-up recommendations:    [facility]  Day treatment AA NA --family parenting classes,  1.2 half way houses to live and study with child    Community mental health   [phone], MD 12/05/2019, 11:18 AM

## 2019-12-05 NOTE — Discharge Summary (Signed)
Physician Discharge Summary Note  Patient:  Susan [name] is an 24 y.o., female MRN:  [id number] DOB:  [birth year] Patient phone:  [phone] (home)  Patient address:   [facility] [place] [address],  Total Time spent with patient: 30 plus minutes  Date of Admission:  12/02/2019 Date of Discharge:  12/05/19  Reason for Admission:   Intoxication and SI intent / anxiety parent child problems   Principal Problem: <principal problem not specified> Discharge Diagnoses: Active Problems:   MDD (major depressive disorder), recurrent episode, severe (HCC)   Alcohol abuse with intoxication (HCC)   Cocaine abuse with intoxication and without complication [facility])   Past Psychiatric History:  None   Past Medical History:  Past Medical History:  Diagnosis Date  . ADHD (attention deficit hyperactivity disorder)   . Depression   . Preeclampsia, severe, third trimester    HELLP Syndrome    Past Surgical History:  Procedure Laterality Date  . NO PAST SURGERIES     Family History:  Family History  Problem Relation Age of Onset  . SIDS Son    Family Psychiatric  History:  Social History:  Social History   Substance and Sexual Activity  Alcohol Use Not Currently   Comment: vodka     Social History   Substance and Sexual Activity  Drug Use Yes  . Frequency: 1.0 times per week  . Types: Marijuana    Social History   Socioeconomic History  . Marital status: Single    Spouse name: Not on file  . Number of children: 0  . Years of education: Not on file  . Highest education level: Not on file  Occupational History  . Not on file  Tobacco Use  . Smoking status: Current Every Day Smoker    Packs/day: 0.50    Types: Cigarettes  . Smokeless tobacco: Never Used  Vaping Use  . Vaping Use: Never used  Substance and Sexual Activity  . Alcohol use: Not Currently    Comment: vodka  . Drug use: Yes    Frequency: 1.0 times per week    Types: Marijuana  . Sexual activity: Yes   Other Topics Concern  . Not on file  Social History Narrative  . Not on file   Social Determinants of Health   Financial Resource Strain:   . Difficulty of Paying Living Expenses:   Food Insecurity:   . Worried About Programme researcher, broadcasting/film/video in the Last Year:   . Barista in the Last Year:   Transportation Needs:   . Freight forwarder (Medical):   Marland Kitchen Lack of Transportation (Non-Medical):   Physical Activity:   . Days of Exercise per Week:   . Minutes of Exercise per Session:   Stress:   . Feeling of Stress :   Social Connections:   . Frequency of Communication with Friends and Family:   . Frequency of Social Gatherings with Friends and Family:   . Attends Religious Services:   . Active Member of Clubs or Organizations:   . Attends Banker Meetings:   Marland Kitchen Marital Status:     Hospital Course:   Patient was admitted on an IVC when under intoxication had wanted to run into the street or harm herself after learning about DSS and separation from her daughter.  She has had no job or direction and has been on and off using opioids, cocaine ETOH and MJ   She came in and contracted for safety  Fair participation --she was discharged after safety plan and observation where she will pursue her issues as an outpatient   Went over needs ---in dealing with parenting, sobriety, housing and overall job and education  She felt safe and went home in safet condition to her Aunt   Physical Findings: AIMS:  , ,  ,  ,    CIWA:    COWS:     Musculoskeletal: Strength & Muscle Tone: none  Gait & Station: none  Patient leans: none   Psychiatric Specialty Exam: Physical Exam  Review of Systems  Blood pressure (!) 146/96, pulse 85, temperature 98.2 F (36.8 C), temperature source Oral, resp. rate 18, height 5\' 4"  (1.626 m), weight 91.6 kg, SpO2 100 %, unknown if currently breastfeeding.Body mass index is 34.67 kg/m.  Sleep, cognition, ADL's all normal Appetite normal    Rapport okay Eye contact okay Not clouded or fluctuant Speech normal Affect and mood normal  No other movement problems No active SI HI or plans Fund of knowledge, intelligence, below average Judgement insight reliability fair No frank psychosis or mania for thought process and content Memory remote and recent okay Abstraction normal  Concentration attention, --normal  Consciousness not clouded or fluctuant Appearance normal                                                                  Has this patient used any form of tobacco in the last 30 days? (Cigarettes, Smokeless Tobacco, Cigars, and/or Pipes) none  Blood Alcohol level:  Lab Results  Component Value Date   ETH 97 (H) 12/01/2019    Metabolic Disorder Labs:  No results found for: HGBA1C, MPG No results found for: PROLACTIN No results found for: CHOL, TRIG, HDL, CHOLHDL, VLDL, LDLCALC  See Psychiatric Specialty Exam and Suicide Risk Assessment completed by Attending Physician prior to discharge.  Discharge destination:  Home to aunt   Is patient on multiple antipsychotic therapies at discharge:  no   Has Patient had three or more failed trials of antipsychotic monotherapy by history:  No   Recommended Plan for Multiple Antipsychotic Therapies:  no    Allergies as of 12/05/2019   No Known Allergies     Medication List    TAKE these medications     Indication  LESSINA-28 0.1-20 MG-MCG tablet Generic drug: levonorgestrel-ethinyl estradiol Take 1 tablet by mouth daily.  Indication: Birth Control Treatment       Follow-up Information    [facility] Follow up on 12/12/2019.   Why: You are scheduled to meet with 02/11/2020, peer support specialist on 12/12/2019 at 7am via zoom. Thank you. Contact information: [address] [place] [phone]               Follow-up recommendations:    [facility] day treatment, women's half way  house,  Rehab --educational GED --new job vocational   Comments:   No active SI HI at discharge   Signed: [phone], MD 12/05/2019, 11:29 AM

## 2020-03-14 ENCOUNTER — Other Ambulatory Visit: Payer: Medicaid Other

## 2020-03-14 DIAGNOSIS — Z20822 Contact with and (suspected) exposure to covid-19: Secondary | ICD-10-CM

## 2020-03-16 LAB — NOVEL CORONAVIRUS, NAA: SARS-CoV-2, NAA: NOT DETECTED

## 2020-03-16 LAB — SARS-COV-2, NAA 2 DAY TAT

## 2020-04-25 ENCOUNTER — Other Ambulatory Visit: Payer: Self-pay

## 2020-04-25 ENCOUNTER — Encounter: Payer: Self-pay | Admitting: Obstetrics and Gynecology

## 2020-04-25 ENCOUNTER — Ambulatory Visit (INDEPENDENT_AMBULATORY_CARE_PROVIDER_SITE_OTHER): Payer: Medicaid Other | Admitting: Obstetrics and Gynecology

## 2020-04-25 ENCOUNTER — Other Ambulatory Visit (HOSPITAL_COMMUNITY)
Admission: RE | Admit: 2020-04-25 | Discharge: 2020-04-25 | Disposition: A | Discharge status: Home / Self Care | Payer: Medicaid Other | Source: Ambulatory Visit | Attending: Obstetrics and Gynecology | Admitting: Obstetrics and Gynecology

## 2020-04-25 VITALS — BP 126/82 | HR 114 | Wt 204.0 lb

## 2020-04-25 DIAGNOSIS — Z8759 Personal history of other complications of pregnancy, childbirth and the puerperium: Secondary | ICD-10-CM

## 2020-04-25 DIAGNOSIS — R350 Frequency of micturition: Secondary | ICD-10-CM

## 2020-04-25 DIAGNOSIS — Z113 Encounter for screening for infections with a predominantly sexual mode of transmission: Secondary | ICD-10-CM | POA: Insufficient documentation

## 2020-04-25 DIAGNOSIS — Z369 Encounter for antenatal screening, unspecified: Secondary | ICD-10-CM

## 2020-04-25 DIAGNOSIS — E669 Obesity, unspecified: Secondary | ICD-10-CM

## 2020-04-25 DIAGNOSIS — F1210 Cannabis abuse, uncomplicated: Secondary | ICD-10-CM

## 2020-04-25 DIAGNOSIS — O99212 Obesity complicating pregnancy, second trimester: Secondary | ICD-10-CM | POA: Insufficient documentation

## 2020-04-25 DIAGNOSIS — Z7185 Encounter for immunization safety counseling: Secondary | ICD-10-CM

## 2020-04-25 DIAGNOSIS — Z3A08 8 weeks gestation of pregnancy: Secondary | ICD-10-CM

## 2020-04-25 DIAGNOSIS — O0991 Supervision of high risk pregnancy, unspecified, first trimester: Secondary | ICD-10-CM | POA: Diagnosis not present

## 2020-04-25 DIAGNOSIS — Z124 Encounter for screening for malignant neoplasm of cervix: Secondary | ICD-10-CM

## 2020-04-25 DIAGNOSIS — Z8482 Family history of sudden infant death syndrome: Secondary | ICD-10-CM

## 2020-04-25 DIAGNOSIS — Z720 Tobacco use: Secondary | ICD-10-CM

## 2020-04-25 DIAGNOSIS — O99331 Smoking (tobacco) complicating pregnancy, first trimester: Secondary | ICD-10-CM

## 2020-04-25 DIAGNOSIS — O99211 Obesity complicating pregnancy, first trimester: Secondary | ICD-10-CM

## 2020-04-25 LAB — POCT URINALYSIS DIPSTICK
Bilirubin, UA: NEGATIVE
Blood, UA: NEGATIVE
Glucose, UA: NEGATIVE
Ketones, UA: NEGATIVE
Nitrite, UA: NEGATIVE
Protein, UA: NEGATIVE
Spec Grav, UA: 1.01 (ref 1.010–1.025)
Urobilinogen, UA: 0.2 E.U./dL
pH, UA: 5 (ref 5.0–8.0)

## 2020-04-25 LAB — POCT URINALYSIS DIPSTICK OB
Glucose, UA: NEGATIVE
POC,PROTEIN,UA: NEGATIVE

## 2020-04-25 LAB — POCT URINE PREGNANCY: Preg Test, Ur: POSITIVE — AB

## 2020-04-25 MED ORDER — CONCEPT DHA 53.5-38-1 MG PO CAPS: 1.0000 | ORAL_CAPSULE | Freq: Every day | ORAL | 3 refills | Status: AC

## 2020-04-25 NOTE — Progress Notes (Signed)
New Obstetric Patient H&P    Chief Complaint: Positive urinary pregnancy test and missed period   History of Present Illness: Patient is a 24 y.o. [id number] Not Hispanic or Latino female, presents with amenorrhea and positive home pregnancy test. Patient's last menstrual period was 02/28/2020. and based on her  LMP, her EDD is Estimated Date of Delivery: 12/04/20 and her EGA is [redacted]w[redacted]d. Cycles are 4-5. days, regular, and occur approximately every : 28 days. Patient is unsure if she has had a pap before.   She had a urine pregnancy test which was positive 3 or 4 week(s)  ago. Her last menstrual period was normal and lasted for  4 or 5 day(s). Since her LMP she claims she has experienced an increase in breast tenderness and tiredness. She denies vaginal bleeding. Her past medical history is contibutory. Her prior pregnancies are notable for pre-eclampsia, HELLP Syndrome, tobacco use, mental illness. Patient reports first infant passed due to SIDS at 80 weeks of age.   Since her LMP, she admits to the use of tobacco products  Yes - has cut back from 2 ppd to 1/2 ppd She claims she has gained   4 pounds since the start of her pregnancy.  There are cats in the home in the home  yes If yes indoor - patient does not care for the cats - belong to friend who she lives with She admits close contact with children on a regular basis  yes  She has had chicken pox in the past no She has had Tuberculosis exposures, symptoms, or previously tested positive for TB   no Current or past history of domestic violence. no  Genetic Screening/Teratology Counseling: (Includes patient, baby's father, or anyone in either family with:)   1. Patient's age >/= 9 [facility]  no 2. Thalassemia ([place], [place], Mediterranean, or Asian background): MCV<80  no 3. Neural tube defect (meningomyelocele, spina bifida, anencephaly)  no 4. Congenital heart defect  no  5. Down syndrome  no 6. Tay-Sachs (Jewish, [place]  no 7.  Canavan's Disease  no 8. Sickle cell disease or trait (African)  no  9. Hemophilia or other blood disorders  no  10. Muscular dystrophy  no  11. Cystic fibrosis  no  12. Huntington's Chorea  no  13. Mental retardation/autism  no 14. Other inherited genetic or chromosomal disorder  no 15. Maternal metabolic disorder (DM, PKU, etc)  no 16. Patient or FOB with a child with a birth defect not listed above no  16a. Patient or FOB with a birth defect themselves no 17. Recurrent pregnancy loss, or stillbirth  no  18. Any medications since LMP other than prenatal vitamins (include vitamins, supplements, OTC meds, drugs, alcohol) - ETOH in early pregnancy, quit when she found out she was pregnant 19. Any other genetic/environmental exposure to discuss  no  Infection History:   1. Lives with someone with TB or TB exposed  no  2. Patient or partner has history of genital herpes  no 3. Rash or viral illness since LMP  no 4. History of STI (GC, CT, HPV, syphilis, HIV)  no 5. History of recent travel :  no  Other pertinent information:  Patient currently employed at [employer], lives with a friend but working to get her own place. Desires custody of her daughter.     Review of Systems:10 point review of systems negative unless otherwise noted in HPI  Past Medical History:  Patient Active Problem  List   Diagnosis Date Noted  . Obesity affecting pregnancy in first trimester 04/25/2020    BMI >=35.0-39.9 [ ]  early 1h gtt - ordered [ ]  u/s for dating - ordered [x]  nutritional goals - reviewed at NO[facility] [x]  folic acid 1mg  - ordered with PNV [ ]  bASA (>12 weeks) [ ]  consider nutrition consult [ ]  consider maternal EKG 1st trimester [ ]  Growth u/s 28 [ ] , 32 [ ] , 36 weeks [ ]  [ ]  NST/AFI weekly 37+ weeks (37[] , 38[] , 39[] , 40[] ) [ ]  IOL by 41 weeks (scheduled, prn [] )    . MDD (major depressive disorder), recurrent episode, severe (HCC) 12/02/2019  . Tobacco use complicating pregnancy, first  trimester 08/21/2017    Currently 1/2 ppd.   . Family history of SIDS (sudden infant death syndrome) 2017/05/12    First baby died of SIDS   . Supervision of high risk pregnancy, antepartum 04/06/2017    Clinic [facility] Prenatal Labs  Dating  LMP - dating ordered Blood type:     Genetic Screen  declines Antibody:   Anatomic  Rubella:   Varicella: @VZVIGG @  GTT Early:  Ordered             Third trimester:  RPR:     Rhogam  n/a HBsAg:     TDaP vaccine                       Flu Shot: declines HIV:     Baby Food  formula                  GBS:   Contraception  Pap:  CBB     CS/VBAC    Support Person  n/a - unsure of paternity        . History of pre-eclampsia 04/06/2017  . Marijuana abuse 12/07/2016  . Abnormal liver function tests 09/18/2016    LFTS up and down since first delivery with severe preeclampsia November 2018 ALT 91 and AST 58 December 2018 ALT 42 (up) and AST 24(N) Repeat LFTs at 28 weeks   . Chronic headaches 09/18/2016    Past Surgical History:  Past Surgical History:  Procedure Laterality Date  . NO PAST SURGERIES      Gynecologic History: Patient's last menstrual period was 02/28/2020.  Obstetric History:  Family History:  Family History  Problem Relation Age of Onset  . SIDS Son     Social History:  Social History   Socioeconomic History  . Marital status: Single    Spouse name: Not on file  . Number of children: 0  . Years of education: Not on file  . Highest education level: Not on file  Occupational History  . Not on file  Tobacco Use  . Smoking status: Current Every Day Smoker    Packs/day: 0.50    Types: Cigarettes  . Smokeless tobacco: Never Used  Vaping Use  . Vaping Use: Never used  Substance and Sexual Activity  . Alcohol use: Not Currently    Comment: vodka  . Drug use: Yes    Frequency: 1.0 times per week    Types: Marijuana  . Sexual activity: Yes  Other Topics Concern  . Not on file  Social History  Narrative  . Not on file   Social Determinants of Health   Financial Resource Strain: Not on file  Food Insecurity: Not on file  Transportation Needs: Not on file  Physical Activity: Not on  file  Stress: Not on file  Social Connections: Not on file  Intimate Partner Violence: Not on file    Allergies:  No Known Allergies  Medications: Prior to Admission medications   Medication Sig Start Date End Date Taking? Authorizing Provider  LESSINA-28 0.1-20 MG-MCG tablet Take 1 tablet by mouth daily. Patient not taking: Reported on 04/25/2020 08/25/19   [provider]  Prenat-FeFum-FePo-FA-Omega 3 (CONCEPT DHA) 53.5-38-1 MG CAPS Take 1 capsule by mouth daily. 04/25/20 05/25/20  Zipporah Plants, CNM    Physical Exam Vitals: Blood pressure 126/82, pulse (!) 114, weight 204 lb (92.5 kg), last menstrual period 02/28/2020, unknown if currently breastfeeding.  General: NAD HEENT: normocephalic, anicteric Thyroid: no enlargement, no palpable nodules Pulmonary: No increased work of breathing, CTAB Cardiovascular: RRR, distal pulses 2+ Abdomen: NABS, soft, non-tender, non-distended.  Umbilicus without lesions.  No hepatomegaly, splenomegaly or masses palpable. No evidence of hernia  Genitourinary:  External: Normal external female genitalia.  Normal urethral meatus, normal  Bartholin's and Skene's glands.    Vagina: Normal vaginal mucosa, no evidence of prolapse.    Cervix: Grossly normal in appearance, no bleeding  Uterus: Size consistent with 6-[redacted] week gestation, mobile, normal contour.  No CMT  Adnexa: ovaries non-enlarged, no adnexal masses  Rectal: deferred Extremities: no edema, erythema, or tenderness Neurologic: Grossly intact Psychiatric: mood appropriate, affect full   Assessment: 24 y.o. G3P2002 at [redacted]w[redacted]d presenting to initiate prenatal care  Plan: 1) Avoid alcoholic beverages. 2) Patient encouraged not to smoke.  3) Discontinue the use of all non-medicinal drugs and  chemicals.  4) Take prenatal vitamins daily.  5) Nutrition, food safety (fish, cheese advisories, and high nitrite foods) and exercise discussed. 6) Hospital and practice style discussed with cross coverage system.  7) Genetic Screening, such as with 1st Trimester Screening, cell free fetal DNA, AFP testing, and Ultrasound, as well as with amniocentesis and CVS as appropriate, is discussed with patient. At the conclusion of today's visit patient declined genetic testing 8) Obesity - dating [place] ordered, 1h GTT at next visit - PNV with 1 mg FA 9) Hx of preE - baseline PIH labs today 10) Hx of drug use - UDS today  Zipporah Plants, CNM, MSN [facility], [facility] 04/25/2020, 12:47 PM

## 2020-04-26 LAB — CERVICOVAGINAL ANCILLARY ONLY
Bacterial Vaginitis (gardnerella): POSITIVE — AB
Candida Glabrata: NEGATIVE
Candida Vaginitis: NEGATIVE
Chlamydia: NEGATIVE
Comment: NEGATIVE
Comment: NEGATIVE
Comment: NEGATIVE
Comment: NEGATIVE
Comment: NEGATIVE
Comment: NORMAL
Neisseria Gonorrhea: NEGATIVE
Trichomonas: NEGATIVE

## 2020-04-26 LAB — COMPREHENSIVE METABOLIC PANEL
ALT: 29 IU/L (ref 0–32)
AST: 17 IU/L (ref 0–40)
Albumin/Globulin Ratio: 1.5 (ref 1.2–2.2)
Albumin: 3.9 g/dL (ref 3.9–5.0)
Alkaline Phosphatase: 126 IU/L — ABNORMAL HIGH (ref 44–121)
BUN/Creatinine Ratio: 16 (ref 9–23)
BUN: 8 mg/dL (ref 6–20)
Bilirubin Total: 0.4 mg/dL (ref 0.0–1.2)
CO2: 16 mmol/L — ABNORMAL LOW (ref 20–29)
Calcium: 9.4 mg/dL (ref 8.7–10.2)
Chloride: 101 mmol/L (ref 96–106)
Creatinine, Ser: 0.49 mg/dL — ABNORMAL LOW (ref 0.57–1.00)
GFR calc Af Amer: 160 mL/min/{1.73_m2} (ref >59)
GFR calc non Af Amer: 139 mL/min/{1.73_m2} (ref >59)
Globulin, Total: 2.6 g/dL (ref 1.5–4.5)
Glucose: 80 mg/dL (ref 65–99)
Potassium: 4.4 mmol/L (ref 3.5–5.2)
Sodium: 135 mmol/L (ref 134–144)
Total Protein: 6.5 g/dL (ref 6.0–8.5)

## 2020-04-26 LAB — RPR+RH+ABO+RUB AB+AB SCR+CB...
Antibody Screen: NEGATIVE
HIV Screen 4th Generation wRfx: NONREACTIVE
Hematocrit: 39.1 % (ref 34.0–46.6)
Hemoglobin: 13.4 g/dL (ref 11.1–15.9)
Hepatitis B Surface Ag: NEGATIVE
MCH: 32.4 pg (ref 26.6–33.0)
MCHC: 34.3 g/dL (ref 31.5–35.7)
MCV: 94 fL (ref 79–97)
Platelets: 270 10*3/uL (ref 150–450)
RBC: 4.14 x10E6/uL (ref 3.77–5.28)
RDW: 12.3 % (ref 11.7–15.4)
RPR Ser Ql: NONREACTIVE
Rh Factor: POSITIVE
Rubella Antibodies, IGG: 1.5 index (ref >0.99)
Varicella zoster IgG: <135 index — ABNORMAL LOW (ref >165)
WBC: 10.4 10*3/uL (ref 3.4–10.8)

## 2020-04-26 LAB — PROTEIN / CREATININE RATIO, URINE
Creatinine, Urine: 132.9 mg/dL
Protein, Ur: 9.6 mg/dL
Protein/Creat Ratio: 72 mg/g creat (ref 0–200)

## 2020-04-27 LAB — URINE CULTURE

## 2020-05-03 LAB — URINE DRUG PANEL 7

## 2020-05-08 ENCOUNTER — Encounter: Payer: Self-pay | Admitting: Obstetrics and Gynecology

## 2020-05-08 ENCOUNTER — Other Ambulatory Visit: Payer: Self-pay

## 2020-05-08 ENCOUNTER — Other Ambulatory Visit: Payer: Medicaid Other

## 2020-05-08 ENCOUNTER — Ambulatory Visit (INDEPENDENT_AMBULATORY_CARE_PROVIDER_SITE_OTHER): Payer: Medicaid Other

## 2020-05-08 ENCOUNTER — Ambulatory Visit (INDEPENDENT_AMBULATORY_CARE_PROVIDER_SITE_OTHER): Payer: Medicaid Other | Admitting: Obstetrics and Gynecology

## 2020-05-08 ENCOUNTER — Other Ambulatory Visit: Payer: Self-pay | Admitting: Obstetrics and Gynecology

## 2020-05-08 ENCOUNTER — Encounter: Payer: Medicaid Other | Admitting: Obstetrics

## 2020-05-08 VITALS — BP 122/80 | Wt 205.0 lb

## 2020-05-08 DIAGNOSIS — O99211 Obesity complicating pregnancy, first trimester: Secondary | ICD-10-CM

## 2020-05-08 DIAGNOSIS — O0990 Supervision of high risk pregnancy, unspecified, unspecified trimester: Secondary | ICD-10-CM

## 2020-05-08 DIAGNOSIS — O0991 Supervision of high risk pregnancy, unspecified, first trimester: Secondary | ICD-10-CM

## 2020-05-08 DIAGNOSIS — N760 Acute vaginitis: Secondary | ICD-10-CM

## 2020-05-08 DIAGNOSIS — Z3A10 10 weeks gestation of pregnancy: Secondary | ICD-10-CM

## 2020-05-08 DIAGNOSIS — B9689 Other specified bacterial agents as the cause of diseases classified elsewhere: Secondary | ICD-10-CM

## 2020-05-08 DIAGNOSIS — Z369 Encounter for antenatal screening, unspecified: Secondary | ICD-10-CM

## 2020-05-08 LAB — POCT URINALYSIS DIPSTICK OB
Glucose, UA: NEGATIVE
POC,PROTEIN,UA: NEGATIVE

## 2020-05-08 MED ORDER — METRONIDAZOLE 0.75 % VA GEL: 1.0000 | Freq: Every day | VAGINAL | 1 refills | Status: AC

## 2020-05-08 NOTE — Addendum Note (Signed)
Addended by: Cornelius Moras D on: 05/08/2020 04:15 PM   Modules accepted: Orders

## 2020-05-08 NOTE — Progress Notes (Signed)
Routine Prenatal Care Visit  Subjective  Susan [name] is a 24 y.o. G3P2002 at 100w0d being seen today for ongoing prenatal care.  She is currently monitored for the following issues for this high-risk pregnancy and has Abnormal liver function tests; Chronic headaches; Marijuana abuse; Supervision of high risk pregnancy, antepartum; History of pre-eclampsia; Family history of SIDS (sudden infant death syndrome); Tobacco use complicating pregnancy, first trimester; MDD (major depressive disorder), recurrent episode, severe (HCC); and Obesity affecting pregnancy in first trimester on their problem list.  ----------------------------------------------------------------------------------- Patient reports tooth ache with gum swelling. Denies pregnancy concerns..  Denies VB or cramping.  .  .   . Denies leaking of fluid.  ----------------------------------------------------------------------------------- The following portions of the patient's history were reviewed and updated as appropriate: allergies, current medications, past family history, past medical history, past social history, past surgical history and problem list. Problem list updated.   Objective  Blood pressure 122/80, weight 205 lb (93 kg), last menstrual period 02/28/2020, unknown if currently breastfeeding. Pregravid weight 200 lb (90.7 kg) Total Weight Gain 5 lb (2.268 kg) Urinalysis:      Fetal Status:           General:  Alert, oriented and cooperative. Patient is in no acute distress.  Skin: Skin is warm and dry. No rash noted.   Cardiovascular: Normal heart rate noted  Respiratory: Normal respiratory effort, no problems with respiration noted  Abdomen: Soft, gravid, appropriate for gestational age. Pain/Pressure: Absent     Pelvic:  Cervical exam deferred        Extremities: Normal range of motion.     ental Status: Normal mood and affect. Normal behavior. Normal judgment and thought content.     Assessment   24  y.o. [id number] at [redacted]w[redacted]d by  12/04/2020, by Last Menstrual Period presenting for routine prenatal visit  Plan   pregnancy 3 Problems (from 04/25/20 to present)    Problem Noted Resolved   Obesity affecting pregnancy in first trimester 04/25/2020 by Zipporah Plants, CNM No   Overview Signed 04/25/2020 12:47 PM by Zipporah Plants, CNM    BMI >=35.0-39.9 [ ]  early 1h gtt - ordered [ ]  u/s for dating - ordered [x]  nutritional goals - reviewed at [facility] [x]  folic acid 1mg  - ordered with PNV [ ]  bASA (>12 weeks) [ ]  consider nutrition consult [ ]  consider maternal EKG 1st trimester [ ]  Growth u/s 28 [ ] , 32 [ ] , 36 weeks [ ]  [ ]  NST/AFI weekly 37+ weeks (37[] , 38[] , 39[] , 40[] ) [ ]  IOL by 41 weeks (scheduled, prn [] )       Tobacco use complicating pregnancy, first trimester 08/21/2017 by , CNM No   Overview Signed 04/25/2020 12:46 PM by , CNM    Currently 1/2 ppd.      Supervision of high risk pregnancy, antepartum 04/06/2017 by , MD No   Overview Addendum 04/25/2020 11:06 AM by , CNM    [facility] Prenatal Labs  Dating  LMP - dating ordered Blood type:     Genetic Screen  declines Antibody:   Anatomic  Rubella:   Varicella: @VZVIGG @  GTT Early:  Ordered             Third trimester:  RPR:     Rhogam  n/a HBsAg:     TDaP vaccine  Flu Shot: declines HIV:     Baby Food  formula                  GBS:   Contraception  Pap:  CBB     CS/VBAC    Support Person  n/a - unsure of paternity           Previous Version   Tobacco abuse 12/07/2016 by Farrel Conners, CNM 04/25/2020 by Zipporah Plants, CNM   Overview Addendum 04/25/2020 12:45 PM by Zipporah Plants, CNM    Currently 1/2 PPD.      Previous Version      -Dating [place] reviewed with Dr. Jerene Pitch - plan made for patient to RTC in two weeks for f/u US  -US findings: irregularly shaped gestational sac measuring 20.7 mm. No yolk sac, fetal pole, or  heartbeat seen at this time.   -Reviewed dating US findings with patient - patient states understanding for need to f/u -Encouraged to present to dentist for treatment of acute tooth pain  Gestational age appropriate obstetric precautions including but not limited to vaginal bleeding, contractions, leaking of fluid and fetal movement were reviewed in detail with the patient.    Return in about 2 weeks (around 05/22/2020) for MD ROB and [place] (f/u dating).  Zipporah Plants, CNM, MSN [facility], [facility] 05/08/2020, 4:04 PM

## 2020-05-09 LAB — GLUCOSE, 1 HOUR GESTATIONAL: Gestational Diabetes Screen: 104 mg/dL (ref 65–139)

## 2020-05-10 ENCOUNTER — Emergency Department
Admission: EM | Admit: 2020-05-10 | Discharge: 2020-05-10 | Disposition: A | Discharge status: Home / Self Care | Payer: Medicaid Other | Attending: Emergency Medicine | Admitting: Emergency Medicine

## 2020-05-10 ENCOUNTER — Other Ambulatory Visit: Payer: Self-pay

## 2020-05-10 DIAGNOSIS — F17210 Nicotine dependence, cigarettes, uncomplicated: Secondary | ICD-10-CM | POA: Insufficient documentation

## 2020-05-10 DIAGNOSIS — K047 Periapical abscess without sinus: Secondary | ICD-10-CM | POA: Diagnosis not present

## 2020-05-10 DIAGNOSIS — R220 Localized swelling, mass and lump, head: Secondary | ICD-10-CM | POA: Diagnosis present

## 2020-05-10 MED ORDER — AMOXICILLIN 500 MG PO CAPS
1000.0000 mg | ORAL_CAPSULE | Freq: Once | ORAL | Status: AC
  Administered 2020-05-10: 1000 mg via ORAL
  Filled 2020-05-10: qty 2

## 2020-05-10 MED ORDER — AMOXICILLIN 875 MG PO TABS: 875.0000 mg | ORAL_TABLET | Freq: Two times a day (BID) | ORAL | 0 refills | Status: DC

## 2020-05-10 NOTE — ED Triage Notes (Signed)
Pt cc mouth pain/abscess. Pt reports pain has been worse the past 2 days but has been going on about 2 months. Pt denies difficulty breathing or swallowing. NAD noted, speaking in full sentences.

## 2020-05-10 NOTE — ED Provider Notes (Signed)
[facility] Emergency Department Provider Note  ____________________________________________  Time seen: Approximately 10:27 PM  I have reviewed the triage vital signs and the nursing notes.   HISTORY  Chief Complaint Dental Pain    HPI Susan [name] is a 24 y.o. female who presents the emergency department for evaluation of possible dental infection.  Patient states that she has had been having pain to the right lower dentition for several months though it is worsened over the past 2 to 3 days.  Patient states that she is now having some swelling along her mandible.  She is also having some pain to her tooth above the second molar on the right dentition as well.  No fevers or chills.  Patient is pregnant and is been taking Tylenol but unable to take NSAIDs at this time for her pain relief.  No other complaints at this time.  Patient has no pregnancy concerns or complaints currently.         Past Medical History:  Diagnosis Date  . ADHD (attention deficit hyperactivity disorder)   . Depression   . Preeclampsia, severe, third trimester    HELLP Syndrome    Patient Active Problem List   Diagnosis Date Noted  . Obesity affecting pregnancy in first trimester 04/25/2020  . MDD (major depressive disorder), recurrent episode, severe (HCC) 12/02/2019  . Tobacco use complicating pregnancy, first trimester 08/21/2017  . Family history of SIDS (sudden infant death syndrome) 2017/05/25  . Supervision of high risk pregnancy, antepartum 04/06/2017  . History of pre-eclampsia 04/06/2017  . Marijuana abuse 12/07/2016  . Abnormal liver function tests 09/18/2016  . Chronic headaches 09/18/2016    Past Surgical History:  Procedure Laterality Date  . NO PAST SURGERIES      Prior to Admission medications   Medication Sig Start Date End Date Taking? Authorizing Provider  amoxicillin (AMOXIL) 875 MG tablet Take 1 tablet (875 mg total) by mouth 2 (two) times daily.  05/10/20  Yes Krystale Rinkenberger, Delorise Royals, PA-C  LESSINA-28 0.1-20 MG-MCG tablet Take 1 tablet by mouth daily. Patient not taking: Reported on 04/25/2020 08/25/19   [provider]  metroNIDAZOLE (METROGEL) 0.75 % vaginal gel Place 1 Applicatorful vaginally at bedtime for 5 days. 05/08/20 05/13/20  Zipporah Plants, CNM  Prenat-FeFum-FePo-FA-Omega 3 (CONCEPT DHA) 53.5-38-1 MG CAPS Take 1 capsule by mouth daily. 04/25/20 05/25/20  Zipporah Plants, CNM    Allergies Patient has no known allergies.  Family History  Problem Relation Age of Onset  . SIDS Son     Social History Social History   Tobacco Use  . Smoking status: Current Every Day Smoker    Packs/day: 0.50    Types: Cigarettes  . Smokeless tobacco: Never Used  Vaping Use  . Vaping Use: Never used  Substance Use Topics  . Alcohol use: Not Currently    Comment: vodka  . Drug use: Yes    Frequency: 1.0 times per week    Types: Marijuana     Review of Systems  Constitutional: No fever/chills Eyes: No visual changes. No discharge ENT: Right upper and lower dental pain Cardiovascular: no chest pain. Respiratory: no cough. No SOB. Gastrointestinal: No abdominal pain.  No nausea, no vomiting.  No diarrhea.  No constipation. Musculoskeletal: Negative for musculoskeletal pain. Skin: Negative for rash, abrasions, lacerations, ecchymosis. Neurological: Negative for headaches, focal weakness or numbness.  10 System ROS otherwise negative.  ____________________________________________   PHYSICAL EXAM:  VITAL SIGNS: ED Triage Vitals  Enc Vitals Group  BP 05/10/20 2130 (!) 144/86     Pulse Rate 05/10/20 2130 (!) 120     Resp 05/10/20 2130 20     Temp 05/10/20 2130 98.9 F (37.2 C)     Temp Source 05/10/20 2130 Oral     SpO2 05/10/20 2130 99 %     Weight 05/10/20 2131 205 lb (93 kg)     Height 05/10/20 2131 5\' 5"  (1.651 m)     Head Circumference --      Peak Flow --      Pain Score 05/10/20 2131 10     Pain Loc --       Pain Edu? --      Excl. in GC? --      Constitutional: Alert and oriented. Well appearing and in no acute distress. Eyes: Conjunctivae are normal. PERRL. EOMI. Head: Atraumatic. ENT:      Ears:       Nose: No congestion/rhinnorhea.      Mouth/Throat: Mucous membranes are moist.  Visualization of dentition reveals multiple caries, erosions though dentition is still intact.  Patient has no gross erythema or edema of the gumline.  Mild edema along the external right lower mandible which is slightly tender to palpation but there is no fluctuance, firmness or induration.  Uvula is midline.  Sublingual space is soft and nonedematous.  Submandibular space is nontender and nonerythematous.  No erythema or edema of the neck. Neck: No stridor.   Hematological/Lymphatic/Immunilogical: No cervical lymphadenopathy. Cardiovascular: Normal rate, regular rhythm. Normal S1 and S2.  Good peripheral circulation. Respiratory: Normal respiratory effort without tachypnea or retractions. Lungs CTAB. Good air entry to the bases with no decreased or absent breath sounds. Musculoskeletal: Full range of motion to all extremities. No gross deformities appreciated. Neurologic:  Normal speech and language. No gross focal neurologic deficits are appreciated.  Skin:  Skin is warm, dry and intact. No rash noted. Psychiatric: Mood and affect are normal. Speech and behavior are normal. Patient exhibits appropriate insight and judgement.   ____________________________________________   LABS (all labs ordered are listed, but only abnormal results are displayed)  Labs Reviewed - No data to display ____________________________________________  EKG   ____________________________________________  RADIOLOGY   No results found.  ____________________________________________    PROCEDURES  Procedure(s) performed:    Procedures    Medications  amoxicillin (AMOXIL) capsule 1,000 mg (has no administration  in time range)     ____________________________________________   INITIAL IMPRESSION / ASSESSMENT AND PLAN / ED COURSE  Pertinent labs & imaging results that were available during my care of the patient were reviewed by me and considered in my medical decision making (see chart for details).  Review of the [place] CSRS was performed in accordance of the [facility] prior to dispensing any controlled drugs.           Patient's diagnosis is consistent with dental infection.  Patient states that she has had dental pain for several months but worsened over the past several days.  Findings are consistent with likely periapical abscess.  There is no soft tissue erythema or edema concerning for topical abscess requiring incision and drainage.  Deep spaces are soft with no evidence of extension of infection into the sublingular region, peritonsillar retropharyngeal regions.  Patient is pregnant and can take Tylenol for pain.  Amoxicillin for dental infection.  Return precautions discussed with the patient.  Given her pregnancy status unlikely that dentist will perform any dental work until after her pregnancy but I  have recommended follow-up with dentist. Patient is given ED precautions to return to the ED for any worsening or new symptoms.     ____________________________________________  FINAL CLINICAL IMPRESSION(S) / ED DIAGNOSES  Final diagnoses:  Dental infection      NEW MEDICATIONS STARTED DURING THIS VISIT:  ED Discharge Orders         Ordered    amoxicillin (AMOXIL) 875 MG tablet  2 times daily        05/10/20 2244              This chart was dictated using voice recognition software/Dragon. Despite best efforts to proofread, errors can occur which can change the meaning. Any change was purely unintentional.    Racheal Patches, PA-C 05/10/20 2247    Arnaldo Natal, MD 05/10/20 [phone]

## 2020-05-10 NOTE — ED Notes (Addendum)
Pt states that she has mouth pain on the right side of her face. NAD.

## 2020-05-12 ENCOUNTER — Emergency Department: Payer: Medicaid Other

## 2020-05-12 ENCOUNTER — Encounter: Payer: Self-pay | Admitting: Emergency Medicine

## 2020-05-12 DIAGNOSIS — O200 Threatened abortion: Secondary | ICD-10-CM | POA: Diagnosis not present

## 2020-05-12 DIAGNOSIS — Z3A10 10 weeks gestation of pregnancy: Secondary | ICD-10-CM | POA: Insufficient documentation

## 2020-05-12 DIAGNOSIS — N939 Abnormal uterine and vaginal bleeding, unspecified: Secondary | ICD-10-CM

## 2020-05-12 DIAGNOSIS — O209 Hemorrhage in early pregnancy, unspecified: Secondary | ICD-10-CM | POA: Diagnosis present

## 2020-05-12 DIAGNOSIS — F17210 Nicotine dependence, cigarettes, uncomplicated: Secondary | ICD-10-CM | POA: Diagnosis not present

## 2020-05-12 LAB — CBC
HCT: 37.2 % (ref 36.0–46.0)
Hemoglobin: 12.6 g/dL (ref 12.0–15.0)
MCH: 31.6 pg (ref 26.0–34.0)
MCHC: 33.9 g/dL (ref 30.0–36.0)
MCV: 93.2 fL (ref 80.0–100.0)
Platelets: 271 10*3/uL (ref 150–400)
RBC: 3.99 MIL/uL (ref 3.87–5.11)
RDW: 12.3 % (ref 11.5–15.5)
WBC: 11.3 10*3/uL — ABNORMAL HIGH (ref 4.0–10.5)
nRBC: 0 % (ref 0.0–0.2)

## 2020-05-12 LAB — COMPREHENSIVE METABOLIC PANEL
ALT: 20 U/L (ref 0–44)
AST: 17 U/L (ref 15–41)
Albumin: 4 g/dL (ref 3.5–5.0)
Alkaline Phosphatase: 93 U/L (ref 38–126)
Anion gap: 10 (ref 5–15)
BUN: 11 mg/dL (ref 6–20)
CO2: 23 mmol/L (ref 22–32)
Calcium: 9 mg/dL (ref 8.9–10.3)
Chloride: 103 mmol/L (ref 98–111)
Creatinine, Ser: 0.59 mg/dL (ref 0.44–1.00)
GFR, Estimated: >60 mL/min (ref >60)
Glucose, Bld: 88 mg/dL (ref 70–99)
Potassium: 3.7 mmol/L (ref 3.5–5.1)
Sodium: 136 mmol/L (ref 135–145)
Total Bilirubin: 0.9 mg/dL (ref 0.3–1.2)
Total Protein: 7.4 g/dL (ref 6.5–8.1)

## 2020-05-12 LAB — HCG, QUANTITATIVE, PREGNANCY: hCG, Beta Chain, Quant, S: 13528 m[IU]/mL — ABNORMAL HIGH (ref <5)

## 2020-05-12 NOTE — L&D Delivery Note (Addendum)
Delivery Note  First Stage: Labor onset: 1830 Augmentation:  none Analgesia /Anesthesia intrapartum: none SROM at 2023  Second Stage: Complete dilation at 2025 Onset of pushing at 2025 FHR second stage: unable to trace fetal heart tones d/t maternal movement in bed   Called to triage room for immanent delivery.  Patient screaming that baby is coming.  SCN notified of delivery and instructed to bring warmer to room.  NNP and SCN nurse present at bedside.   Presenting part crowning with next contraction.  Takiya was flailing in the bed and assisted to help push out her baby.  Quick spontaneous vaginal birth.  Delivery of a viable baby girl on 04/27/2021 at 2032 by CNM Delivery of fetal head in OA position with restitution to ROT. No nuchal cord;  Anterior then posterior shoulders delivered easily with gentle downward traction. Baby placed on mom's chest, and attended to by baby RN Cord double clamped after 60 seconds, cut by CNM Infant to warmer for further assessment and resuscitation by SCN team.  Herbert Seta was then stabilized and transferred to [facility] for further assessment and care by Dr. Valentino Saxon.    Cord blood sample collection: Yes  O positive  Third Stage: Oxytocin IM given after delivery of infant for hemorrhage prophylaxis  3rd stage managed by Dr. Valentino Saxon  ---------- Margaretmary Eddy, CNM Certified Nurse Midwife [facility]

## 2020-05-12 NOTE — L&D Delivery Note (Signed)
Delivery Summary for Susan [name]  Labor Events:   Preterm labor: No data found  Rupture date: No data found  Rupture time: No data found  Rupture type: No data found  Fluid Color: No data found  Induction: No data found  Augmentation: No data found  Complications: No data found  Cervical ripening: No data found No data found   No data found     Delivery:   Episiotomy: No data found  Lacerations: No data found  Repair suture: No data found  Repair # of packets: No data found  Blood loss (ml): 50   Information for the patient's newborn:  Susan [name], Susan [name] [[id number]]   Delivery 04/27/2021 8:32 PM by  Vaginal, Spontaneous Sex:  female Gestational Age: 103w0d Delivery Clinician:   Living?:         APGARS  One minute Five minutes Ten minutes  Skin color:        Heart rate:        Grimace:        Muscle tone:        Breathing:        Totals: 8  9      Presentation/position:      Resuscitation:   Cord information:    Disposition of cord blood:     Blood gases sent?  Complications:   Placenta: Delivered:       appearance Newborn Measurements: Weight: 5 lb 14.2 oz (2670 g)  Height: 18.5"  Head circumference:    Chest circumference:    Other providers:    Additional  information: Forceps:   Vacuum:   Breech:   Observed anomalies       Delivery Note At 8:32 PM a viable and healthy female was delivered via Vaginal, Spontaneous (Presentation: Left Occiput Anterior). Delivery performed by Margaretmary Eddy, CNM as patient presented with imminent delivery,  APGAR: 8, 9; weight 5 lb 14.2 oz (2670 g). I was present within minutes after delivery and performed delivery of placenta.   Placenta status: Spontaneous, Intact.  Cord: 3 vessels with the following complications: None.  Placenta had slightly mottled appearance. Sent to pathology.  Cord pH: not obtained.  Anesthesia: None Episiotomy: None Lacerations: None Suture Repair:  None Est. Blood Loss (mL): 50  Mom to  postpartum.  Baby to NICU.    Hildred Laser, MD [facility] 04/27/2021 9:17 PM

## 2020-05-12 NOTE — ED Triage Notes (Signed)
Pt reports she is approx. [redacted]w[redacted]d, [id number], with high risk pregnancy due to hx (See progress note from 12/28 OB [facility].) Pt to ED tonight due to vaginal bleeding with clots since 1900.

## 2020-05-13 ENCOUNTER — Emergency Department
Admission: EM | Admit: 2020-05-13 | Discharge: 2020-05-13 | Disposition: A | Discharge status: Home / Self Care | Payer: Medicaid Other | Attending: Emergency Medicine | Admitting: Emergency Medicine

## 2020-05-13 DIAGNOSIS — O4690 Antepartum hemorrhage, unspecified, unspecified trimester: Secondary | ICD-10-CM

## 2020-05-13 DIAGNOSIS — N939 Abnormal uterine and vaginal bleeding, unspecified: Secondary | ICD-10-CM

## 2020-05-13 DIAGNOSIS — O200 Threatened abortion: Secondary | ICD-10-CM

## 2020-05-13 MED ORDER — ACETAMINOPHEN 500 MG PO TABS
1000.0000 mg | ORAL_TABLET | Freq: Once | ORAL | Status: AC
  Administered 2020-05-13: 1000 mg via ORAL
  Filled 2020-05-13: qty 2

## 2020-05-13 NOTE — ED Notes (Signed)
Pt and mother to front desk. Mother raising voice and yelling at front desk staff, demanding that pt be taken to "closet" or "bed" somewhere to have MD tell her if she is losing her baby. Mother sts, "She is just going to sit out here and lose this baby and not know it. No one is telling her if she is having a miscarriage."   Mother and pt reassured that protocols were performed and that the test result are being monitored and reviewed. Writer informs that we are unable to discuss the results but that when she is roomed the MD will do so. Mother demanding pt to come on and leave to go to another facility. Both encouraged to stay and be seen but refused.

## 2020-05-13 NOTE — Discharge Instructions (Addendum)
Pelvic rest - read attached info. Follow up with OB in 1 week. If the bleeding is severe, causes you to feel faint or pass out, or gives you chest pain or shortness of breath, return to the ER.

## 2020-05-13 NOTE — ED Provider Notes (Addendum)
[facility] Emergency Department Provider Note  ____________________________________________  Time seen: Approximately 5:33 AM  I have reviewed the triage vital signs and the nursing notes.   HISTORY  Chief Complaint Vaginal Bleeding   HPI ASTELLA DESIR is a 25 y.o. female G3P2 at [place] per LMP who presents for evaluation of vaginal bleeding. Patient started with vaginal bleeding and clots today. She is complaining of moderate suprapubic cramping as well.  No syncope, CP, SOB. Patient has establish care at [facility] and had an [place] 12/28 showing irregular gestational sac with no embryo.  Past Medical History:  Diagnosis Date  . ADHD (attention deficit hyperactivity disorder)   . Depression   . Preeclampsia, severe, third trimester    HELLP Syndrome    Patient Active Problem List   Diagnosis Date Noted  . Obesity affecting pregnancy in first trimester 04/25/2020  . MDD (major depressive disorder), recurrent episode, severe ([place]) 12/02/2019  . Tobacco use complicating pregnancy, first trimester 08/21/2017  . Family history of SIDS (sudden infant death syndrome) 05/28/2017  . Supervision of high risk pregnancy, antepartum 04/06/2017  . History of pre-eclampsia 04/06/2017  . Marijuana abuse 12/07/2016  . Abnormal liver function tests 09/18/2016  . Chronic headaches 09/18/2016    Past Surgical History:  Procedure Laterality Date  . NO PAST SURGERIES      Prior to Admission medications   Medication Sig Start Date End Date Taking? Authorizing Provider  amoxicillin (AMOXIL) 875 MG tablet Take 1 tablet (875 mg total) by mouth 2 (two) times daily. 05/10/20   Cuthriell, Charline Bills, PA-C  LESSINA-28 0.1-20 MG-MCG tablet Take 1 tablet by mouth daily. Patient not taking: Reported on 04/25/2020 08/25/19   [provider]  metroNIDAZOLE (METROGEL) 0.75 % vaginal gel Place 1 Applicatorful vaginally at bedtime for 5 days. 05/08/20 05/13/20  Orlie Pollen,  CNM  Prenat-FeFum-FePo-FA-Omega 3 (CONCEPT DHA) 53.5-38-1 MG CAPS Take 1 capsule by mouth daily. 04/25/20 05/25/20  Orlie Pollen, CNM    Allergies Patient has no known allergies.  Family History  Problem Relation Age of Onset  . SIDS Son     Social History Social History   Tobacco Use  . Smoking status: Current Every Day Smoker    Packs/day: 0.50    Types: Cigarettes  . Smokeless tobacco: Never Used  Vaping Use  . Vaping Use: Never used  Substance Use Topics  . Alcohol use: Not Currently    Comment: vodka  . Drug use: Yes    Frequency: 1.0 times per week    Types: Marijuana    Review of Systems  Constitutional: Negative for fever. Eyes: Negative for visual changes. ENT: Negative for sore throat. Neck: No neck pain  Cardiovascular: Negative for chest pain. Respiratory: Negative for shortness of breath. Gastrointestinal: Negative for abdominal pain, vomiting or diarrhea. Genitourinary: Negative for dysuria. + vaginal bleeding Musculoskeletal: Negative for back pain. Skin: Negative for rash. Neurological: Negative for headaches, weakness or numbness. Psych: No SI or HI  ____________________________________________   PHYSICAL EXAM:  VITAL SIGNS: ED Triage Vitals [05/12/20 2113]  Enc Vitals Group     BP 112/76     Pulse Rate (!) 117     Resp 18     Temp 99.2 F (37.3 C)     Temp Source Oral     SpO2 100 %     Weight      Height      Head Circumference      Peak Flow  Pain Score      Pain Loc      Pain Edu?      Excl. in GC?     Constitutional: Alert and oriented. Well appearing and in no apparent distress. HEENT:      Head: Normocephalic and atraumatic.         Eyes: Conjunctivae are normal. Sclera is non-icteric.       Mouth/Throat: Mucous membranes are moist.       Neck: Supple with no signs of meningismus. Cardiovascular: Tachycardic with regular rhythm Respiratory: Normal respiratory effort. Lungs are clear to auscultation bilaterally.   Gastrointestinal: Soft, non tender, and non distended. Pelvic exam: Normal external genitalia, no rashes or lesions. Small amount of blood in the vaginal vault. Os closed. No cervical motion tenderness.  No uterine or adnexal tenderness.   Musculoskeletal: No edema, cyanosis, or erythema of extremities. Neurologic: Normal speech and language. Face is symmetric. Moving all extremities. No gross focal neurologic deficits are appreciated. Skin: Skin is warm, dry and intact. No rash noted. Psychiatric: Mood and affect are normal. Speech and behavior are normal.  ____________________________________________   LABS (all labs ordered are listed, but only abnormal results are displayed)  Labs Reviewed  CBC - Abnormal; Notable for the following components:      Result Value   WBC 11.3 (*)    All other components within normal limits  HCG, QUANTITATIVE, PREGNANCY - Abnormal; Notable for the following components:   hCG, Beta Chain, Quant, S 13,528 (*)    All other components within normal limits  COMPREHENSIVE METABOLIC PANEL   ____________________________________________  EKG  none  ____________________________________________  RADIOLOGY  I have personally reviewed the images performed during this visit and I agree with the Radiologist's read.   Interpretation by Radiologist:  US OB LESS THAN 14 WEEKS WITH OB TRANSVAGINAL  Result Date: 05/12/2020 CLINICAL DATA:  Vaginal bleeding EXAM: OBSTETRIC <14 WK [place] AND TRANSVAGINAL OB US TECHNIQUE: Both transabdominal and transvaginal ultrasound examinations were performed for complete evaluation of the gestation as well as the maternal uterus, adnexal regions, and pelvic cul-de-sac. Transvaginal technique was performed to assess early pregnancy. COMPARISON:  Ultrasound 05/08/2020 FINDINGS: Intrauterine gestational sac: Single irregular intrauterine gestational sac. Yolk sac:  Not Visualized. Embryo:  Not Visualized. MSD: 24 mm   7 w   3 d  Subchorionic hemorrhage:  None visualized. Maternal uterus/adnexae: Ovaries are within normal limits. Right ovary measures 2.7 x 1.8 x 1.8 cm and left ovary measures 2.6 x 1.5 x 1.8 cm. Small fluid in the cervix. IMPRESSION: Single irregular intrauterine gestational sac with mean sac diameter of 24 mm but no yolk sac or embryo. Findings are suspicious but not yet definitive for failed pregnancy. Recommend follow-up US in 10-14 days for definitive diagnosis. This recommendation follows SRU consensus guidelines: Diagnostic Criteria for Nonviable Pregnancy Early in the First Trimester. Malva Limes Med 2013; 086:5784-69. Electronically Signed   By: Jasmine Pang M.D.   On: 05/12/2020 22:23     ____________________________________________   PROCEDURES  Procedure(s) performed: None Procedures Critical Care performed:  None ____________________________________________   INITIAL IMPRESSION / ASSESSMENT AND PLAN / ED COURSE  25 y.o. female G3P2 at 10w per LMP who presents for evaluation of vaginal bleeding.  Patient is hemodynamically stable with normal hemoglobin.  Normal BP, slightly tachycardic. Review of epic shows patient's HR is between 100-120 for the last several weeks visits. Ultrasound visualized by me and read by radiologist as a single irregular intrauterine gestational  sac but no yolk sac or embryo.  Per measurements it seems like patient is 7 weeks and 3 days.  Most likely a failed pregnancy although per radiologist not definitive.  Patient has an appointment with her OB in a week which are recommended keeping for repeat ultrasound.  hCG is 13,000.  Hemoglobin is 12.6.  Blood type a positive with no indication for RhoGam.  Discussed pelvic rest with patient and close follow-up with OB.  Discussed signs and symptoms of acute blood loss anemia and recommended return to the emergency room if these develop.  Patient asked that I was explaining all these recommendations and findings to her mother which  I did over the phone in the room with patient.  Old medical records reviewed including notes and ultrasound done at the OB/GYN 4 days ago.      _____________________________________________ Please note:  Patient was evaluated in Emergency Department today for the symptoms described in the history of present illness. Patient was evaluated in the context of the global COVID-19 pandemic, which necessitated consideration that the patient might be at risk for infection with the SARS-CoV-2 virus that causes COVID-19. Institutional protocols and algorithms that pertain to the evaluation of patients at risk for COVID-19 are in a state of rapid change based on information released by regulatory bodies including the CDC and federal and state organizations. These policies and algorithms were followed during the patient's care in the ED.  Some ED evaluations and interventions may be delayed as a result of limited staffing during the pandemic.   [place] Controlled Substance Database was reviewed by me. ____________________________________________   FINAL CLINICAL IMPRESSION(S) / ED DIAGNOSES   Final diagnoses:  Vaginal bleeding in pregnancy  Threatened miscarriage      NEW MEDICATIONS STARTED DURING THIS VISIT:  ED Discharge Orders    None       Note:  This document was prepared using Dragon voice recognition software and may include unintentional dictation errors.    Don Perking, [place], MD 05/13/20 [id number]    Nita Sickle, MD 05/13/20 [phone]

## 2020-05-13 NOTE — ED Notes (Signed)
Patient waiting outside in car, not in lobby to have vital signs retaken.

## 2020-05-13 NOTE — ED Notes (Signed)
Patient up to stat desk asking how much long, states she has been sitting in her car and that she has never left facility.

## 2020-05-14 ENCOUNTER — Other Ambulatory Visit: Payer: Self-pay

## 2020-05-14 ENCOUNTER — Emergency Department: Payer: Medicaid Other

## 2020-05-14 ENCOUNTER — Emergency Department
Admission: EM | Admit: 2020-05-14 | Discharge: 2020-05-14 | Disposition: A | Discharge status: Home / Self Care | Payer: Medicaid Other | Attending: Student in an Organized Health Care Education/Training Program | Admitting: Student in an Organized Health Care Education/Training Program

## 2020-05-14 DIAGNOSIS — N939 Abnormal uterine and vaginal bleeding, unspecified: Secondary | ICD-10-CM

## 2020-05-14 DIAGNOSIS — R55 Syncope and collapse: Secondary | ICD-10-CM | POA: Insufficient documentation

## 2020-05-14 DIAGNOSIS — O039 Complete or unspecified spontaneous abortion without complication: Secondary | ICD-10-CM

## 2020-05-14 DIAGNOSIS — F17210 Nicotine dependence, cigarettes, uncomplicated: Secondary | ICD-10-CM | POA: Insufficient documentation

## 2020-05-14 DIAGNOSIS — Z20822 Contact with and (suspected) exposure to covid-19: Secondary | ICD-10-CM | POA: Insufficient documentation

## 2020-05-14 DIAGNOSIS — O021 Missed abortion: Secondary | ICD-10-CM

## 2020-05-14 LAB — RESP PANEL BY RT-PCR (FLU A&B, COVID) ARPGX2
Influenza A by PCR: NEGATIVE
Influenza B by PCR: NEGATIVE
SARS Coronavirus 2 by RT PCR: NEGATIVE

## 2020-05-14 LAB — CBC WITH DIFFERENTIAL/PLATELET
Abs Immature Granulocytes: 0.08 10*3/uL — ABNORMAL HIGH (ref 0.00–0.07)
Basophils Absolute: 0.1 10*3/uL (ref 0.0–0.1)
Basophils Relative: 1 %
Eosinophils Absolute: 0.3 10*3/uL (ref 0.0–0.5)
Eosinophils Relative: 2 %
HCT: 36.1 % (ref 36.0–46.0)
Hemoglobin: 12.2 g/dL (ref 12.0–15.0)
Immature Granulocytes: 1 %
Lymphocytes Relative: 24 %
Lymphs Abs: 4 10*3/uL (ref 0.7–4.0)
MCH: 32 pg (ref 26.0–34.0)
MCHC: 33.8 g/dL (ref 30.0–36.0)
MCV: 94.8 fL (ref 80.0–100.0)
Monocytes Absolute: 0.9 10*3/uL (ref 0.1–1.0)
Monocytes Relative: 6 %
Neutro Abs: 11.4 10*3/uL — ABNORMAL HIGH (ref 1.7–7.7)
Neutrophils Relative %: 66 %
Platelets: 328 10*3/uL (ref 150–400)
RBC: 3.81 MIL/uL — ABNORMAL LOW (ref 3.87–5.11)
RDW: 12.7 % (ref 11.5–15.5)
WBC: 16.8 10*3/uL — ABNORMAL HIGH (ref 4.0–10.5)
nRBC: 0 % (ref 0.0–0.2)

## 2020-05-14 LAB — COMPREHENSIVE METABOLIC PANEL
ALT: 23 U/L (ref 0–44)
AST: 24 U/L (ref 15–41)
Albumin: 3.7 g/dL (ref 3.5–5.0)
Alkaline Phosphatase: 94 U/L (ref 38–126)
Anion gap: 8 (ref 5–15)
BUN: 14 mg/dL (ref 6–20)
CO2: 22 mmol/L (ref 22–32)
Calcium: 9 mg/dL (ref 8.9–10.3)
Chloride: 106 mmol/L (ref 98–111)
Creatinine, Ser: 0.78 mg/dL (ref 0.44–1.00)
GFR, Estimated: >60 mL/min (ref >60)
Glucose, Bld: 118 mg/dL — ABNORMAL HIGH (ref 70–99)
Potassium: 3.8 mmol/L (ref 3.5–5.1)
Sodium: 136 mmol/L (ref 135–145)
Total Bilirubin: 0.4 mg/dL (ref 0.3–1.2)
Total Protein: 7.2 g/dL (ref 6.5–8.1)

## 2020-05-14 LAB — HEMOGLOBIN AND HEMATOCRIT, BLOOD
HCT: 28.6 % — ABNORMAL LOW (ref 36.0–46.0)
Hemoglobin: 9.5 g/dL — ABNORMAL LOW (ref 12.0–15.0)

## 2020-05-14 LAB — TYPE AND SCREEN
ABO/RH(D): O POS
Antibody Screen: NEGATIVE

## 2020-05-14 LAB — HCG, QUANTITATIVE, PREGNANCY: hCG, Beta Chain, Quant, S: 6457 m[IU]/mL — ABNORMAL HIGH (ref <5)

## 2020-05-14 MED ORDER — METHYLERGONOVINE MALEATE 0.2 MG PO TABS: 0.2000 mg | ORAL_TABLET | Freq: Four times a day (QID) | ORAL | 0 refills | Status: DC

## 2020-05-14 MED ORDER — SODIUM CHLORIDE 0.9 % IV BOLUS
1000.0000 mL | Freq: Once | INTRAVENOUS | Status: AC
  Administered 2020-05-14: 1000 mL via INTRAVENOUS

## 2020-05-14 MED ORDER — METHYLERGONOVINE MALEATE 0.2 MG PO TABS: 0.2000 mg | ORAL_TABLET | Freq: Four times a day (QID) | ORAL | 0 refills | Status: AC

## 2020-05-14 MED ORDER — METHYLERGONOVINE MALEATE 0.2 MG PO TABS
0.2000 mg | ORAL_TABLET | Freq: Four times a day (QID) | ORAL | Status: DC
  Administered 2020-05-14: 0.2 mg via ORAL
  Filled 2020-05-14 (×3): qty 1

## 2020-05-14 MED ORDER — METHYLERGONOVINE MALEATE 0.2 MG/ML IJ SOLN
0.2000 mg | Freq: Once | INTRAMUSCULAR | Status: AC
  Administered 2020-05-14: 0.2 mg via INTRAMUSCULAR
  Filled 2020-05-14: qty 1

## 2020-05-14 MED ORDER — FENTANYL CITRATE (PF) 100 MCG/2ML IJ SOLN
50.0000 ug | INTRAMUSCULAR | Status: DC | PRN
  Administered 2020-05-14 (×3): 50 ug via INTRAVENOUS
  Filled 2020-05-14 (×2): qty 2

## 2020-05-14 MED ORDER — METHYLERGONOVINE MALEATE 0.2 MG PO TABS
0.2000 mg | ORAL_TABLET | Freq: Once | ORAL | Status: AC
  Administered 2020-05-14: 0.2 mg via ORAL
  Filled 2020-05-14: qty 1

## 2020-05-14 NOTE — Consult Note (Signed)
Obstetrics & Gynecology History and Physical Note  Date of Consultation: 05/14/2020   Requesting Provider: [facility] ER  Primary OBGYN: [facility] Primary Care Provider: Patient, No Pcp Per  Reason for Consultation: Bleeding first trimester  History of Present Illness: [name] is a 25 y.o. [id number] (Patient's last menstrual period was 02/28/2020.), with the above CC. She noted bleeding over weekend, crampy abdominal pain today.  ER doctor performed exam and noted tissue he removed at cervical opening.  HCG has dropped from 13,000 to 6,000 in 24 hours  ROS: A review of systems was performed and was complete and comprehensive, except as stated in the above HPI.  OBGYN History: As per HPI. OB History    Gravida  3   Para  2   Term  2   Preterm      AB      Living  2     SAB      IAB      Ectopic      Multiple  0   Live Births  2            Past Medical History: Past Medical History:  Diagnosis Date  . ADHD (attention deficit hyperactivity disorder)   . Depression   . Preeclampsia, severe, third trimester    HELLP Syndrome    Past Surgical History: Past Surgical History:  Procedure Laterality Date  . NO PAST SURGERIES      Family History:  Family History  Problem Relation Age of Onset  . SIDS Son    She denies any female cancers, bleeding or blood clotting disorders.   Social History:  Social History   Socioeconomic History  . Marital status: Single    Spouse name: Not on file  . Number of children: 0  . Years of education: Not on file  . Highest education level: Not on file  Occupational History  . Not on file  Tobacco Use  . Smoking status: Current Every Day Smoker    Packs/day: 0.50    Types: Cigarettes  . Smokeless tobacco: Never Used  Vaping Use  . Vaping Use: Never used  Substance and Sexual Activity  . Alcohol use: Not Currently    Comment: vodka  . Drug use: Yes    Frequency: 1.0 times per week    Types: Marijuana  . Sexual  activity: Yes  Other Topics Concern  . Not on file  Social History Narrative  . Not on file   Social Determinants of Health   Financial Resource Strain: Not on file  Food Insecurity: Not on file  Transportation Needs: Not on file  Physical Activity: Not on file  Stress: Not on file  Social Connections: Not on file  Intimate Partner Violence: Not on file    Allergy: No Known Allergies  Current Outpatient Medications: (Not in a hospital admission)   Hospital Medications: Current Facility-Administered Medications  Medication Dose Route Frequency Provider Last Rate Last Admin  . fentaNYL (SUBLIMAZE) injection 50 mcg  50 mcg Intravenous Q1H PRN Willy Eddy, MD   50 mcg at 05/14/20 1219   Current Outpatient Medications  Medication Sig Dispense Refill  . amoxicillin (AMOXIL) 875 MG tablet Take 1 tablet (875 mg total) by mouth 2 (two) times daily. 14 tablet 0  . LESSINA-28 0.1-20 MG-MCG tablet Take 1 tablet by mouth daily. (Patient not taking: Reported on 04/25/2020)    . Prenat-FeFum-FePo-FA-Omega 3 (CONCEPT DHA) 53.5-38-1 MG CAPS Take 1 capsule by mouth daily. 60 capsule  3    Physical Exam: Vitals:   05/14/20 1300 05/14/20 1330 05/14/20 1400 05/14/20 1413  BP:  (!) 105/54 (!) 103/48 (!) 94/57  Pulse: (!) 101     Resp: (!) 24 13 (!) 22 17  Temp:      TempSrc:      SpO2: 100%     Weight:      Height:        Temp:  [97.7 F (36.5 C)] 97.7 F (36.5 C) (01/03 1118) Pulse Rate:  [91-110] 101 (01/03 1300) Resp:  [11-25] 17 (01/03 1413) BP: (80-105)/(43-82) 94/57 (01/03 1413) SpO2:  [98 %-100 %] 100 % (01/03 1300) Weight:  [93 kg] 93 kg (01/03 1119) No intake/output data recorded. No intake/output data recorded. No intake or output data in the 24 hours ending 05/14/20 1423  Body mass index is 34.11 kg/m. Constitutional: Well nourished, well developed female in no acute distress.  HEENT: normal Neck:  Supple, normal appearance, and no thyromegaly   Cardiovascular:Regular rate and rhythm.  No murmurs, rubs or gallops. Respiratory:  Clear to auscultation bilateral. Normal respiratory effort Abdomen: positive bowel sounds and no masses, hernias; diffusely non tender to palpation, non distended Neuro: grossly intact Psych:  Normal mood and affect.  Skin:  Warm and dry.  MS: normal gait and normal bilateral lower extremity strength/ROM/symmetry Lymphatic:  No inguinal lymphadenopathy.   Pelvic exam: is not limited by body habitus EGBUS: within normal limits Vagina: within normal limits. Bladder and Urethra: normal. Cervix: Not dilated, no tissue, min blood noted Uterus:  normal size, contour, position, consistency, mobility, non-tender Adnexa: normal adnexa and no mass, fullness, tenderness  Recent Labs  Lab 05/12/20 2115 05/14/20 1125  WBC 11.3* 16.8*  HGB 12.6 12.2  HCT 37.2 36.1  PLT 271 328   Recent Labs  Lab 05/12/20 2115 05/14/20 1125  NA 136 136  K 3.7 3.8  CL 103 106  CO2 23 22  BUN 11 14  CREATININE 0.59 0.78  CALCIUM 9.0 9.0  PROT 7.4 7.2  BILITOT 0.9 0.4  ALKPHOS 93 94  ALT 20 23  AST 17 24  GLUCOSE 88 118*   No results for input(s): APTT, INR, PTT in the last 168 hours.  Invalid input(s): DRHAPTT  Imaging:  Ultrasound independently reviewed/interpreted by self.  Assessment: [name] is a 25 y.o. [phone] (Patient's last menstrual period was 02/28/2020.) who presented to the ED with complaints of first trimester bleeding; findings are consistent with miscarriage.  Plan: Likely Complete Abortion, spontaneous [place] to assess for retained POC Methergine to aid in bleeding 24 hours Option for D&C if persistent bleeding or evidence for POC on [place] CBC stable F/u office 1 week  Annamarie Major, MD, [facility], [facility] 05/14/2020  2:23 PM Pager [phone]

## 2020-05-14 NOTE — ED Notes (Signed)
MD at bedside for pelvic exam, female NT to assist

## 2020-05-14 NOTE — ED Notes (Signed)
Pt reports saturating multiple pads at home, using several at a time. Bleeding through last night. Reports dizziness, lightheaded, multiple LOC's last night and this AM, especially when standing. Pt did have one brief LOC while being tranported from triage to a room.  At this time pt is alert and oriented x4, denies headache. Is still dizzy. Pt appears pale, skin cool. Appears to have been diaphoretic, is not at this time.

## 2020-05-14 NOTE — ED Provider Notes (Signed)
[facility] Emergency Department Provider Note    Event Date/Time   First MD Initiated Contact with Patient 05/14/20 1119     (approximate)  I have reviewed the triage vital signs and the nursing notes.   HISTORY  Chief Complaint Vaginal Bleeding and Loss of Consciousness    HPI Susan [name] is a 24 y.o. female G3 P2 recent diagnosis of miscarriage presents to the ER for evaluation of syncopal episode with worsening vaginal bleeding and pain. States that she has been soaking through 3-4 pads per hour throughout the night. Had multiple episodes where she fainted and passed out. Is having some cramping pain. Not any blood thinners. Patient arrives in triage hypotensive pale ill-appearing.    Past Medical History:  Diagnosis Date  . ADHD (attention deficit hyperactivity disorder)   . Depression   . Preeclampsia, severe, third trimester    HELLP Syndrome   Family History  Problem Relation Age of Onset  . SIDS Son    Past Surgical History:  Procedure Laterality Date  . NO PAST SURGERIES     Patient Active Problem List   Diagnosis Date Noted  . Obesity affecting pregnancy in first trimester 04/25/2020  . MDD (major depressive disorder), recurrent episode, severe ([place]) 12/02/2019  . Tobacco use complicating pregnancy, first trimester 08/21/2017  . Family history of SIDS (sudden infant death syndrome) 05-25-17  . Supervision of high risk pregnancy, antepartum 04/06/2017  . History of pre-eclampsia 04/06/2017  . Marijuana abuse 12/07/2016  . Abnormal liver function tests 09/18/2016  . Chronic headaches 09/18/2016      Prior to Admission medications   Medication Sig Start Date End Date Taking? Authorizing Provider  methylergonovine (METHERGINE) 0.2 MG tablet Take 1 tablet (0.2 mg total) by mouth every 6 (six) hours for 3 doses. 05/14/20 05/15/20 Yes Merlyn Lot, MD  amoxicillin (AMOXIL) 875 MG tablet Take 1 tablet (875 mg total) by mouth 2  (two) times daily. 05/10/20   Cuthriell, Charline Bills, PA-C  LESSINA-28 0.1-20 MG-MCG tablet Take 1 tablet by mouth daily. Patient not taking: Reported on 04/25/2020 08/25/19   [provider]  Prenat-FeFum-FePo-FA-Omega 3 (CONCEPT DHA) 53.5-38-1 MG CAPS Take 1 capsule by mouth daily. 04/25/20 05/25/20  Orlie Pollen, CNM    Allergies Patient has no known allergies.    Social History Social History   Tobacco Use  . Smoking status: Current Every Day Smoker    Packs/day: 0.50    Types: Cigarettes  . Smokeless tobacco: Never Used  Vaping Use  . Vaping Use: Never used  Substance Use Topics  . Alcohol use: Not Currently    Comment: vodka  . Drug use: Yes    Frequency: 1.0 times per week    Types: Marijuana    Review of Systems Patient denies headaches, rhinorrhea, blurry vision, numbness, shortness of breath, chest pain, edema, cough, abdominal pain, nausea, vomiting, diarrhea, dysuria, fevers, rashes or hallucinations unless otherwise stated above in HPI. ____________________________________________   PHYSICAL EXAM:  VITAL SIGNS: Vitals:   05/14/20 1400 05/14/20 1413  BP: (!) 103/48 (!) 94/57  Pulse:    Resp: (!) 22 17  Temp:    SpO2:      Constitutional: Alert, ill appearing  Eyes: Conjunctivae are normal.  Head: Atraumatic. Nose: No congestion/rhinnorhea. Mouth/Throat: Mucous membranes are moist.   Neck: No stridor. Painless ROM.  Cardiovascular: Normal rate, regular rhythm. Grossly normal heart sounds.  Good peripheral circulation. Respiratory: Normal respiratory effort.  No retractions. Lungs CTAB.  Gastrointestinal: Soft and nontender. No distention. No abdominal bruits. No CVA tenderness. Genitourinary: Proximal conception in the vaginal vault with an open cervical os. Postresection removed from cervical os with decrease in bleeding but still some slow ooze. Musculoskeletal: No lower extremity tenderness nor edema.  No joint effusions. Neurologic:   Normal speech and language. No gross focal neurologic deficits are appreciated. No facial droop Skin:  Skin is warm, dry and intact. No rash noted. Psychiatric: Mood and affect are normal. Speech and behavior are normal.  ____________________________________________   LABS (all labs ordered are listed, but only abnormal results are displayed)  Results for orders placed or performed during the hospital encounter of 05/14/20 (from the past 24 hour(s))  Type and screen [facility]     Status: None   Collection Time: 05/14/20 11:25 AM  Result Value Ref Range   ABO/RH(D) O POS    Antibody Screen NEG    Sample Expiration      05/17/2020,2359 Performed at [facility], [address] [place], [place] [address]   CBC with Differential     Status: Abnormal   Collection Time: 05/14/20 11:25 AM  Result Value Ref Range   WBC 16.8 (H) 4.0 - 10.5 K/uL   RBC 3.81 (L) 3.87 - 5.11 MIL/uL   Hemoglobin 12.2 12.0 - 15.0 g/dL   HCT 23.7 62.8 - 31.5 %   MCV 94.8 80.0 - 100.0 fL   MCH 32.0 26.0 - 34.0 pg   MCHC 33.8 30.0 - 36.0 g/dL   RDW 17.6 16.0 - 73.7 %   Platelets 328 150 - 400 K/uL   nRBC 0.0 0.0 - 0.2 %   Neutrophils Relative % 66 %   Neutro Abs 11.4 (H) 1.7 - 7.7 K/uL   Lymphocytes Relative 24 %   Lymphs Abs 4.0 0.7 - 4.0 K/uL   Monocytes Relative 6 %   Monocytes Absolute 0.9 0.1 - 1.0 K/uL   Eosinophils Relative 2 %   Eosinophils Absolute 0.3 0.0 - 0.5 K/uL   Basophils Relative 1 %   Basophils Absolute 0.1 0.0 - 0.1 K/uL   Immature Granulocytes 1 %   Abs Immature Granulocytes 0.08 (H) 0.00 - 0.07 K/uL  Comprehensive metabolic panel     Status: Abnormal   Collection Time: 05/14/20 11:25 AM  Result Value Ref Range   Sodium 136 135 - 145 mmol/L   Potassium 3.8 3.5 - 5.1 mmol/L   Chloride 106 98 - 111 mmol/L   CO2 22 22 - 32 mmol/L   Glucose, Bld 118 (H) 70 - 99 mg/dL   BUN 14 6 - 20 mg/dL   Creatinine, Ser 1.06 0.44 - 1.00 mg/dL   Calcium 9.0 8.9 -  26.9 mg/dL   Total Protein 7.2 6.5 - 8.1 g/dL   Albumin 3.7 3.5 - 5.0 g/dL   AST 24 15 - 41 U/L   ALT 23 0 - 44 U/L   Alkaline Phosphatase 94 38 - 126 U/L   Total Bilirubin 0.4 0.3 - 1.2 mg/dL   GFR, Estimated >48 >54 mL/min   Anion gap 8 5 - 15  hCG, quantitative, pregnancy     Status: Abnormal   Collection Time: 05/14/20 11:25 AM  Result Value Ref Range   hCG, Beta Chain, Quant, S 6,457 (H) <5 mIU/mL  Resp Panel by RT-PCR (Flu A&B, Covid) Nasopharyngeal Swab     Status: None   Collection Time: 05/14/20 12:25 PM   Specimen: Nasopharyngeal Swab; Nasopharyngeal(NP) swabs  in vial transport medium  Result Value Ref Range   SARS Coronavirus 2 by RT PCR NEGATIVE NEGATIVE   Influenza A by PCR NEGATIVE NEGATIVE   Influenza B by PCR NEGATIVE NEGATIVE   ____________________________________________ ____________________________________________  RADIOLOGY   ____________________________________________   PROCEDURES  Procedure(s) performed:  Procedures    Critical Care performed: no ____________________________________________   INITIAL IMPRESSION / ASSESSMENT AND PLAN / ED COURSE  Pertinent labs & imaging results that were available during my care of the patient were reviewed by me and considered in my medical decision making (see chart for details).   DDX: ectopic, incomplete ab, laceration, abruption  Susan [name] is a 25 y.o. who presents to the ED with presentation as described above. Patient is ill upon arrival hypertensive systolics in the 80s after syncopal episodes with evidence of vaginal bleeding. Pelvic exam with large volume blood in the vaginal vault with blood in the cervical os which was removed with ring forceps. Still having slow ooze but no brisk bleeding at this moment. Will consult OB/GYN. Patient given IV fluids with improvement pressure. Clinical Course as of 05/14/20 1522  Mon May 14, 2020  1155 Hemodynamics improving.  Hemoglobin 12.2.  Discussed case  in consultation with Dr. Tiburcio Pea of OB/GYN who will evaluate patient at bedside to see if cervical office is closing and is completed miscarriage.  Has recommended Methergine 0.2. [PR]  1205 hCG, quantitative, pregnancy [PR]  1314 Patient has been evaluated by Dr. Tiburcio Pea at bedside.  On repeat exam cervix is now closed.  Has recommended ultrasound.  I will observe the patient for repeat hemoglobin. [PR]  1520 Ultrasound is reassuring without any evidence of retained products conception.  Patient feels much improved.  States that the bleeding has subsided.  Will p.o. challenge and observe for repeat hemoglobin.  Patient will be started on Methergine per Dr. Johnathan Hausen recommendations will need for outpatient follow-up. [PR]    Clinical Course User Index [PR] Willy Eddy, MD    The patient was evaluated in Emergency Department today for the symptoms described in the history of present illness. He/she was evaluated in the context of the global COVID-19 pandemic, which necessitated consideration that the patient might be at risk for infection with the SARS-CoV-2 virus that causes COVID-19. Institutional protocols and algorithms that pertain to the evaluation of patients at risk for COVID-19 are in a state of rapid change based on information released by regulatory bodies including the CDC and federal and state organizations. These policies and algorithms were followed during the patient's care in the ED.  As part of my medical decision making, I reviewed the following data within the electronic MEDICAL RECORD NUMBER Nursing notes reviewed and incorporated, Labs reviewed, notes from prior ED visits and [place] Controlled Substance Database   ____________________________________________   FINAL CLINICAL IMPRESSION(S) / ED DIAGNOSES  Final diagnoses:  Vaginal bleeding  Miscarriage      NEW MEDICATIONS STARTED DURING THIS VISIT:  New Prescriptions   METHYLERGONOVINE (METHERGINE) 0.2 MG TABLET    Take 1  tablet (0.2 mg total) by mouth every 6 (six) hours for 3 doses.     Note:  This document was prepared using Dragon voice recognition software and may include unintentional dictation errors.    Willy Eddy, MD 05/14/20 [phone]

## 2020-05-14 NOTE — ED Triage Notes (Signed)
Pt was seen here yesterday for miscarriage, states she began having severe vaginal bleeding last and passed out multiple times, pt has a syncopal episode during triage, with systolic pressure 47.

## 2020-05-14 NOTE — ED Provider Notes (Signed)
Emergency department handoff note  Care of this patient was signed out to me at the end of the previous provider shift pending a repeat of her H&H to make sure that she does not require transfusion.  Repeat hemoglobin 9.5 and hematocrit 28.6.  Patient states that she continues to feel better than when she arrived.  The patient has been reexamined and is ready to be discharged.  All diagnostic results have been reviewed and discussed with the patient/family.  Care plan has been outlined and the patient/family understands all current diagnoses, results, and treatment plans.  There are no new complaints, changes, or physical findings at this time.  All questions have been addressed and answered.  All medications, if any, that were given while in the emergency department or any that are being prescribed have been reviewed with the patient/family.  All side effects and adverse reactions have been explained.  Patient was instructed to, and agrees to follow-up with their primary care physician as well as return to the emergency department if any new or worsening symptoms develop.   Merwyn Katos, MD 05/14/20 [phone]

## 2020-05-14 NOTE — ED Notes (Signed)
Called pharmacy regarding missing dose. Per pharmacy they are sending now

## 2020-05-14 NOTE — ED Notes (Signed)
US at bedside

## 2020-05-22 ENCOUNTER — Encounter: Payer: Medicaid Other | Admitting: Obstetrics & Gynecology

## 2020-05-22 ENCOUNTER — Ambulatory Visit: Payer: Medicaid Other

## 2020-10-22 ENCOUNTER — Emergency Department
Admission: EM | Admit: 2020-10-22 | Discharge: 2020-10-22 | Disposition: A | Discharge status: Home / Self Care | Payer: Medicaid Other | Attending: Emergency Medicine | Admitting: Emergency Medicine

## 2020-10-22 ENCOUNTER — Other Ambulatory Visit: Payer: Self-pay

## 2020-10-22 ENCOUNTER — Encounter: Payer: Self-pay | Admitting: Emergency Medicine

## 2020-10-22 DIAGNOSIS — U071 COVID-19: Secondary | ICD-10-CM | POA: Diagnosis not present

## 2020-10-22 DIAGNOSIS — Z3A00 Weeks of gestation of pregnancy not specified: Secondary | ICD-10-CM | POA: Insufficient documentation

## 2020-10-22 DIAGNOSIS — O98511 Other viral diseases complicating pregnancy, first trimester: Secondary | ICD-10-CM | POA: Insufficient documentation

## 2020-10-22 DIAGNOSIS — O99331 Smoking (tobacco) complicating pregnancy, first trimester: Secondary | ICD-10-CM | POA: Diagnosis not present

## 2020-10-22 DIAGNOSIS — F17210 Nicotine dependence, cigarettes, uncomplicated: Secondary | ICD-10-CM | POA: Insufficient documentation

## 2020-10-22 DIAGNOSIS — R000 Tachycardia, unspecified: Secondary | ICD-10-CM | POA: Insufficient documentation

## 2020-10-22 LAB — URINALYSIS, COMPLETE (UACMP) WITH MICROSCOPIC
Bilirubin Urine: NEGATIVE
Glucose, UA: NEGATIVE mg/dL
Hgb urine dipstick: NEGATIVE
Ketones, ur: NEGATIVE mg/dL
Nitrite: NEGATIVE
Protein, ur: NEGATIVE mg/dL
Specific Gravity, Urine: 1.026 (ref 1.005–1.030)
pH: 5 (ref 5.0–8.0)

## 2020-10-22 LAB — BASIC METABOLIC PANEL
Anion gap: 8 (ref 5–15)
BUN: 11 mg/dL (ref 6–20)
CO2: 20 mmol/L — ABNORMAL LOW (ref 22–32)
Calcium: 8.6 mg/dL — ABNORMAL LOW (ref 8.9–10.3)
Chloride: 105 mmol/L (ref 98–111)
Creatinine, Ser: 0.76 mg/dL (ref 0.44–1.00)
GFR, Estimated: >60 mL/min (ref >60)
Glucose, Bld: 138 mg/dL — ABNORMAL HIGH (ref 70–99)
Potassium: 3.6 mmol/L (ref 3.5–5.1)
Sodium: 133 mmol/L — ABNORMAL LOW (ref 135–145)

## 2020-10-22 LAB — HCG, QUANTITATIVE, PREGNANCY: hCG, Beta Chain, Quant, S: 21355 m[IU]/mL — ABNORMAL HIGH (ref <5)

## 2020-10-22 LAB — LIPASE, BLOOD: Lipase: 23 U/L (ref 11–51)

## 2020-10-22 LAB — RESP PANEL BY RT-PCR (FLU A&B, COVID) ARPGX2
Influenza A by PCR: NEGATIVE
Influenza B by PCR: NEGATIVE
SARS Coronavirus 2 by RT PCR: POSITIVE — AB

## 2020-10-22 LAB — CBC
HCT: 38.8 % (ref 36.0–46.0)
Hemoglobin: 12.7 g/dL (ref 12.0–15.0)
MCH: 29.3 pg (ref 26.0–34.0)
MCHC: 32.7 g/dL (ref 30.0–36.0)
MCV: 89.4 fL (ref 80.0–100.0)
Platelets: 241 10*3/uL (ref 150–400)
RBC: 4.34 MIL/uL (ref 3.87–5.11)
RDW: 14.3 % (ref 11.5–15.5)
WBC: 4.8 10*3/uL (ref 4.0–10.5)
nRBC: 0 % (ref 0.0–0.2)

## 2020-10-22 LAB — POC URINE PREG, ED: Preg Test, Ur: POSITIVE — AB

## 2020-10-22 NOTE — ED Triage Notes (Addendum)
Pt via POV from home. Pt c/o dizziness, lower abdominal pain, congestion, fever. Denies taking temp at home. Denies vaginal bleeding. Pt states that she is pregnant but it has not been confirmed by a doctor. Thinks she is 2 months pregnant. Pt is A&Ox4 and NAD. Pt states this would be her 4th pregnancy.

## 2020-10-22 NOTE — ED Provider Notes (Signed)
[facility] Emergency Department Provider Note   ____________________________________________   I have reviewed the triage vital signs and the nursing notes.   HISTORY  Chief Complaint Dizziness   History limited by: Not Limited   HPI Susan [name] is a 25 y.o. female who presents to the emergency department today because of concerns for dizziness.  Patient states she has been having intermittent episodes of dizziness over the past few days.  Sounds like she first noticed that at work when she started to feel lightheaded.  It then has come and gone over the past few days.  She did have an episode earlier today when she was trying to get out of the car.  Additionally the patient states she has had some headaches recently.  She is also had some sore throat.  Additionally she states that she recently found out she was pregnant.  Has been having some abdominal discomfort.    Records reviewed. Per medical record review patient has a history of depression.  Past Medical History:  Diagnosis Date   ADHD (attention deficit hyperactivity disorder)    Depression    Preeclampsia, severe, third trimester    HELLP Syndrome    Patient Active Problem List   Diagnosis Date Noted   Obesity affecting pregnancy in first trimester 04/25/2020   MDD (major depressive disorder), recurrent episode, severe (HCC) 12/02/2019   Tobacco use complicating pregnancy, first trimester 08/21/2017   Family history of SIDS (sudden infant death syndrome) 05/24/17   Supervision of high risk pregnancy, antepartum 04/06/2017   History of pre-eclampsia 04/06/2017   Marijuana abuse 12/07/2016   Abnormal liver function tests 09/18/2016   Chronic headaches 09/18/2016    Past Surgical History:  Procedure Laterality Date   NO PAST SURGERIES      Prior to Admission medications   Medication Sig Start Date End Date Taking? Authorizing Provider  amoxicillin (AMOXIL) 875 MG tablet Take 1  tablet (875 mg total) by mouth 2 (two) times daily. 05/10/20   Cuthriell, Delorise Royals, PA-C  LESSINA-28 0.1-20 MG-MCG tablet Take 1 tablet by mouth daily. Patient not taking: Reported on 04/25/2020 08/25/19   [provider]    Allergies Patient has no known allergies.  Family History  Problem Relation Age of Onset   SIDS Son     Social History Social History   Tobacco Use   Smoking status: Every Day    Packs/day: 0.50    Pack years: 0.00    Types: Cigarettes   Smokeless tobacco: Never  Vaping Use   Vaping Use: Never used  Substance Use Topics   Alcohol use: Not Currently    Comment: vodka   Drug use: Yes    Frequency: 1.0 times per week    Types: Marijuana    Review of Systems Constitutional: Positive for subjective fevers.  Eyes: Positive for vision changes.  ENT: Positive for sore throat. Positive for congestion.  Cardiovascular: Denies chest pain. Respiratory: Denies shortness of breath. Gastrointestinal: Positive for abdominal pain.  Genitourinary: Negative for dysuria. Musculoskeletal: Negative for back pain. Skin: Negative for rash. Neurological: Positive for dizziness.   ____________________________________________   PHYSICAL EXAM:  VITAL SIGNS: ED Triage Vitals  Enc Vitals Group     BP 10/22/20 1838 109/71     Pulse Rate 10/22/20 1838 (!) 111     Resp 10/22/20 1838 18     Temp 10/22/20 1838 99.3 F (37.4 C)     Temp Source 10/22/20 1838 Oral  SpO2 10/22/20 1838 97 %     Weight 10/22/20 1849 200 lb (90.7 kg)     Height 10/22/20 1849 5\' 5"  (1.651 m)     Head Circumference --      Peak Flow --      Pain Score 10/22/20 1849 7   Constitutional: Alert and oriented.  Eyes: Conjunctivae are normal.  ENT      Head: Normocephalic and atraumatic.      Nose: No congestion/rhinnorhea.      Mouth/Throat: Mucous membranes are moist.      Neck: No stridor. Hematological/Lymphatic/Immunilogical: No cervical lymphadenopathy. Cardiovascular:  Tachycardic, regular rhythm.  No murmurs, rubs, or gallops.  Respiratory: Normal respiratory effort without tachypnea nor retractions. Breath sounds are clear and equal bilaterally. No wheezes/rales/rhonchi. Gastrointestinal: Soft and non tender. No rebound. No guarding.  Genitourinary: Deferred Musculoskeletal: Normal range of motion in all extremities. No lower extremity edema. Neurologic:  Normal speech and language. No gross focal neurologic deficits are appreciated.  Skin:  Skin is warm, dry and intact. No rash noted. Psychiatric: Mood and affect are normal. Speech and behavior are normal. Patient exhibits appropriate insight and judgment.  ____________________________________________    LABS (pertinent positives/negatives)  Upreg positive Lipase 23 Hcg 21355 BMP na 133, k 3.6, glu 138, cr 0.76 UA hazy, small leukocytes, 0-5 rbc, 6-10 wbc, 11-20 squamous CBC wbc 4.8, hgb 12.7, plt 241  ____________________________________________   EKG  I, 10/24/20, attending physician, personally viewed and interpreted this EKG  EKG Time: 1846 Rate: 106 Rhythm: sinus tachycardia Axis: right axis deviation Intervals: qtc 454 QRS: narrow, q waves v1 ST changes: no st elevation Impression: abnormal ekg  ____________________________________________    RADIOLOGY  None  ____________________________________________   PROCEDURES  Procedures  ____________________________________________   INITIAL IMPRESSION / ASSESSMENT AND PLAN / ED COURSE  Pertinent labs & imaging results that were available during my care of the patient were reviewed by me and considered in my medical decision making (see chart for details).   Patient presented with various signs and symptoms concerning for viral illness.  She did test positive for COVID here.  I do that this would explain a lot of the patient's symptoms.  She was slightly tachycardic here and I did offer IV fluids however patient  declined.  She stated that she always is tachycardic.  Initially patient was complaining of some abdominal pain and is pregnant.  Bedside ultrasound does show an interuterine gestational sac. Discussed COVID diagnosis with the patient. Recommended close follow up with ob/gyn for further treatment and management.   ____________________________________________   FINAL CLINICAL IMPRESSION(S) / ED DIAGNOSES  Final diagnoses:  COVID-19     Note: This dictation was prepared with Dragon dictation. Any transcriptional errors that result from this process are unintentional     Phineas Semen, MD 10/22/20 2153

## 2020-10-22 NOTE — ED Triage Notes (Signed)
Arrives from [facility] for ED evaluation.  Patient c/o congestion, fever, dizziness.  Patient is 2 months pregnant.  Arrives AAOx3.  Skin warm and dry.  NAD

## 2020-10-22 NOTE — Discharge Instructions (Addendum)
As we discussed please contact your ob/gyn tomorrow to ask about treatment for COVID in the setting of pregnancy. Please seek medical attention for any high fevers, chest pain, shortness of breath, change in behavior, persistent vomiting, bloody stool or any other new or concerning symptoms.

## 2020-10-27 ENCOUNTER — Other Ambulatory Visit: Payer: Self-pay

## 2020-10-27 ENCOUNTER — Encounter: Payer: Self-pay | Admitting: Emergency Medicine

## 2020-10-27 ENCOUNTER — Emergency Department
Admission: EM | Admit: 2020-10-27 | Discharge: 2020-10-27 | Disposition: A | Discharge status: Home / Self Care | Payer: Medicaid Other | Attending: Student in an Organized Health Care Education/Training Program | Admitting: Student in an Organized Health Care Education/Training Program

## 2020-10-27 DIAGNOSIS — O26891 Other specified pregnancy related conditions, first trimester: Secondary | ICD-10-CM | POA: Diagnosis not present

## 2020-10-27 DIAGNOSIS — Z3A01 Less than 8 weeks gestation of pregnancy: Secondary | ICD-10-CM | POA: Diagnosis not present

## 2020-10-27 DIAGNOSIS — B9689 Other specified bacterial agents as the cause of diseases classified elsewhere: Secondary | ICD-10-CM

## 2020-10-27 DIAGNOSIS — F17210 Nicotine dependence, cigarettes, uncomplicated: Secondary | ICD-10-CM | POA: Diagnosis not present

## 2020-10-27 DIAGNOSIS — O2341 Unspecified infection of urinary tract in pregnancy, first trimester: Secondary | ICD-10-CM | POA: Diagnosis not present

## 2020-10-27 DIAGNOSIS — O23591 Infection of other part of genital tract in pregnancy, first trimester: Secondary | ICD-10-CM | POA: Diagnosis not present

## 2020-10-27 DIAGNOSIS — R1030 Lower abdominal pain, unspecified: Secondary | ICD-10-CM | POA: Insufficient documentation

## 2020-10-27 DIAGNOSIS — N760 Acute vaginitis: Secondary | ICD-10-CM

## 2020-10-27 DIAGNOSIS — N390 Urinary tract infection, site not specified: Secondary | ICD-10-CM

## 2020-10-27 DIAGNOSIS — O209 Hemorrhage in early pregnancy, unspecified: Secondary | ICD-10-CM | POA: Diagnosis present

## 2020-10-27 LAB — WET PREP, GENITAL
Sperm: NONE SEEN
Trich, Wet Prep: NONE SEEN
Yeast Wet Prep HPF POC: NONE SEEN

## 2020-10-27 LAB — URINALYSIS, COMPLETE (UACMP) WITH MICROSCOPIC
Bilirubin Urine: NEGATIVE
Glucose, UA: NEGATIVE mg/dL
Ketones, ur: NEGATIVE mg/dL
Nitrite: NEGATIVE
Protein, ur: NEGATIVE mg/dL
Specific Gravity, Urine: 1.023 (ref 1.005–1.030)
WBC, UA: >50 WBC/hpf — ABNORMAL HIGH (ref 0–5)
pH: 6 (ref 5.0–8.0)

## 2020-10-27 LAB — CHLAMYDIA/NGC RT PCR (ARMC ONLY)
Chlamydia Tr: NOT DETECTED
N gonorrhoeae: NOT DETECTED

## 2020-10-27 LAB — POC URINE PREG, ED: Preg Test, Ur: POSITIVE — AB

## 2020-10-27 MED ORDER — CEPHALEXIN 500 MG PO CAPS: 500.0000 mg | ORAL_CAPSULE | Freq: Three times a day (TID) | ORAL | 0 refills | Status: AC

## 2020-10-27 MED ORDER — METRONIDAZOLE 500 MG PO TABS: 500.0000 mg | ORAL_TABLET | Freq: Two times a day (BID) | ORAL | 0 refills | Status: AC

## 2020-10-27 MED ORDER — METRONIDAZOLE 500 MG PO TABS
500.0000 mg | ORAL_TABLET | Freq: Once | ORAL | Status: AC
  Administered 2020-10-27: 500 mg via ORAL
  Filled 2020-10-27: qty 1

## 2020-10-27 MED ORDER — ACETAMINOPHEN 325 MG PO TABS
650.0000 mg | ORAL_TABLET | Freq: Once | ORAL | Status: AC
  Administered 2020-10-27: 650 mg via ORAL
  Filled 2020-10-27: qty 2

## 2020-10-27 MED ORDER — CEPHALEXIN 500 MG PO CAPS
500.0000 mg | ORAL_CAPSULE | Freq: Once | ORAL | Status: AC
  Administered 2020-10-27: 500 mg via ORAL
  Filled 2020-10-27: qty 1

## 2020-10-27 MED ORDER — ONDANSETRON 4 MG PO TBDP
4.0000 mg | ORAL_TABLET | Freq: Once | ORAL | Status: AC
  Administered 2020-10-27: 4 mg via ORAL
  Filled 2020-10-27: qty 1

## 2020-10-27 NOTE — Discharge Instructions (Addendum)
You are being treated for a UTI and BV. Your exam is otherwise normal. Take the two antibiotics as directed. Follow-up with your OB provider for routine prenatal care. Return to the ED if needed.

## 2020-10-27 NOTE — ED Triage Notes (Signed)
Pt reports she is [redacted] weeks pregnant and is having a little pink bloody show. She reports that she is having lower abdomen pain and that she was suppose to go to work today but was afraid that she would have a miscarriage at work

## 2020-10-27 NOTE — ED Notes (Signed)
Patient declined discharge vital signs. 

## 2020-10-29 LAB — URINE CULTURE
Culture: 40000 — AB
Special Requests: NORMAL

## 2020-10-31 NOTE — ED Provider Notes (Signed)
[facility] Emergency Department Provider Note ____________________________________________  Time seen: 1745  I have reviewed the triage vital signs and the nursing notes.  HISTORY  Chief Complaint  Vaginal Bleeding and Abdominal Pain   HPI Susan [name] is a 25 y.o. female G3P2, presents herself to the ED for evaluation of what she describes was a pink bloody show.  Patient reports she reportedly approximately [redacted] weeks pregnant after home pregnancy test.  She notes some lower abdominal pain as well as concern for possible miscarriage.  She denies any heavy bleeding like a period, passing blood clots, or pelvic cramping.  Past Medical History:  Diagnosis Date   ADHD (attention deficit hyperactivity disorder)    Depression    Preeclampsia, severe, third trimester    HELLP Syndrome    Patient Active Problem List   Diagnosis Date Noted   Obesity affecting pregnancy in first trimester 04/25/2020   MDD (major depressive disorder), recurrent episode, severe (HCC) 12/02/2019   Tobacco use complicating pregnancy, first trimester 08/21/2017   Family history of SIDS (sudden infant death syndrome) May 11, 2017   Supervision of high risk pregnancy, antepartum 04/06/2017   History of pre-eclampsia 04/06/2017   Marijuana abuse 12/07/2016   Abnormal liver function tests 09/18/2016   Chronic headaches 09/18/2016    Past Surgical History:  Procedure Laterality Date   NO PAST SURGERIES      Prior to Admission medications   Medication Sig Start Date End Date Taking? Authorizing Provider  cephALEXin (KEFLEX) 500 MG capsule Take 1 capsule (500 mg total) by mouth 3 (three) times daily for 7 days. 10/27/20 11/03/20 Yes Kayci Belleville, Charlesetta Ivory, PA-C  metroNIDAZOLE (FLAGYL) 500 MG tablet Take 1 tablet (500 mg total) by mouth 2 (two) times daily for 7 days. 10/28/20 11/04/20 Yes Adrielle Polakowski, Charlesetta Ivory, PA-C    Allergies Patient has no known allergies.  Family History   Problem Relation Age of Onset   SIDS Son     Social History Social History   Tobacco Use   Smoking status: Every Day    Packs/day: 0.50    Pack years: 0.00    Types: Cigarettes   Smokeless tobacco: Never  Vaping Use   Vaping Use: Never used  Substance Use Topics   Alcohol use: Not Currently    Comment: vodka   Drug use: Yes    Frequency: 1.0 times per week    Types: Marijuana    Review of Systems  Constitutional: Negative for fever. Eyes: Negative for visual changes. ENT: Negative for sore throat. Cardiovascular: Negative for chest pain. Respiratory: Negative for shortness of breath. Gastrointestinal: Negative for abdominal pain, vomiting and diarrhea. Genitourinary: Negative for dysuria.  Reports pink vaginal spotting. Musculoskeletal: Negative for back pain. Skin: Negative for rash. Neurological: Negative for headaches, focal weakness or numbness. ____________________________________________  PHYSICAL EXAM:  VITAL SIGNS: ED Triage Vitals  Enc Vitals Group     BP 10/27/20 1701 119/75     Pulse Rate 10/27/20 1701 (!) 112     Resp 10/27/20 1701 18     Temp 10/27/20 1701 99.1 F (37.3 C)     Temp Source 10/27/20 1701 Oral     SpO2 10/27/20 1701 98 %     Weight 10/27/20 1702 200 lb (90.7 kg)     Height 10/27/20 1702 5\' 5"  (1.651 m)     Head Circumference --      Peak Flow --      Pain Score 10/27/20 1702 7  Pain Loc --      Pain Edu? --      Excl. in GC? --     Constitutional: Alert and oriented. Well appearing and in no distress. Head: Normocephalic and atraumatic. Eyes: Conjunctivae are normal. Normal extraocular movements Cardiovascular: Normal rate, regular rhythm. Normal distal pulses. Respiratory: Normal respiratory effort. No wheezes/rales/rhonchi. GU: normal external genitalia.  No bleeding in the vaginal vault.  Cervix is closed.  No CMT is appreciated. Musculoskeletal: Nontender with normal range of motion in all extremities.  Neurologic:   Normal gait without ataxia. Normal speech and language. No gross focal neurologic deficits are appreciated. Skin:  Skin is warm, dry and intact. No rash noted. Psychiatric: Mood and affect are normal. Patient exhibits appropriate insight and judgment. ____________________________________________   LABS (pertinent positives/negatives)  Labs Reviewed  WET PREP, GENITAL - Abnormal; Notable for the following components:      Result Value   Clue Cells Wet Prep HPF POC PRESENT (*)    WBC, Wet Prep HPF POC MANY (*)    All other components within normal limits  URINE CULTURE - Abnormal; Notable for the following components:   Culture   (*)    Value: 40,000 COLONIES/mL MULTIPLE SPECIES PRESENT, SUGGEST RECOLLECTION   All other components within normal limits  URINALYSIS, COMPLETE (UACMP) WITH MICROSCOPIC - Abnormal; Notable for the following components:   Color, Urine YELLOW (*)    APPearance CLOUDY (*)    Hgb urine dipstick MODERATE (*)    Leukocytes,Ua LARGE (*)    WBC, UA >50 (*)    Bacteria, UA FEW (*)    All other components within normal limits  POC URINE PREG, ED - Abnormal; Notable for the following components:   Preg Test, Ur Positive (*)    All other components within normal limits  CHLAMYDIA/NGC RT PCR ([facility] ONLY)            ____________________________________________  PROCEDURES  Metronidazole 500 mg PO Cephalexin 500 mg PO Zofran 4 mg ODT Acetaminophen 650 mg   Procedures ____________________________________________   INITIAL IMPRESSION / ASSESSMENT AND PLAN / ED COURSE  As part of my medical decision making, I reviewed the following data within the electronic MEDICAL RECORD NUMBER Labs reviewed as above and Notes from prior ED visits     DDX: UTI, vaginitis, cervicitis, early pregnancy demise, menses  Female patient ED evaluation of pink discharge with a recent home pregnancy test.  Patient was evaluated for complaints.  Her urinalysis revealed some moderate  leukocyturia and her wet prep revealed clue cells.  Urine culture is pending at this time.  Patient be treated empirically for BV and an asymptomatic bacteriuria.  She will follow with her selected OB provider or return to the ED if needed.    Susan [name] was evaluated in Emergency Department on 10/31/2020 for the symptoms described in the history of present illness. She was evaluated in the context of the global COVID-19 pandemic, which necessitated consideration that the patient might be at risk for infection with the SARS-CoV-2 virus that causes COVID-19. Institutional protocols and algorithms that pertain to the evaluation of patients at risk for COVID-19 are in a state of rapid change based on information released by regulatory bodies including the CDC and federal and state organizations. These policies and algorithms were followed during the patient's care in the ED. ____________________________________________  FINAL CLINICAL IMPRESSION(S) / ED DIAGNOSES  Final diagnoses:  Lower urinary tract infectious disease  BV (bacterial vaginosis)  Lissa Hoard, PA-C 10/31/20 1304    Willy Eddy, MD 10/31/20 [phone]

## 2020-11-20 ENCOUNTER — Ambulatory Visit (INDEPENDENT_AMBULATORY_CARE_PROVIDER_SITE_OTHER): Payer: Medicaid Other | Admitting: Obstetrics and Gynecology

## 2020-11-20 ENCOUNTER — Encounter: Payer: Self-pay | Admitting: Obstetrics and Gynecology

## 2020-11-20 ENCOUNTER — Other Ambulatory Visit: Payer: Self-pay

## 2020-11-20 VITALS — BP 121/85 | HR 111 | Ht 65.0 in | Wt 230.1 lb

## 2020-11-20 DIAGNOSIS — Z7689 Persons encountering health services in other specified circumstances: Secondary | ICD-10-CM

## 2020-11-20 DIAGNOSIS — Z98891 History of uterine scar from previous surgery: Secondary | ICD-10-CM

## 2020-11-20 DIAGNOSIS — Z3200 Encounter for pregnancy test, result unknown: Secondary | ICD-10-CM | POA: Diagnosis not present

## 2020-11-20 DIAGNOSIS — F410 Panic disorder [episodic paroxysmal anxiety] without agoraphobia: Secondary | ICD-10-CM

## 2020-11-20 MED ORDER — HYDROXYZINE HCL 25 MG PO TABS: 25.0000 mg | ORAL_TABLET | Freq: Four times a day (QID) | ORAL | 0 refills | Status: DC | PRN

## 2020-11-20 NOTE — Progress Notes (Signed)
HPI:      [name] is a 25 y.o. [id number] who LMP was Patient's last menstrual period was 02/28/2020.  Subjective:   She presents today for pregnancy confirmation.  She has no idea of her dating but she thinks she may be very early like 5 weeks.  She states that she has nausea but not a lot of vomiting.  She is not yet taking prenatal vitamins. Her first pregnancy was complicated by cesarean delivery, and her second pregnancy she had a VBAC.  She desires VBAC for this pregnancy.  One of her children died of SIDS. Patient complains of panic attacks that she has 2-3 times per week.    Hx: The following portions of the patient's history were reviewed and updated as appropriate:             She  has a past medical history of ADHD (attention deficit hyperactivity disorder), Depression, and Preeclampsia, severe, third trimester. She does not have any pertinent problems on file. She  has a past surgical history that includes No past surgeries. Her family history includes SIDS in her son. She  reports that she has been smoking cigarettes. She has been smoking an average of 0.50 packs per day. She has never used smokeless tobacco. She reports previous alcohol use. She reports current drug use. Frequency: 1.00 time per week. Drug: Marijuana. She has a current medication list which includes the following prescription(s): hydroxyzine. She has No Known Allergies.       Review of Systems:  Review of Systems  Constitutional: Denied constitutional symptoms, night sweats, recent illness, fatigue, fever, insomnia and weight loss.  Eyes: Denied eye symptoms, eye pain, photophobia, vision change and visual disturbance.  Ears/Nose/Throat/Neck: Denied ear, nose, throat or neck symptoms, hearing loss, nasal discharge, sinus congestion and sore throat.  Cardiovascular: Denied cardiovascular symptoms, arrhythmia, chest pain/pressure, edema, exercise intolerance, orthopnea and palpitations.  Respiratory:  Denied pulmonary symptoms, asthma, pleuritic pain, productive sputum, cough, dyspnea and wheezing.  Gastrointestinal: Denied, gastro-esophageal reflux, melena, nausea and vomiting.  Genitourinary: Denied genitourinary symptoms including symptomatic vaginal discharge, pelvic relaxation issues, and urinary complaints.  Musculoskeletal: Denied musculoskeletal symptoms, stiffness, swelling, muscle weakness and myalgia.  Dermatologic: Denied dermatology symptoms, rash and scar.  Neurologic: Denied neurology symptoms, dizziness, headache, neck pain and syncope.  Psychiatric: Denied psychiatric symptoms, anxiety and depression.  Endocrine: Denied endocrine symptoms including hot flashes and night sweats.   Meds:   No current outpatient medications on file prior to visit.   No current facility-administered medications on file prior to visit.        The pregnancy intention screening data noted above was reviewed. Potential methods of contraception were discussed. The patient elected to proceed with No data recorded.    Objective:     Vitals:   11/20/20 1111  BP: 121/85  Pulse: (!) 111   Filed Weights   11/20/20 1111  Weight: 230 lb 1.6 oz (104.4 kg)              Urinary pregnancy test positive  Assessment:    G3P2002 Patient Active Problem List   Diagnosis Date Noted   Obesity affecting pregnancy in first trimester 04/25/2020   MDD (major depressive disorder), recurrent episode, severe (HCC) 12/02/2019   Tobacco use complicating pregnancy, first trimester 08/21/2017   Family history of SIDS (sudden infant death syndrome) 2017/05/08   Supervision of high risk pregnancy, antepartum 04/06/2017   History of pre-eclampsia 04/06/2017   Marijuana abuse 12/07/2016  Abnormal liver function tests 09/18/2016   Chronic headaches 09/18/2016     1. Possible pregnancy, not confirmed   2. Encounter to establish care   3. Panic attacks   4. History of vaginal birth after cesarean      Unknown dating-very early first trimester likely.     Plan:            Prenatal Plan 1.  The patient was given prenatal literature. 2.  She was begun on prenatal vitamins. 3.  A prenatal lab panel to be drawn at nurse visit. 4.  An ultrasound was ordered to better determine an EDC. 5.  A nurse visit was scheduled. 6.  Genetic testing and testing for other inheritable conditions discussed in detail. She will decide in the future whether to have these labs performed. 7.  A general overview of pregnancy testing, visit schedule, ultrasound schedule, and prenatal care was discussed. 8.  COVID and its risks associated with pregnancy, prevention by limiting exposure and use of masks, as well as the risks and benefits of vaccination during pregnancy were discussed in detail.  [facility] policy regarding office and hospital visitation and testing was explained.  She states she is unwilling to get the COVID-vaccine. 9.  Benefits of breast-feeding discussed in detail including both maternal and infant benefits. Ready Set Baby website discussed. 10.  Patient prescribed Vistaril for panic attacks as a trial I have also recommended counseling and referred her but she states that she does not desire counseling.  She states "people have been trying to get me to go to counseling for years".  Orders Orders Placed This Encounter  Procedures   US OB Comp Less 14 Wks     Meds ordered this encounter  Medications   hydrOXYzine (ATARAX/VISTARIL) 25 MG tablet    Sig: Take 1 tablet (25 mg total) by mouth every 6 (six) hours as needed for itching.    Dispense:  20 tablet    Refill:  0      F/U  Return in about 6 weeks (around 01-31-2021). I spent 31 minutes involved in the care of this patient preparing to see the patient by obtaining and reviewing her medical history (including labs, imaging tests and prior procedures), documenting clinical information in the electronic health record (EHR), counseling and  coordinating care plans, writing and sending prescriptions, ordering tests or procedures and directly communicating with the patient by discussing pertinent items from her history and physical exam as well as detailing my assessment and plan as noted above so that she has an informed understanding.  All of her questions were answered.  Elonda Husky, M.D. 11/20/2020 11:33 AM

## 2020-12-11 ENCOUNTER — Other Ambulatory Visit: Payer: Self-pay

## 2020-12-11 ENCOUNTER — Other Ambulatory Visit: Payer: Self-pay | Admitting: Obstetrics and Gynecology

## 2020-12-11 ENCOUNTER — Ambulatory Visit
Admission: RE | Admit: 2020-12-11 | Discharge: 2020-12-11 | Disposition: A | Discharge status: Home / Self Care | Payer: Medicaid Other | Source: Ambulatory Visit | Attending: Obstetrics and Gynecology | Admitting: Obstetrics and Gynecology

## 2020-12-11 DIAGNOSIS — Z98891 History of uterine scar from previous surgery: Secondary | ICD-10-CM | POA: Diagnosis present

## 2020-12-11 DIAGNOSIS — Z3200 Encounter for pregnancy test, result unknown: Secondary | ICD-10-CM

## 2020-12-18 ENCOUNTER — Ambulatory Visit (INDEPENDENT_AMBULATORY_CARE_PROVIDER_SITE_OTHER): Payer: Medicaid Other | Admitting: Obstetrics and Gynecology

## 2020-12-18 ENCOUNTER — Other Ambulatory Visit: Payer: Self-pay

## 2020-12-18 VITALS — BP 126/76 | HR 118 | Resp 16 | Ht 65.0 in | Wt 235.5 lb

## 2020-12-18 DIAGNOSIS — Z3A14 14 weeks gestation of pregnancy: Secondary | ICD-10-CM | POA: Diagnosis not present

## 2020-12-18 DIAGNOSIS — Z113 Encounter for screening for infections with a predominantly sexual mode of transmission: Secondary | ICD-10-CM

## 2020-12-18 DIAGNOSIS — O99210 Obesity complicating pregnancy, unspecified trimester: Secondary | ICD-10-CM

## 2020-12-18 DIAGNOSIS — Z3402 Encounter for supervision of normal first pregnancy, second trimester: Secondary | ICD-10-CM

## 2020-12-18 MED ORDER — PRENATAL VITAMIN 27-0.8 MG PO TABS: 1.0000 | ORAL_TABLET | Freq: Every day | ORAL | 11 refills | Status: DC

## 2020-12-18 MED ORDER — PROMETHAZINE HCL 25 MG PO TABS
25.0000 mg | ORAL_TABLET | Freq: Four times a day (QID) | ORAL | 1 refills | Status: DC | PRN
Start: 2020-12-18 — End: 2021-04-29

## 2020-12-18 NOTE — Patient Instructions (Signed)
Morning Sickness  Morning sickness is when you feel like you may vomit (feel nauseous) during pregnancy. Sometimes, you may vomit. Morning sickness most often happens in the morning, but it can also happen at any time of the day. Some women may have morning sickness that makes them vomit all the time. This is amore serious problem that needs treatment. What are the causes? The cause of this condition is not known. What increases the risk? You had vomiting or a feeling like you may vomit before your pregnancy. You had morning sickness in another pregnancy. You are pregnant with more than one baby, such as twins. What are the signs or symptoms? Feeling like you may vomit. Vomiting. How is this treated? Treatment is usually not needed for this condition. You may only need to change what you eat. In some cases, your doctor may give you some things to take for your condition. These include: Vitamin B6 supplements. Medicines to treat the feeling that you may vomit. Ginger. Follow these instructions at home: Medicines Take over-the-counter and prescription medicines only as told by your doctor. Do not take any medicines until you talk with your doctor about them first. Take multivitamins before you get pregnant. These can stop or lessen the symptoms of morning sickness. Eating and drinking Eat dry toast or crackers before getting out of bed. Eat 5 or 6 small meals a day. Eat dry and bland foods like rice and baked potatoes. Do not eat greasy, fatty, or spicy foods. Have someone cook for you if the smell of food causes you to vomit or to feel like you may vomit. If you feel like you may vomit after taking prenatal vitamins, take them at night or with a snack. Eat protein foods when you need a snack. Nuts, yogurt, and cheese are good choices. Drink fluids throughout the day. Try ginger ale made with real ginger, ginger tea made from fresh grated ginger, or ginger candies. General  instructions Do not smoke or use any products that contain nicotine or tobacco. If you need help quitting, ask your doctor. Use an air purifier to keep the air in your house free of smells. Get lots of fresh air. Try to avoid smells that make you feel sick. Try wearing an acupressure wristband. This is a wristband that is used to treat seasickness. Try a treatment called acupuncture. In this treatment, a doctor puts needles into certain areas of your body to make you feel better. Contact a doctor if: You need medicine to feel better. You feel dizzy or light-headed. You are losing weight. Get help right away if: The feeling that you may vomit will not go away, or you cannot stop vomiting. You faint. You have very bad pain in your belly. Summary Morning sickness is when you feel like you may vomit (feel nauseous) during pregnancy. You may feel sick in the morning, but you can feel this way at any time of the day. Making some changes to what you eat may help your symptoms go away. This information is not intended to replace advice given to you by your health care provider. Make sure you discuss any questions you have with your healthcare provider. Document Revised: 12/12/2019 Document Reviewed: 11/21/2019 Elsevier Patient Education  2022 [employer]. [email or web address].[email or web address]">  First Trimester of Pregnancy  The first trimester of pregnancy starts on the first day of your last menstrual period until the end of week 12. This is also called months 1 through 3 ofpregnancy. Body changes during  your first trimester Your body goes through many changes during pregnancy. The changes usuallyreturn to normal after your baby is born. Physical changes You may gain or lose weight. Your breasts may grow larger and hurt. The area around your nipples may get darker. Dark spots or blotches may develop on your face. You may have changes in your hair. Health changes You may feel  like you might vomit (nauseous), and you may vomit. You may have heartburn. You may have headaches. You may have trouble pooping (constipation). Your gums may bleed. Other changes You may get tired easily. You may pee (urinate) more often. Your menstrual periods will stop. You may not feel hungry. You may want to eat certain kinds of food. You may have changes in your emotions from day to day. You may have more dreams. Follow these instructions at home: Medicines Take over-the-counter and prescription medicines only as told by your doctor. Some medicines are not safe during pregnancy. Take a prenatal vitamin that contains at least 600 micrograms (mcg) of folic acid. Eating and drinking Eat healthy meals that include: Fresh fruits and vegetables. Whole grains. Good sources of protein, such as meat, eggs, or tofu. Low-fat dairy products. Avoid raw meat and unpasteurized juice, milk, and cheese. If you feel like you may vomit, or you vomit: Eat 4 or 5 small meals a day instead of 3 large meals. Try eating a few soda crackers. Drink liquids between meals instead of during meals. You may need to take these actions to prevent or treat trouble pooping: Drink enough fluids to keep your pee (urine) pale yellow. Eat foods that are high in fiber. These include beans, whole grains, and fresh fruits and vegetables. Limit foods that are high in fat and sugar. These include fried or sweet foods. Activity Exercise only as told by your doctor. Most people can do their usual exercise routine during pregnancy. Stop exercising if you have cramps or pain in your lower belly (abdomen) or low back. Do not exercise if it is too hot or too humid, or if you are in a place of great height (high altitude). Avoid heavy lifting. If you choose to, you may have sex unless your doctor tells you not to. Relieving pain and discomfort Wear a good support bra if your breasts are sore. Rest with your legs raised  (elevated) if you have leg cramps or low back pain. If you have bulging veins (varicose veins) in your legs: Wear support hose as told by your doctor. Raise your feet for 15 minutes, 3-4 times a day. Limit salt in your food. Safety Wear your seat belt at all times when you are in a car. Talk with your doctor if someone is hurting you or yelling at you. Talk with your doctor if you are feeling sad or have thoughts of hurting yourself. Lifestyle Do not use hot tubs, steam rooms, or saunas. Do not douche. Do not use tampons or scented sanitary pads. Do not use herbal medicines, illegal drugs, or medicines that are not approved by your doctor. Do not drink alcohol. Do not smoke or use any products that contain nicotine or tobacco. If you need help quitting, ask your doctor. Avoid cat litter boxes and soil that is used by cats. These carry germs that can cause harm to the baby and can cause a loss of your baby by miscarriage or stillbirth. General instructions Keep all follow-up visits. This is important. Ask for help if you need counseling or if you  need help with nutrition. Your doctor can give you advice or tell you where to go for help. Visit your dentist. At home, brush your teeth with a soft toothbrush. Floss gently. Write down your questions. Take them to your prenatal visits. Where to find more information American Pregnancy Association: americanpregnancy.org [employer]: www.acog.org Office on Women's Health: MightyReward.co.nz Contact a doctor if: You are dizzy. You have a fever. You have mild cramps or pressure in your lower belly. You have a nagging pain in your belly area. You continue to feel like you may vomit, you vomit, or you have watery poop (diarrhea) for 24 hours or longer. You have a bad-smelling fluid coming from your vagina. You have pain when you pee. You are exposed to a disease that spreads from person to person,  such as chickenpox, measles, Zika virus, HIV, or hepatitis. Get help right away if: You have spotting or bleeding from your vagina. You have very bad belly cramping or pain. You have shortness of breath or chest pain. You have any kind of injury, such as from a fall or a car crash. You have new or increased pain, swelling, or redness in an arm or leg. Summary The first trimester of pregnancy starts on the first day of your last menstrual period until the end of week 12 (months 1 through 3). Eat 4 or 5 small meals a day instead of 3 large meals. Do not smoke or use any products that contain nicotine or tobacco. If you need help quitting, ask your doctor. Keep all follow-up visits. This information is not intended to replace advice given to you by your health care provider. Make sure you discuss any questions you have with your healthcare provider. Document Revised: 10/05/2019 Document Reviewed: 08/11/2019 Elsevier Patient Education  [address] [place] [facility]  October 22, 2016  Wednesday 7:00p - 9:00p  [facility] [place], [place]  November 26, 2016  Wednesday 7:00p - 9:00p [facility] [place], [place]    December 31, 2016   Wednesday 7:00p - 9:000p [facility] [place], [place]  January 28, 2017  Wednesday  7:00p - 9:00p [facility] [place], [place]  February 25, 2017 Wednesday 7:00p - 9:00p [facility] [place], [place]  Interested in a waterbirth?  This informational class will help you discover whether waterbirth is the right fit for you.  Education about waterbirth itself, supplies you would need and how to assemble your support team is what you can expect from this class.  Some obstetrical practices require this class in order to pursue a waterbirth.  (Not all obstetrical practices offer waterbirth check with your healthcare provider)  Register only the expectant mom, but you are  encouraged to bring your partner to class!  Fees & Payment No fee  Register Online [email or web address].se Search Waterbirth Commonly Asked Questions During Pregnancy  Cats: A parasite can be excreted in cat feces.  To avoid exposure you need to have another person empty the little box.  If you must empty the litter box you will need to wear gloves.  Wash your hands after handling your cat.  This parasite can also be found in raw or undercooked meat so this should also be avoided.  Colds, Sore Throats, Flu: Please check your medication sheet to see what you can take for symptoms.  If your symptoms are unrelieved by these medications please call the office.  Dental Work: Most any dental work  your dentist recommends is permitted.  X-rays should only be taken during the first trimester if absolutely necessary.  Your abdomen should be shielded with a lead apron during all x-rays.  Please notify your provider prior to receiving any x-rays.  Novocaine is fine; gas is not recommended.  If your dentist requires a note from [place] prior to dental work please call the office and we will provide one for you.  Exercise: Exercise is an important part of staying healthy during your pregnancy.  You may continue most exercises you were accustomed to prior to pregnancy.  Later in your pregnancy you will most likely notice you have difficulty with activities requiring balance like riding a bicycle.  It is important that you listen to your body and avoid activities that put you at a higher risk of falling.  Adequate rest and staying well hydrated are a must!  If you have questions about the safety of specific activities ask your provider.    Exposure to Children with illness: Try to avoid obvious exposure; report any symptoms to [place] when noted,  If you have chicken pos, red measles or mumps, you should be immune to these diseases.   Please do not take any vaccines while pregnant unless you have checked with  your OB provider.  Fetal Movement: After 28 weeks we recommend you do "kick counts" twice daily.  Lie or sit down in a calm quiet environment and count your baby movements "kicks".  You should feel your baby at least 10 times per hour.  If you have not felt 10 kicks within the first hour get up, walk around and have something sweet to eat or drink then repeat for an additional hour.  If count remains less than 10 per hour notify your provider.  Fumigating: Follow your pest control agent's advice as to how long to stay out of your home.  Ventilate the area well before re-entering.  Hemorrhoids:   Most over-the-counter preparations can be used during pregnancy.  Check your medication to see what is safe to use.  It is important to use a stool softener or fiber in your diet and to drink lots of liquids.  If hemorrhoids seem to be getting worse please call the office.   Hot Tubs:  Hot tubs Jacuzzis and saunas are not recommended while pregnant.  These increase your internal body temperature and should be avoided.  Intercourse:  Sexual intercourse is safe during pregnancy as long as you are comfortable, unless otherwise advised by your provider.  Spotting may occur after intercourse; report any bright red bleeding that is heavier than spotting.  Labor:  If you know that you are in labor, please go to the hospital.  If you are unsure, please call the office and let us help you decide what to do.  Lifting, straining, etc:  If your job requires heavy lifting or straining please check with your provider for any limitations.  Generally, you should not lift items heavier than that you can lift simply with your hands and arms (no back muscles)  Painting:  Paint fumes do not harm your pregnancy, but may make you ill and should be avoided if possible.  Latex or water based paints have less odor than oils.  Use adequate ventilation while painting.  Permanents & Hair Color:  Chemicals in hair dyes are not  recommended as they cause increase hair dryness which can increase hair loss during pregnancy.  " Highlighting" and permanents are allowed.  Dye  may be absorbed differently and permanents may not hold as well during pregnancy.  Sunbathing:  Use a sunscreen, as skin burns easily during pregnancy.  Drink plenty of fluids; avoid over heating.  Tanning Beds:  Because their possible side effects are still unknown, tanning beds are not recommended.  Ultrasound Scans:  Routine ultrasounds are performed at approximately 20 weeks.  You will be able to see your baby's general anatomy an if you would like to know the gender this can usually be determined as well.  If it is questionable when you conceived you may also receive an ultrasound early in your pregnancy for dating purposes.  Otherwise ultrasound exams are not routinely performed unless there is a medical necessity.  Although you can request a scan we ask that you pay for it when conducted because insurance does not cover " patient request" scans.  Work: If your pregnancy proceeds without complications you may work until your due date, unless your physician or employer advises otherwise.  Round Ligament Pain/Pelvic Discomfort:  Sharp, shooting pains not associated with bleeding are fairly common, usually occurring in the second trimester of pregnancy.  They tend to be worse when standing up or when you remain standing for long periods of time.  These are the result of pressure of certain pelvic ligaments called "round ligaments".  Rest, Tylenol and heat seem to be the most effective relief.  As the womb and fetus grow, they rise out of the pelvis and the discomfort improves.  Please notify the office if your pain seems different than that described.  It may represent a more serious condition.

## 2020-12-18 NOTE — Progress Notes (Signed)
Myrlene Broker presents for NOB nurse interview visit. Pregnancy confirmation done 11/20/2020, [id number]. Pregnancy education material explained and given. No cats in the home, she is allergic to cats. NOB labs ordered. TSH/HbgA1c due to Increased BMI. Body mass index is 39.19 kg/m.  HIV labs and Drug screen were explained optional and she did not decline. Drug screen ordered. PNV encouraged, I sent a prescription to pharmacy and gave her samples as well. Genetic screening options discussed. Genetic testing: Ordered. Pt may discuss with provider. Call or send results to mychart.  Financial policy not reviewed. FMLA form reviewed and signed. Pt. To follow up with provider in asap for NOB physical, Sam is working on trying to find her a closer appointment with Dr. Logan Bores. She has been having some nausea vomiting. I told her to try the otc recommendations and she said she couldn't afford those medications, so I sent in a prescriptions for phenergan per protocol. She also would like a new prescription for the Hydroxyzine sent in, I told her that I would send you a message. All questions answered.

## 2020-12-19 LAB — URINALYSIS, ROUTINE W REFLEX MICROSCOPIC

## 2020-12-19 LAB — MONITOR DRUG PROFILE 14(MW)
Amphetamine Scrn, Ur: NEGATIVE ng/mL
BARBITURATE SCREEN URINE: NEGATIVE ng/mL
BENZODIAZEPINE SCREEN, URINE: NEGATIVE ng/mL
Buprenorphine, Urine: NEGATIVE ng/mL
CANNABINOIDS UR QL SCN: NEGATIVE ng/mL
Cocaine (Metab) Scrn, Ur: NEGATIVE ng/mL
Creatinine(Crt), U: 115.7 mg/dL (ref 20.0–300.0)
Fentanyl, Urine: NEGATIVE pg/mL
Meperidine Screen, Urine: NEGATIVE ng/mL
Methadone Screen, Urine: NEGATIVE ng/mL
OXYCODONE+OXYMORPHONE UR QL SCN: NEGATIVE ng/mL
Opiate Scrn, Ur: NEGATIVE ng/mL
Ph of Urine: 5.9 (ref 4.5–8.9)
Phencyclidine Qn, Ur: NEGATIVE ng/mL
Propoxyphene Scrn, Ur: NEGATIVE ng/mL
SPECIFIC GRAVITY: 1.025
Tramadol Screen, Urine: NEGATIVE ng/mL

## 2020-12-19 LAB — NICOTINE SCREEN, URINE: Cotinine Ql Scrn, Ur: POSITIVE ng/mL — AB

## 2020-12-20 LAB — VIRAL HEPATITIS HBV, HCV
HCV Ab: <0.1 s/co ratio (ref 0.0–0.9)
Hep B Core Total Ab: NEGATIVE
Hep B Surface Ab, Qual: NONREACTIVE
Hepatitis B Surface Ag: NEGATIVE

## 2020-12-20 LAB — THYROID PANEL WITH TSH
Free Thyroxine Index: 1.3 (ref 1.2–4.9)
T3 Uptake Ratio: 11 % — ABNORMAL LOW (ref 24–39)
T4, Total: 11.7 ug/dL (ref 4.5–12.0)
TSH: 1.54 u[IU]/mL (ref 0.450–4.500)

## 2020-12-20 LAB — CULTURE, OB URINE

## 2020-12-20 LAB — HCV INTERPRETATION

## 2020-12-20 LAB — HEMOGLOBIN A1C
Est. average glucose Bld gHb Est-mCnc: 114 mg/dL
Hgb A1c MFr Bld: 5.6 % (ref 4.8–5.6)

## 2020-12-20 LAB — GC/CHLAMYDIA PROBE AMP
Chlamydia trachomatis, NAA: NEGATIVE
Neisseria Gonorrhoeae by PCR: NEGATIVE

## 2020-12-20 LAB — PARVOVIRUS B19 ANTIBODY, IGG AND IGM
Parvovirus B19 IgG: 0.5 index (ref 0.0–0.8)
Parvovirus B19 IgM: 0.1 index (ref 0.0–0.8)

## 2020-12-20 LAB — HIV ANTIBODY (ROUTINE TESTING W REFLEX): HIV Screen 4th Generation wRfx: NONREACTIVE

## 2020-12-20 LAB — ABO AND RH: Rh Factor: POSITIVE

## 2020-12-20 LAB — RUBELLA SCREEN: Rubella Antibodies, IGG: 2.69 index (ref >0.99)

## 2020-12-20 LAB — URINE CULTURE, OB REFLEX

## 2020-12-20 LAB — ANTIBODY SCREEN: Antibody Screen: NEGATIVE

## 2020-12-20 LAB — RPR: RPR Ser Ql: NONREACTIVE

## 2020-12-20 LAB — VARICELLA ZOSTER ANTIBODY, IGG: Varicella zoster IgG: <135 index — ABNORMAL LOW (ref >165)

## 2020-12-24 ENCOUNTER — Other Ambulatory Visit: Payer: Self-pay

## 2020-12-24 MED ORDER — FLINTSTONES GUMMIES PO CHEW: 1.0000 | CHEWABLE_TABLET | Freq: Every day | ORAL | 3 refills | Status: DC

## 2021-01-01 ENCOUNTER — Encounter: Payer: Self-pay | Admitting: Obstetrics and Gynecology

## 2021-01-01 ENCOUNTER — Other Ambulatory Visit (HOSPITAL_COMMUNITY)
Admission: RE | Admit: 2021-01-01 | Discharge: 2021-01-01 | Disposition: A | Discharge status: Home / Self Care | Payer: Medicaid Other | Source: Ambulatory Visit | Attending: Obstetrics and Gynecology | Admitting: Obstetrics and Gynecology

## 2021-01-01 ENCOUNTER — Other Ambulatory Visit: Payer: Self-pay

## 2021-01-01 ENCOUNTER — Ambulatory Visit (INDEPENDENT_AMBULATORY_CARE_PROVIDER_SITE_OTHER): Payer: Medicaid Other | Admitting: Obstetrics and Gynecology

## 2021-01-01 VITALS — BP 101/69 | HR 123 | Wt 237.2 lb

## 2021-01-01 DIAGNOSIS — Z3402 Encounter for supervision of normal first pregnancy, second trimester: Secondary | ICD-10-CM

## 2021-01-01 DIAGNOSIS — Z124 Encounter for screening for malignant neoplasm of cervix: Secondary | ICD-10-CM | POA: Insufficient documentation

## 2021-01-01 DIAGNOSIS — O99210 Obesity complicating pregnancy, unspecified trimester: Secondary | ICD-10-CM

## 2021-01-01 DIAGNOSIS — Z1379 Encounter for other screening for genetic and chromosomal anomalies: Secondary | ICD-10-CM

## 2021-01-01 DIAGNOSIS — Z3A16 16 weeks gestation of pregnancy: Secondary | ICD-10-CM

## 2021-01-01 DIAGNOSIS — Z98891 History of uterine scar from previous surgery: Secondary | ICD-10-CM

## 2021-01-01 LAB — POCT URINALYSIS DIPSTICK OB
Bilirubin, UA: NEGATIVE
Glucose, UA: NEGATIVE
Ketones, UA: NEGATIVE
Leukocytes, UA: NEGATIVE
Nitrite, UA: NEGATIVE
POC,PROTEIN,UA: NEGATIVE
Spec Grav, UA: 1.025 (ref 1.010–1.025)
Urobilinogen, UA: 0.2 U/dL
pH, UA: 6 (ref 5.0–8.0)

## 2021-01-01 NOTE — Progress Notes (Signed)
NOB Physical: She has back and foot pain. Has no new concerns.

## 2021-01-01 NOTE — Progress Notes (Signed)
NOB: Complains of leg pain from standing at work.  She says she is otherwise doing well.  Taking [employer] prenatal.  Discussed use of 81 mg aspirin daily.  MaterniT 21 and AFP today.  20-week ultrasound ordered.  Pap performed Physical examination General NAD, Conversant  HEENT Atraumatic; Op clear with mmm.  Normo-cephalic. Pupils reactive. Anicteric sclerae  Thyroid/Neck Smooth without nodularity or enlargement. Normal ROM.  Neck Supple.  Skin No rashes, lesions or ulceration. Normal palpated skin turgor. No nodularity.  Breasts: No masses or discharge.  Symmetric.  No axillary adenopathy.  Lungs: Clear to auscultation.No rales or wheezes. Normal Respiratory effort, no retractions.  Heart: NSR.  No murmurs or rubs appreciated. No periferal edema  Abdomen: Soft.  Non-tender.  No masses.  No HSM. No hernia  Extremities: Moves all appropriately.  Normal ROM for age. No lymphadenopathy.  Neuro: Oriented to PPT.  Normal mood. Normal affect.     Pelvic:   Vulva: Normal appearance.  No lesions.  Vagina: No lesions or abnormalities noted.  Support: Normal pelvic support.  Urethra No masses tenderness or scarring.  Meatus Normal size without lesions or prolapse.  Cervix: Normal appearance.  No lesions.  Anus: Normal exam.  No lesions.  Perineum: Normal exam.  No lesions.        Bimanual   Adnexae: No masses.  Non-tender to palpation.  Uterus: Enlarged. Pos FHT's  Non-tender.  Mobile.  AV.  Adnexae: No masses.  Non-tender to palpation.  Cul-de-sac: Negative for abnormality.  Adnexae: No masses.  Non-tender to palpation.         Pelvimetry   Diagonal: Reached.  Spines: Average.  Sacrum: Concave.  Pubic Arch: Normal.

## 2021-01-03 LAB — CYTOLOGY - PAP
Comment: NEGATIVE
Diagnosis: NEGATIVE
High risk HPV: NEGATIVE

## 2021-01-09 ENCOUNTER — Other Ambulatory Visit: Payer: Self-pay

## 2021-01-09 ENCOUNTER — Encounter: Payer: Self-pay | Admitting: Obstetrics and Gynecology

## 2021-01-09 ENCOUNTER — Other Ambulatory Visit: Payer: Medicaid Other

## 2021-01-09 DIAGNOSIS — Z3A17 17 weeks gestation of pregnancy: Secondary | ICD-10-CM

## 2021-01-10 DIAGNOSIS — 419620001 Death: Secondary | SNOMED CT

## 2021-01-10 NOTE — Progress Notes (Signed)
[id number]

## 2021-01-10 DEATH — deceased

## 2021-01-12 LAB — AFP, SERUM, OPEN SPINA BIFIDA
AFP MoM: 1.1
AFP Value: 33.9 ng/mL
Gest. Age on Collection Date: 17.1 weeks
Maternal Age At EDD: 24 yr
OSBR Risk 1 IN: 8933
Test Results:: NEGATIVE
Weight: 237 [lb_av]

## 2021-01-14 LAB — MATERNIT21  PLUS CORE+ESS+SCA, BLOOD

## 2021-01-21 ENCOUNTER — Other Ambulatory Visit: Payer: Self-pay

## 2021-01-21 ENCOUNTER — Ambulatory Visit
Admission: RE | Admit: 2021-01-21 | Discharge: 2021-01-21 | Disposition: A | Discharge status: Home / Self Care | Payer: Medicaid Other | Source: Ambulatory Visit | Attending: Obstetrics and Gynecology | Admitting: Obstetrics and Gynecology

## 2021-01-21 DIAGNOSIS — Z3492 Encounter for supervision of normal pregnancy, unspecified, second trimester: Secondary | ICD-10-CM | POA: Diagnosis present

## 2021-01-21 DIAGNOSIS — Z3A16 16 weeks gestation of pregnancy: Secondary | ICD-10-CM | POA: Diagnosis not present

## 2021-01-23 ENCOUNTER — Other Ambulatory Visit: Payer: Self-pay | Admitting: Obstetrics and Gynecology

## 2021-01-23 DIAGNOSIS — Z3A17 17 weeks gestation of pregnancy: Secondary | ICD-10-CM

## 2021-01-29 ENCOUNTER — Encounter: Payer: Medicaid Other | Admitting: Obstetrics and Gynecology

## 2021-02-01 ENCOUNTER — Other Ambulatory Visit: Payer: Self-pay

## 2021-02-01 ENCOUNTER — Ambulatory Visit (INDEPENDENT_AMBULATORY_CARE_PROVIDER_SITE_OTHER): Payer: Medicaid Other

## 2021-02-01 DIAGNOSIS — Z3A17 17 weeks gestation of pregnancy: Secondary | ICD-10-CM | POA: Diagnosis not present

## 2021-02-06 ENCOUNTER — Encounter: Payer: Medicaid Other | Admitting: Obstetrics and Gynecology

## 2021-02-07 NOTE — Patient Instructions (Addendum)
Influenza (Flu) Vaccine (Inactivated or Recombinant): What You Need to Know 1. Why get vaccinated? Influenza vaccine can prevent influenza (flu). Flu is a contagious disease that spreads around the [place] every year, usually between October and May. Anyone can get the flu, but it is more dangerous for some people. Infants and young children, people 42 years and older, pregnant people, and people with certain health conditions or a weakened immune system are at greatest risk of flu complications. Pneumonia, bronchitis, sinus infections, and ear infections are examples of flu-related complications. If you have a medical condition, such as heart disease, cancer, or diabetes, flu can make it worse. Flu can cause fever and chills, sore throat, muscle aches, fatigue, cough, headache, and runny or stuffy nose. Some people may have vomiting and diarrhea, though this is more common in children than adults. In an average year, thousands of people in the [place] die from flu, and many more are hospitalized. Flu vaccine prevents millions of illnesses and flu-related visits to the doctor each year. 2. Influenza vaccines CDC recommends everyone 6 months and older get vaccinated every flu season. Children 6 months through 44 years of age may need 2 doses during a single flu season. Everyone else needs only 1 dose each flu season. It takes about 2 weeks for protection to develop after vaccination. There are many flu viruses, and they are always changing. Each year a new flu vaccine is made to protect against the influenza viruses believed to be likely to cause disease in the upcoming flu season. Even when the vaccine doesn't exactly match these viruses, it may still provide some protection. Influenza vaccine does not cause flu. Influenza vaccine may be given at the same time as other vaccines. 3. Talk with your health care provider Tell your vaccination provider if the person getting the vaccine: Has had  an allergic reaction after a previous dose of influenza vaccine, or has any severe, life-threatening allergies Has ever had Guillain-Barr Syndrome (also called "GBS") In some cases, your health care provider may decide to postpone influenza vaccination until a future visit. Influenza vaccine can be administered at any time during pregnancy. People who are or will be pregnant during influenza season should receive inactivated influenza vaccine. People with minor illnesses, such as a cold, may be vaccinated. People who are moderately or severely ill should usually wait until they recover before getting influenza vaccine. Your health care provider can give you more information. 4. Risks of a vaccine reaction Soreness, redness, and swelling where the shot is given, fever, muscle aches, and headache can happen after influenza vaccination. There may be a very small increased risk of Guillain-Barr Syndrome (GBS) after inactivated influenza vaccine (the flu shot). Young children who get the flu shot along with pneumococcal vaccine (PCV13) and/or DTaP vaccine at the same time might be slightly more likely to have a seizure caused by fever. Tell your health care provider if a child who is getting flu vaccine has ever had a seizure. People sometimes faint after medical procedures, including vaccination. Tell your provider if you feel dizzy or have vision changes or ringing in the ears. As with any medicine, there is a very remote chance of a vaccine causing a severe allergic reaction, other serious injury, or death. 5. What if there is a serious problem? An allergic reaction could occur after the vaccinated person leaves the clinic. If you see signs of a severe allergic reaction (hives, swelling of the face and throat, difficulty breathing,  a fast heartbeat, dizziness, or weakness), call 9-1-1 and get the person to the nearest hospital. For other signs that concern you, call your health care provider. Adverse  reactions should be reported to the Vaccine Adverse Event Reporting System (VAERS). Your health care provider will usually file this report, or you can do it yourself. Visit the VAERS website at www.vaers.SamedayNews.es or call 240 089 2641. VAERS is only for reporting reactions, and VAERS staff members do not give medical advice. 6. The National Vaccine Injury Compensation Program The [employer] Vaccine Injury Compensation Program (VICP) is a federal program that was created to compensate people who may have been injured by certain vaccines. Claims regarding alleged injury or death due to vaccination have a time limit for filing, which may be as short as two years. Visit the VICP website at GoldCloset.com.ee or call 575-537-3263 to learn about the program and about filing a claim. 7. How can I learn more? Ask your health care provider. Call your local or state health department. Visit the website of the Food and Drug Administration (FDA) for vaccine package inserts and additional information at TraderRating.uy. Contact the Centers for Disease Control and Prevention (CDC): Call 8317178251 (1-800-CDC-INFO) or Visit CDC's website at https://gibson.com/. Vaccine Information Statement Inactivated Influenza Vaccine (12/16/2019) This information is not intended to replace advice given to you by your health care provider. Make sure you discuss any questions you have with your health care provider. Document Revised: 02/02/2020 Document Reviewed: 02/02/2020 Elsevier Patient Education  [place].   Nausea and Vomiting "Morning sickness" can occur at any time during the day and generally goes away by 16 weeks of pregnancy.  Have saltine crackers or pretzels by your bed and eat a few bites before you raise your head out of the bed in the morning.  Eat small frequent meals throughout the day instead of large meals.  Drink plenty of fluids throughout the day to  stay hydrated, just don't drink a lot of fluids with your meals. This can make your stomach fill up faster making you feel sick.  Do not brush your teeth right after you eat.  Products with real ginger are good for nausea, like ginger ale and ginger hard candy. Make sure it says made with real ginger!  Sucking on sour candy like lemon heads is also good for nausea.  If your prenatal vitamins make you nauseated, take them at night so you will sleep through the nausea.  Sea bands  You can try over the counter Vitamin B6 (pyridoxine) 25 mg four times a day OR 50 mg twice a day. Add Unsiom (doxylamine) 12.5 mg (1/2 tab) in the morning, 12.$RemoveBeforeD'5mg'[id number]$  6-8 hours later, then 25 mg every night if symptoms do not improve with the Vitamin B6 alone.  If you feel like you need prescription medicine for nausea and vomiting please let us know.  If you are unable to keep any fluids or food down please let us know.  If you are vomiting more than 6 times a day please let us know.  If you are showing any signs of dehydration-dry cracked lips, decreased urination, dizziness please let us know.  Severe headaches that don't go away with Tylenol.  Visual changes (seeing spots, double, blurred vision)  Hope this information is helpful. If you have any problems or concerns please feel free to contact the office at [phone] or 24/7 by mychart.    Reminder mychart messages are only answered during office business hours Monday-Friday 8am-5pm.  If it is an emergency please contact the office or go the Labor and Delivery.   Thanks Elie Gragert   Common Medications Safe in Pregnancy  Acne:      Constipation:  Benzoyl Peroxide     Colace  Clindamycin      Dulcolax Suppository  Topica Erythromycin     Fibercon  Salicylic Acid      Metamucil         Miralax AVOID:        Senakot   Accutane    Cough:  Retin-A       Cough Drops  Tetracycline      Phenergan w/ Codeine if Rx  Minocycline      Robitussin (Plain &  DM)  Antibiotics:     Crabs/Lice:  Ceclor       RID  Cephalosporins    AVOID:  E-Mycins      Kwell  Keflex  Macrobid/Macrodantin   Diarrhea:  Penicillin      Kao-Pectate  Zithromax      Imodium AD         PUSH FLUIDS AVOID:       Cipro     Fever:  Tetracycline      Tylenol (Regular or Extra  Minocycline       Strength)  Levaquin      Extra Strength-Do not          Exceed 8 tabs/24 hrs Caffeine:        '200mg'$ /day (equiv. To 1 cup of coffee or  approx. 3 12 oz sodas)         Gas: Cold/Hayfever:       Gas-X  Benadryl      Mylicon  Claritin       Phazyme  **Claritin-D        Chlor-Trimeton    Headaches:  Dimetapp      ASA-Free Excedrin  Drixoral-Non-Drowsy     Cold Compress  Mucinex (Guaifenasin)     Tylenol (Regular or Extra  Sudafed/Sudafed-12 Hour     Strength)  **Sudafed PE Pseudoephedrine   Tylenol Cold & Sinus     Vicks Vapor Rub  Zyrtec  **AVOID if Problems With Blood Pressure         Heartburn: Avoid lying down for at least 1 hour after meals  Aciphex      Maalox     Rash:  Milk of Magnesia     Benadryl    Mylanta       1% Hydrocortisone Cream  Pepcid  Pepcid Complete   Sleep Aids:  Prevacid      Ambien   Prilosec       Benadryl  Rolaids       Chamomile Tea  Tums (Limit 4/day)     Unisom         Tylenol PM         Warm milk-add vanilla or  Hemorrhoids:       Sugar for taste  Anusol/Anusol H.C.  (RX: Analapram 2.5%)  Sugar Substitutes:  Hydrocortisone OTC     Ok in moderation  Preparation H      Tucks        Vaseline lotion applied to tissue with wiping    Herpes:     Throat:  Acyclovir      Oragel  Famvir  Valtrex     Vaccines:         Flu Shot Leg Cramps:       *Gardasil  Benadryl      Hepatitis A         Hepatitis B Nasal Spray:       Pneumovax  Saline Nasal Spray     Polio Booster         Tetanus Nausea:       Tuberculosis test or PPD  Vitamin B6 25 mg TID   AVOID:    Dramamine      *Gardasil  Emetrol       Live Poliovirus  Ginger  Root 250 mg QID    MMR (measles, mumps &  High Complex Carbs @ Bedtime    rebella)  Sea Bands-Accupressure    Varicella (Chickenpox)  Unisom 1/2 tab TID     *No known complications           If received before Pain:         Known pregnancy;   Darvocet       Resume series after  Lortab        Delivery  Percocet    Yeast:   Tramadol      Femstat  Tylenol 3      Gyne-lotrimin  Ultram       Monistat  Vicodin           MISC:         All Sunscreens           Hair Coloring/highlights          Insect Repellant's          (Including DEET)         Mystic Tans Morning Sickness Morning sickness is when you feel like you may vomit (feel nauseous) during pregnancy. Sometimes, you may vomit. Morning sickness most often happens in the morning, but it can also happen at any time of the day. Some women may have morning sickness that makes them vomit all the time. This is a more serious problem that needs treatment. What are the causes? The cause of this condition is not known. What increases the risk? You had vomiting or a feeling like you may vomit before your pregnancy. You had morning sickness in another pregnancy. You are pregnant with more than one baby, such as twins. What are the signs or symptoms? Feeling like you may vomit. Vomiting. How is this treated? Treatment is usually not needed for this condition. You may only need to change what you eat. In some cases, your doctor may give you some things to take for your condition. These include: Vitamin B6 supplements. Medicines to treat the feeling that you may vomit. Ginger. Follow these instructions at home: Medicines Take over-the-counter and prescription medicines only as told by your doctor. Do not take any medicines until you talk with your doctor about them first. Take multivitamins before you get pregnant. These can stop or lessen the symptoms of morning sickness. Eating and drinking Eat dry toast or crackers before getting out of  bed. Eat 5 or 6 small meals a day. Eat dry and bland foods like rice and baked potatoes. Do not eat greasy, fatty, or spicy foods. Have someone cook for you if the smell of food causes you to vomit or to feel like you may vomit. If you feel like you may vomit after taking prenatal vitamins, take them at night or with a snack. Eat protein foods when you need a snack. Nuts, yogurt, and cheese are good choices. Drink fluids throughout the day. Try ginger ale made with real  ginger, ginger tea made from fresh grated ginger, or ginger candies. General instructions Do not smoke or use any products that contain nicotine or tobacco. If you need help quitting, ask your doctor. Use an air purifier to keep the air in your house free of smells. Get lots of fresh air. Try to avoid smells that make you feel sick. Try wearing an acupressure wristband. This is a wristband that is used to treat seasickness. Try a treatment called acupuncture. In this treatment, a doctor puts needles into certain areas of your body to make you feel better. Contact a doctor if: You need medicine to feel better. You feel dizzy or light-headed. You are losing weight. Get help right away if: The feeling that you may vomit will not go away, or you cannot stop vomiting. You faint. You have very bad pain in your belly. Summary Morning sickness is when you feel like you may vomit (feel nauseous) during pregnancy. You may feel sick in the morning, but you can feel this way at any time of the day. Making some changes to what you eat may help your symptoms go away. This information is not intended to replace advice given to you by your health care provider. Make sure you discuss any questions you have with your health care provider. Document Revised: 12/12/2019 Document Reviewed: 11/21/2019 Elsevier Patient Education  2022 [employer].

## 2021-02-08 ENCOUNTER — Other Ambulatory Visit: Payer: Self-pay

## 2021-02-08 ENCOUNTER — Ambulatory Visit (INDEPENDENT_AMBULATORY_CARE_PROVIDER_SITE_OTHER): Payer: Medicaid Other | Admitting: Obstetrics and Gynecology

## 2021-02-08 ENCOUNTER — Encounter: Payer: Self-pay | Admitting: Obstetrics and Gynecology

## 2021-02-08 VITALS — BP 107/73 | HR 109 | Wt 245.7 lb

## 2021-02-08 DIAGNOSIS — Z3A21 21 weeks gestation of pregnancy: Secondary | ICD-10-CM

## 2021-02-08 DIAGNOSIS — O99612 Diseases of the digestive system complicating pregnancy, second trimester: Secondary | ICD-10-CM

## 2021-02-08 DIAGNOSIS — O0992 Supervision of high risk pregnancy, unspecified, second trimester: Secondary | ICD-10-CM

## 2021-02-08 DIAGNOSIS — K219 Gastro-esophageal reflux disease without esophagitis: Secondary | ICD-10-CM

## 2021-02-08 DIAGNOSIS — O34219 Maternal care for unspecified type scar from previous cesarean delivery: Secondary | ICD-10-CM

## 2021-02-08 DIAGNOSIS — O210 Mild hyperemesis gravidarum: Secondary | ICD-10-CM

## 2021-02-08 DIAGNOSIS — O99210 Obesity complicating pregnancy, unspecified trimester: Secondary | ICD-10-CM

## 2021-02-08 LAB — POCT URINALYSIS DIPSTICK OB
Bilirubin, UA: NEGATIVE
Blood, UA: NEGATIVE
Glucose, UA: NEGATIVE
Ketones, UA: NEGATIVE
Leukocytes, UA: NEGATIVE
Nitrite, UA: NEGATIVE
POC,PROTEIN,UA: NEGATIVE
Spec Grav, UA: >=1.03 — AB (ref 1.010–1.025)
Urobilinogen, UA: 0.2 E.U./dL
pH, UA: 6 (ref 5.0–8.0)

## 2021-02-08 MED ORDER — DOXYLAMINE-PYRIDOXINE 10-10 MG PO TBEC: 2.0000 | DELAYED_RELEASE_TABLET | Freq: Every day | ORAL | 5 refills | Status: DC

## 2021-02-08 MED ORDER — FAMOTIDINE 10 MG PO TABS: 10.0000 mg | ORAL_TABLET | Freq: Two times a day (BID) | ORAL | 1 refills | Status: DC

## 2021-02-08 MED ORDER — ASPIRIN EC 81 MG PO TBEC: 81.0000 mg | DELAYED_RELEASE_TABLET | Freq: Every day | ORAL | 2 refills | Status: DC

## 2021-02-08 NOTE — Progress Notes (Signed)
ROB: notes taking Tums for heartburn, works sometimes. Will prescribe Zantac. Also noting still having morning sickness, prescribed Diclegis. Declined flu vaccine today, will get next visit.  Discussed breastfeeding, considering breast and formula feeding due to work schedule. Not taking daily baby aspirin yet as she notes she is on a tight budget and has a difficulty getting rides to the pharmacy. Will give prescription, strongly encouraged to take. F/u anatomy scan normal, now complete. RTC in 4 weeks.

## 2021-02-08 NOTE — Progress Notes (Signed)
   OB-Pt present for routine prenatal care. Pt stated fetal movement present; no contractions present; no vaginal bleeding and no changes in vaginal discharge.     Pt c/o morning sickness and heartburning.  Pt declined flu vaccine at this time stated that she will get the vaccine at her next visit.

## 2021-02-12 ENCOUNTER — Other Ambulatory Visit: Payer: Self-pay

## 2021-02-12 MED ORDER — ONDANSETRON HCL 4 MG PO TABS: 4.0000 mg | ORAL_TABLET | Freq: Three times a day (TID) | ORAL | 0 refills | Status: DC | PRN

## 2021-02-18 ENCOUNTER — Other Ambulatory Visit: Payer: Self-pay

## 2021-02-18 ENCOUNTER — Emergency Department
Admission: EM | Admit: 2021-02-18 | Discharge: 2021-02-18 | Disposition: A | Discharge status: Home / Self Care | Payer: Medicaid Other | Attending: Emergency Medicine | Admitting: Emergency Medicine

## 2021-02-18 DIAGNOSIS — Z7982 Long term (current) use of aspirin: Secondary | ICD-10-CM | POA: Insufficient documentation

## 2021-02-18 DIAGNOSIS — F17210 Nicotine dependence, cigarettes, uncomplicated: Secondary | ICD-10-CM | POA: Diagnosis not present

## 2021-02-18 DIAGNOSIS — J069 Acute upper respiratory infection, unspecified: Secondary | ICD-10-CM | POA: Diagnosis not present

## 2021-02-18 DIAGNOSIS — Z20822 Contact with and (suspected) exposure to covid-19: Secondary | ICD-10-CM | POA: Insufficient documentation

## 2021-02-18 DIAGNOSIS — R059 Cough, unspecified: Secondary | ICD-10-CM | POA: Diagnosis present

## 2021-02-18 LAB — RESP PANEL BY RT-PCR (FLU A&B, COVID) ARPGX2
Influenza A by PCR: NEGATIVE
Influenza B by PCR: NEGATIVE
SARS Coronavirus 2 by RT PCR: NEGATIVE

## 2021-02-18 NOTE — Discharge Instructions (Signed)
You should follow your results on [facility] MyChart. You may safely take OTC Benadryl, Allegra/Claritin/Zyrtec and Tylenol for symptom while pregnant.

## 2021-02-18 NOTE — ED Triage Notes (Signed)
Pt arrived via pov, ambulatory to triage. Pt c/o congestion x 2 days, and nose bleeds on and off for a week. Pt reports being 6 months pregnant. Pt nephew tested positive for RSV. Pt c/o back pain only on left side when taking a deep breath. NAD noted at this time

## 2021-02-18 NOTE — ED Provider Notes (Signed)
[facility] Emergency Department Provider Note ____________________________________________  Time seen: 1644  I have reviewed the triage vital signs and the nursing notes.  HISTORY  Chief Complaint  Nasal Congestion   HPI Susan [name] is a 25 y.o. female at 59 months gestation, presents to the ED for 2 days of cough and congestion.  Patient has had 1 week of off and on nosebleeds.  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  Patient denies any fevers, chills, sweats, chest pain, or shortness of breath.  She denies any pregnancy related complaints at this time.  Past Medical History:  Diagnosis Date   ADHD (attention deficit hyperactivity disorder)    Depression    Preeclampsia, severe, third trimester    HELLP Syndrome    Patient Active Problem List   Diagnosis Date Noted   Desires VBAC (vaginal birth after cesarean) trial 02/08/2021   Obesity affecting pregnancy in first trimester 04/25/2020   MDD (major depressive disorder), recurrent episode, severe (HCC) 12/02/2019   Tobacco use complicating pregnancy, first trimester 08/21/2017   Family history of SIDS (sudden infant death syndrome) May 16, 2017   Supervision of high risk pregnancy, antepartum 04/06/2017   History of pre-eclampsia 04/06/2017   Marijuana abuse 12/07/2016   Abnormal liver function tests 09/18/2016   Chronic headaches 09/18/2016    Past Surgical History:  Procedure Laterality Date   NO PAST SURGERIES      Prior to Admission medications   Medication Sig Start Date End Date Taking? Authorizing Provider  aspirin EC 81 MG tablet Take 1 tablet (81 mg total) by mouth daily. Take after 12 weeks for prevention of preeclampssia later in pregnancy 02/08/21   Hildred Laser, MD  Doxylamine-Pyridoxine (DICLEGIS) 10-10 MG TBEC Take 2 tablets by mouth at bedtime. If  symptoms persist, add one tablet in the morning and one in the afternoon 02/08/21   Hildred Laser, MD  famotidine (ZANTAC 360) 10 MG tablet Take 1 tablet (10 mg total) by mouth 2 (two) times daily. 02/08/21   Hildred Laser, MD  hydrOXYzine (ATARAX/VISTARIL) 25 MG tablet Take 1 tablet (25 mg total) by mouth every 6 (six) hours as needed for itching. 11/20/20   Linzie Collin, MD  ondansetron (ZOFRAN) 4 MG tablet Take 1 tablet (4 mg total) by mouth every 8 (eight) hours as needed for nausea or vomiting. 02/12/21   Hildred Laser, MD  Pediatric Multivit-Minerals-C (FLINTSTONES GUMMIES) chewable tablet Chew 1 tablet by mouth daily. 12/24/20   Linzie Collin, MD  promethazine (PHENERGAN) 25 MG tablet Take 1 tablet (25 mg total) by mouth every 6 (six) hours as needed for nausea or vomiting. 12/18/20   Linzie Collin, MD    Allergies Patient has no known allergies.  Family History  Problem Relation Age of Onset   Diabetes Mother    Diabetes Father    SIDS Son    Diabetes Paternal Aunt     Social History Social History   Tobacco Use   Smoking status: Every Day    Packs/day: 0.50    Types: Cigarettes   Smokeless tobacco: Never  Vaping Use   Vaping Use: Never used  Substance Use Topics   Alcohol use: Not Currently    Comment: vodka   Drug use: Yes    Frequency: 1.0 times per week    Types: Marijuana    Review  of Systems  Constitutional: Negative for fever. Eyes: Negative for visual changes. ENT: Negative for sore throat. Nasal congestion Cardiovascular: Negative for chest pain. Respiratory: Negative for shortness of breath. Gastrointestinal: Negative for abdominal pain, vomiting and diarrhea. Genitourinary: Negative for dysuria. Musculoskeletal: Negative for back pain. Skin: Negative for rash. Neurological: Negative for headaches, focal weakness or numbness. ____________________________________________  PHYSICAL EXAM:  VITAL SIGNS: ED Triage Vitals  Enc Vitals Group      BP 02/18/21 1645 117/67     Pulse Rate 02/18/21 1645 (!) 121     Resp 02/18/21 1645 18     Temp 02/18/21 1645 98.6 F (37 C)     Temp Source 02/18/21 1645 Oral     SpO2 02/18/21 1645 98 %     Weight 02/18/21 1643 240 lb (108.9 kg)     Height 02/18/21 1643 5\' 5"  (1.651 m)     Head Circumference --      Peak Flow --      Pain Score 02/18/21 1642 6     Pain Loc --      Pain Edu? --      Excl. in GC? --     Constitutional: Alert and oriented. Well appearing and in no distress. Head: Normocephalic and atraumatic. Eyes: Conjunctivae are normal. PERRL. Normal extraocular movements Nose: No congestion/rhinorrhea/epistaxis. Mouth/Throat: Mucous membranes are moist. Neck: Supple. No thyromegaly. Hematological/Lymphatic/Immunological: No cervical lymphadenopathy. Cardiovascular: Normal rate, regular rhythm. Normal distal pulses. Respiratory: Normal respiratory effort. No wheezes/rales/rhonchi. Gastrointestinal: Soft and nontender. No distention. Musculoskeletal: Nontender with normal range of motion in all extremities.  Neurologic:  Normal gait without ataxia. Normal speech and language. No gross focal neurologic deficits are appreciated. Skin:  Skin is warm, dry and intact. No rash noted. Psychiatric: Mood and affect are normal. Patient exhibits appropriate insight and judgment. ____________________________________________    {LABS (pertinent positives/negatives)  Labs Reviewed  RESP PANEL BY RT-PCR (FLU A&B, COVID) [id number]   ____________________________________________  {EKG  ____________________________________________   RADIOLOGY Official radiology report(s): No results found. ____________________________________________  PROCEDURES   Procedures ____________________________________________   INITIAL IMPRESSION / ASSESSMENT AND PLAN / ED COURSE  As part of my medical decision making, I reviewed the following data within the electronic MEDICAL RECORD NUMBER Labs  reviewed pending and Notes from prior ED visits   Patient with ED evaluation of some sinus congestion, and a recent RSV exposure, presents to the ED requesting viral testing.  Patient did agree to have COVID/flu labs drawn, will follow results online.  She is advised that due to her pregnancy state, only Tylenol, Benadryl, allergy medicines, nasal steroids are safe pregnancies at this time for symptoms.  Patient is agreeable to the plan, will follow with primary provider.  Patient was provided with a work note as requested.  Precautions have been reviewed.  04/20/21 was evaluated in Emergency Department on 02/18/2021 for the symptoms described in the history of present illness. She was evaluated in the context of the global COVID-19 pandemic, which necessitated consideration that the patient might be at risk for infection with the SARS-CoV-2 virus that causes COVID-19. Institutional protocols and algorithms that pertain to the evaluation of patients at risk for COVID-19 are in a state of rapid change based on information released by regulatory bodies including the CDC and federal and state organizations. These policies and algorithms were followed during the patient's care in the ED. ____________________________________________  FINAL CLINICAL IMPRESSION(S) / ED DIAGNOSES  Final diagnoses:  Viral upper respiratory tract infection  Lissa Hoard, PA-C 02/18/21 2001    Merwyn Katos, MD 02/19/21 2103

## 2021-03-07 ENCOUNTER — Encounter: Payer: Medicaid Other | Admitting: Obstetrics and Gynecology

## 2021-03-18 NOTE — Patient Instructions (Incomplete)

## 2021-03-19 ENCOUNTER — Encounter: Payer: Medicaid Other | Admitting: Obstetrics and Gynecology

## 2021-03-26 ENCOUNTER — Encounter: Payer: Medicaid Other | Admitting: Obstetrics and Gynecology

## 2021-03-26 ENCOUNTER — Other Ambulatory Visit: Payer: Medicaid Other

## 2021-03-26 ENCOUNTER — Encounter: Payer: Self-pay | Admitting: Obstetrics and Gynecology

## 2021-04-09 ENCOUNTER — Encounter: Payer: Medicaid Other | Admitting: Obstetrics and Gynecology

## 2021-04-11 ENCOUNTER — Encounter: Payer: Self-pay | Admitting: Obstetrics and Gynecology

## 2021-04-16 ENCOUNTER — Other Ambulatory Visit: Payer: Self-pay | Admitting: Obstetrics and Gynecology

## 2021-04-16 DIAGNOSIS — F410 Panic disorder [episodic paroxysmal anxiety] without agoraphobia: Secondary | ICD-10-CM

## 2021-04-27 ENCOUNTER — Inpatient Hospital Stay
Admission: EM | Admit: 2021-04-27 | Discharge: 2021-04-29 | DRG: 807 | Disposition: A | Discharge status: Home / Self Care | Payer: Medicaid Other | Attending: Obstetrics and Gynecology | Admitting: Obstetrics and Gynecology

## 2021-04-27 ENCOUNTER — Encounter: Payer: Self-pay | Admitting: Obstetrics and Gynecology

## 2021-04-27 ENCOUNTER — Other Ambulatory Visit: Payer: Self-pay

## 2021-04-27 DIAGNOSIS — F1510 Other stimulant abuse, uncomplicated: Secondary | ICD-10-CM | POA: Diagnosis not present

## 2021-04-27 DIAGNOSIS — O0930 Supervision of pregnancy with insufficient antenatal care, unspecified trimester: Secondary | ICD-10-CM

## 2021-04-27 DIAGNOSIS — O99892 Other specified diseases and conditions complicating childbirth: Secondary | ICD-10-CM | POA: Diagnosis not present

## 2021-04-27 DIAGNOSIS — Z3A33 33 weeks gestation of pregnancy: Secondary | ICD-10-CM

## 2021-04-27 DIAGNOSIS — O99344 Other mental disorders complicating childbirth: Secondary | ICD-10-CM | POA: Diagnosis present

## 2021-04-27 DIAGNOSIS — O26893 Other specified pregnancy related conditions, third trimester: Secondary | ICD-10-CM | POA: Diagnosis present

## 2021-04-27 DIAGNOSIS — O99324 Drug use complicating childbirth: Secondary | ICD-10-CM | POA: Diagnosis not present

## 2021-04-27 DIAGNOSIS — O0932 Supervision of pregnancy with insufficient antenatal care, second trimester: Secondary | ICD-10-CM

## 2021-04-27 DIAGNOSIS — O09212 Supervision of pregnancy with history of pre-term labor, second trimester: Secondary | ICD-10-CM | POA: Diagnosis present

## 2021-04-27 DIAGNOSIS — F32A Depression, unspecified: Secondary | ICD-10-CM | POA: Diagnosis present

## 2021-04-27 DIAGNOSIS — O9089 Other complications of the puerperium, not elsewhere classified: Secondary | ICD-10-CM | POA: Diagnosis not present

## 2021-04-27 DIAGNOSIS — F419 Anxiety disorder, unspecified: Secondary | ICD-10-CM | POA: Diagnosis present

## 2021-04-27 DIAGNOSIS — O26892 Other specified pregnancy related conditions, second trimester: Secondary | ICD-10-CM | POA: Diagnosis present

## 2021-04-27 DIAGNOSIS — R109 Unspecified abdominal pain: Secondary | ICD-10-CM | POA: Diagnosis present

## 2021-04-27 DIAGNOSIS — D509 Iron deficiency anemia, unspecified: Secondary | ICD-10-CM | POA: Diagnosis present

## 2021-04-27 DIAGNOSIS — O99322 Drug use complicating pregnancy, second trimester: Secondary | ICD-10-CM | POA: Diagnosis present

## 2021-04-27 DIAGNOSIS — O99323 Drug use complicating pregnancy, third trimester: Secondary | ICD-10-CM | POA: Diagnosis present

## 2021-04-27 DIAGNOSIS — F17210 Nicotine dependence, cigarettes, uncomplicated: Secondary | ICD-10-CM | POA: Diagnosis present

## 2021-04-27 DIAGNOSIS — D500 Iron deficiency anemia secondary to blood loss (chronic): Secondary | ICD-10-CM

## 2021-04-27 DIAGNOSIS — O34219 Maternal care for unspecified type scar from previous cesarean delivery: Secondary | ICD-10-CM | POA: Diagnosis present

## 2021-04-27 DIAGNOSIS — O99345 Other mental disorders complicating the puerperium: Secondary | ICD-10-CM | POA: Diagnosis not present

## 2021-04-27 DIAGNOSIS — O99334 Smoking (tobacco) complicating childbirth: Secondary | ICD-10-CM | POA: Diagnosis present

## 2021-04-27 DIAGNOSIS — O9902 Anemia complicating childbirth: Secondary | ICD-10-CM | POA: Diagnosis present

## 2021-04-27 DIAGNOSIS — O0990 Supervision of high risk pregnancy, unspecified, unspecified trimester: Secondary | ICD-10-CM

## 2021-04-27 DIAGNOSIS — Z20822 Contact with and (suspected) exposure to covid-19: Secondary | ICD-10-CM | POA: Diagnosis present

## 2021-04-27 DIAGNOSIS — R000 Tachycardia, unspecified: Secondary | ICD-10-CM | POA: Diagnosis not present

## 2021-04-27 DIAGNOSIS — Z8482 Family history of sudden infant death syndrome: Secondary | ICD-10-CM

## 2021-04-27 DIAGNOSIS — O0933 Supervision of pregnancy with insufficient antenatal care, third trimester: Secondary | ICD-10-CM

## 2021-04-27 DIAGNOSIS — O99213 Obesity complicating pregnancy, third trimester: Secondary | ICD-10-CM

## 2021-04-27 DIAGNOSIS — F1910 Other psychoactive substance abuse, uncomplicated: Secondary | ICD-10-CM | POA: Diagnosis present

## 2021-04-27 LAB — URINALYSIS, ROUTINE W REFLEX MICROSCOPIC
Bilirubin Urine: NEGATIVE
Glucose, UA: NEGATIVE mg/dL
Hgb urine dipstick: NEGATIVE
Ketones, ur: 80 mg/dL — AB
Nitrite: NEGATIVE
Protein, ur: 100 mg/dL — AB
Specific Gravity, Urine: 1.03 (ref 1.005–1.030)
WBC, UA: >50 WBC/hpf — ABNORMAL HIGH (ref 0–5)
pH: 5 (ref 5.0–8.0)

## 2021-04-27 LAB — CBC
HCT: 31.6 % — ABNORMAL LOW (ref 36.0–46.0)
Hemoglobin: 10.2 g/dL — ABNORMAL LOW (ref 12.0–15.0)
MCH: 27.6 pg (ref 26.0–34.0)
MCHC: 32.3 g/dL (ref 30.0–36.0)
MCV: 85.4 fL (ref 80.0–100.0)
Platelets: 351 10*3/uL (ref 150–400)
RBC: 3.7 MIL/uL — ABNORMAL LOW (ref 3.87–5.11)
RDW: 13.7 % (ref 11.5–15.5)
WBC: 19 10*3/uL — ABNORMAL HIGH (ref 4.0–10.5)
nRBC: 0 % (ref 0.0–0.2)

## 2021-04-27 LAB — URINE DRUG SCREEN, QUALITATIVE (ARMC ONLY)
Amphetamines, Ur Screen: POSITIVE — AB
Barbiturates, Ur Screen: NOT DETECTED
Benzodiazepine, Ur Scrn: NOT DETECTED
Cannabinoid 50 Ng, Ur ~~LOC~~: NOT DETECTED
Cocaine Metabolite,Ur ~~LOC~~: NOT DETECTED
MDMA (Ecstasy)Ur Screen: NOT DETECTED
Methadone Scn, Ur: NOT DETECTED
Opiate, Ur Screen: NOT DETECTED
Phencyclidine (PCP) Ur S: NOT DETECTED
Tricyclic, Ur Screen: POSITIVE — AB

## 2021-04-27 LAB — RAPID HIV SCREEN (HIV 1/2 AB+AG)
HIV 1/2 Antibodies: NONREACTIVE
HIV-1 P24 Antigen - HIV24: NONREACTIVE

## 2021-04-27 LAB — TYPE AND SCREEN
ABO/RH(D): O POS
Antibody Screen: NEGATIVE

## 2021-04-27 MED ORDER — ACETAMINOPHEN 500 MG PO TABS
1000.0000 mg | ORAL_TABLET | Freq: Four times a day (QID) | ORAL | Status: DC | PRN
  Administered 2021-04-27 – 2021-04-29 (×5): 1000 mg via ORAL
  Filled 2021-04-27 (×6): qty 2

## 2021-04-27 MED ORDER — ONDANSETRON HCL 4 MG/2ML IJ SOLN: 4.0000 mg | Freq: Four times a day (QID) | INTRAMUSCULAR | Status: DC | PRN

## 2021-04-27 MED ORDER — BENZOCAINE-MENTHOL 20-0.5 % EX AERO
1.0000 "application " | INHALATION_SPRAY | CUTANEOUS | Status: DC | PRN
  Administered 2021-04-27: 1 via TOPICAL
  Filled 2021-04-27: qty 56

## 2021-04-27 MED ORDER — OXYTOCIN 10 UNIT/ML IJ SOLN
INTRAMUSCULAR | Status: AC
  Administered 2021-04-27: 10 [IU]
  Filled 2021-04-27: qty 2

## 2021-04-27 MED ORDER — IBUPROFEN 600 MG PO TABS
600.0000 mg | ORAL_TABLET | Freq: Four times a day (QID) | ORAL | Status: DC
  Administered 2021-04-27 – 2021-04-29 (×6): 600 mg via ORAL
  Filled 2021-04-27 (×8): qty 1

## 2021-04-27 MED ORDER — MISOPROSTOL 200 MCG PO TABS
ORAL_TABLET | ORAL | Status: AC
  Filled 2021-04-27: qty 4

## 2021-04-27 MED ORDER — OXYTOCIN 10 UNIT/ML IJ SOLN: 10.0000 [IU] | Freq: Once | INTRAMUSCULAR | Status: AC

## 2021-04-27 NOTE — OB Triage Note (Signed)
Patient presents to triage with CO labor pain that started around 1830. Patient denies VB or LOF. Patient endorses fetal movement. Patient states that she did meth today.

## 2021-04-27 NOTE — H&P (Signed)
Obstetric History and Physical  Susan [name] is a 25 y.o. [phone] with IUP at [redacted]w[redacted]d presenting for complaints of abdominal pain that started around 1830 today. Patient thinks she is in labor. She denied vaginal bleeding, intact membranes, with active fetal movement.    Of note, patient has had insufficient PNC this pregnancy.  Also, has h/o previous C-section followed by VBAC. Desires TOLAC again this pregnancy. Reports use of methamphetamines earlier today. Also with h/o SIDS of a previous child.    Prenatal Course Source of Care: [facility] with onset of care at 14 weeks (only 3 prenatal visits) Pregnancy complications or risks: Patient Active Problem List   Diagnosis Date Noted   Abdominal pain during pregnancy in third trimester 04/27/2021   Prenatal care insufficient 04/27/2021   Desires VBAC (vaginal birth after cesarean) trial 02/08/2021   MDD (major depressive disorder), recurrent episode, severe (HCC) 12/02/2019   Tobacco use complicating pregnancy, first trimester 08/21/2017   Family history of SIDS (sudden infant death syndrome) 05-15-2017   Supervision of high risk pregnancy, antepartum 04/06/2017   History of pre-eclampsia 04/06/2017   Marijuana abuse 12/07/2016   Abnormal liver function tests 09/18/2016   Chronic headaches 09/18/2016   She plans to bottle feed She desires Depo-Provera for postpartum contraception.   Prenatal labs and studies: ABO, Rh: O/Positive/-- (08/09 1116) Antibody: Negative (08/09 1116) Rubella: 2.69 (08/09 1116) RPR: Non Reactive (08/09 1116)  HBsAg: Negative (08/09 1116)  HIV: Non Reactive (08/09 1116)  GBS: unknown.  1 hr Glucola  not performed Genetic screening normal Anatomy US normal   Past Medical History:  Diagnosis Date   ADHD (attention deficit hyperactivity disorder)    Depression    Preeclampsia, severe, third trimester    HELLP Syndrome    Past Surgical History:  Procedure Laterality Date   NO PAST  SURGERIES      OB History  Gravida Para Term Preterm AB Living  4 2 2  0 1 1  SAB IAB Ectopic Multiple Live Births  1 0 0 0 2    # Outcome Date GA Lbr Len/2nd Weight Sex Delivery Anes PTL Lv  4 Current           3 Term 11/03/17 [redacted]w[redacted]d / 00:13 3550 g F Vag-Spont   LIV  2 Term 02/02/16 [redacted]w[redacted]d  3317 g M   N DEC     Complications: Preeclampsia, severe, third trimester  1 SAB             Social History   Socioeconomic History   Marital status: Single    Spouse name: Not on file   Number of children: 0   Years of education: Not on file   Highest education level: Not on file  Occupational History   Not on file  Tobacco Use   Smoking status: Every Day    Packs/day: 0.50    Types: Cigarettes   Smokeless tobacco: Never  Vaping Use   Vaping Use: Never used  Substance and Sexual Activity   Alcohol use: Not Currently    Comment: vodka   Drug use: Yes    Frequency: 1.0 times per week    Types: Marijuana, Methamphetamines   Sexual activity: Yes  Other Topics Concern   Not on file  Social History Narrative   Not on file   Social Determinants of Health   Financial Resource Strain: Not on file  Food Insecurity: Not on file  Transportation Needs: Not on file  Physical Activity:  Not on file  Stress: Not on file  Social Connections: Not on file    Family History  Problem Relation Age of Onset   Diabetes Mother    Diabetes Father    SIDS Son    Diabetes Paternal Aunt     Medications Prior to Admission  Medication Sig Dispense Refill Last Dose   aspirin EC 81 MG tablet Take 1 tablet (81 mg total) by mouth daily. Take after 12 weeks for prevention of preeclampssia later in pregnancy 300 tablet 2    Doxylamine-Pyridoxine (DICLEGIS) 10-10 MG TBEC Take 2 tablets by mouth at bedtime. If symptoms persist, add one tablet in the morning and one in the afternoon 100 tablet 5    famotidine (ZANTAC 360) 10 MG tablet Take 1 tablet (10 mg total) by mouth 2 (two) times daily. 60 tablet 1     hydrOXYzine (ATARAX/VISTARIL) 25 MG tablet Take 1 tablet (25 mg total) by mouth every 6 (six) hours as needed for itching. 20 tablet 0    ondansetron (ZOFRAN) 4 MG tablet Take 1 tablet (4 mg total) by mouth every 8 (eight) hours as needed for nausea or vomiting. 20 tablet 0    Pediatric Multivit-Minerals-C (FLINTSTONES GUMMIES) chewable tablet Chew 1 tablet by mouth daily. 90 tablet 3    promethazine (PHENERGAN) 25 MG tablet Take 1 tablet (25 mg total) by mouth every 6 (six) hours as needed for nausea or vomiting. 30 tablet 1     No Known Allergies  Review of Systems: Negative except for what is mentioned in HPI.  Physical Exam: BP 104/65    Pulse (!) 134    Temp 97.8 F (36.6 C) (Axillary)    Resp (!) 22    Ht 5\' 5"  (1.651 m)    Wt 99.8 kg    LMP  (LMP Unknown)    BMI 36.61 kg/m  CONSTITUTIONAL: Well-developed, well-nourished female in no acute distress.  HENT:  Normocephalic, atraumatic, External right and left ear normal. Oropharynx is clear and moist EYES: Conjunctivae and EOM are normal. Pupils are equal, round, and reactive to light. No scleral icterus.  NECK: Normal range of motion, supple, no masses SKIN: Skin is warm and dry. No rash noted. Not diaphoretic. No erythema. No pallor. NEUROLOGIC: Alert and oriented to person, place, and time. Normal reflexes, muscle tone coordination. No cranial nerve deficit noted. PSYCHIATRIC: Normal mood and affect. Normal behavior. Normal judgment and thought content. CARDIOVASCULAR: Normal heart rate noted, regular rhythm RESPIRATORY: Effort and breath sounds normal, no problems with respiration noted ABDOMEN: Soft, nontender, nondistended, gravid. MUSCULOSKELETAL: Normal range of motion. No edema and no tenderness. 2+ distal pulses.  Cervical Exam: Dilatation 10cm   Effacement 100%   Station unable to determine due to bulging membranes   Presentation: cephalic FHT:  Baseline rate 145 bpm   Variability moderate  Accelerations present    Decelerations none Contractions: Every 2-3 mins   Pertinent Labs/Studies:   Results for orders placed or performed during the hospital encounter of 04/27/21 (from the past 24 hour(s))  Urinalysis, Routine w reflex microscopic Urine, Clean Catch     Status: Abnormal   Collection Time: 04/27/21  7:48 PM  Result Value Ref Range   Color, Urine AMBER (A) YELLOW   APPearance CLOUDY (A) CLEAR   Specific Gravity, Urine 1.030 1.005 - 1.030   pH 5.0 5.0 - 8.0   Glucose, UA NEGATIVE NEGATIVE mg/dL   Hgb urine dipstick NEGATIVE NEGATIVE   Bilirubin Urine NEGATIVE NEGATIVE  Ketones, ur 80 (A) NEGATIVE mg/dL   Protein, ur 384 (A) NEGATIVE mg/dL   Nitrite NEGATIVE NEGATIVE   Leukocytes,Ua SMALL (A) NEGATIVE   RBC / HPF 11-20 0 - 5 RBC/hpf   WBC, UA >50 (H) 0 - 5 WBC/hpf   Bacteria, UA RARE (A) NONE SEEN   Squamous Epithelial / LPF 21-50 0 - 5   Mucus PRESENT    Non Squamous Epithelial PRESENT (A) NONE SEEN  Urine Drug Screen, Qualitative ([facility] only)     Status: Abnormal   Collection Time: 04/27/21  7:48 PM  Result Value Ref Range   Tricyclic, Ur Screen POSITIVE (A) NONE DETECTED   Amphetamines, Ur Screen POSITIVE (A) NONE DETECTED   MDMA (Ecstasy)Ur Screen NONE DETECTED NONE DETECTED   Cocaine Metabolite,Ur [place] NONE DETECTED NONE DETECTED   Opiate, Ur Screen NONE DETECTED NONE DETECTED   Phencyclidine (PCP) Ur S NONE DETECTED NONE DETECTED   Cannabinoid 50 Ng, Ur [place] NONE DETECTED NONE DETECTED   Barbiturates, Ur Screen NONE DETECTED NONE DETECTED   Benzodiazepine, Ur Scrn NONE DETECTED NONE DETECTED   Methadone Scn, Ur NONE DETECTED NONE DETECTED    Assessment : Susan [name] is a 25 y.o. G4P2011 at [redacted]w[redacted]d being admitted for active preterm labor with imminent delivery. Insufficient PNC and h/o substance use this pregnancy.  Plan: Labor: Patient went on to have spontaneous membranes with delivery of fetus upon my arrival.   Admitted to L&D. Routine postpartum orders.    Hildred Laser, MD [facility]

## 2021-04-28 ENCOUNTER — Encounter: Payer: Self-pay | Admitting: Obstetrics and Gynecology

## 2021-04-28 LAB — CBC
HCT: 24.7 % — ABNORMAL LOW (ref 36.0–46.0)
Hemoglobin: 8.1 g/dL — ABNORMAL LOW (ref 12.0–15.0)
MCH: 27.5 pg (ref 26.0–34.0)
MCHC: 32.8 g/dL (ref 30.0–36.0)
MCV: 83.7 fL (ref 80.0–100.0)
Platelets: 302 10*3/uL (ref 150–400)
RBC: 2.95 MIL/uL — ABNORMAL LOW (ref 3.87–5.11)
RDW: 13.7 % (ref 11.5–15.5)
WBC: 18.3 10*3/uL — ABNORMAL HIGH (ref 4.0–10.5)
nRBC: 0 % (ref 0.0–0.2)

## 2021-04-28 LAB — RPR: RPR Ser Ql: NONREACTIVE

## 2021-04-28 LAB — CHLAMYDIA/NGC RT PCR (ARMC ONLY)
Chlamydia Tr: NOT DETECTED
N gonorrhoeae: NOT DETECTED

## 2021-04-28 LAB — RESP PANEL BY RT-PCR (FLU A&B, COVID) ARPGX2
Influenza A by PCR: NEGATIVE
Influenza B by PCR: NEGATIVE
SARS Coronavirus 2 by RT PCR: NEGATIVE

## 2021-04-28 MED ORDER — COCONUT OIL OIL: 1.0000 "application " | TOPICAL_OIL | Status: DC | PRN

## 2021-04-28 MED ORDER — ONDANSETRON HCL 4 MG PO TABS: 4.0000 mg | ORAL_TABLET | ORAL | Status: DC | PRN

## 2021-04-28 MED ORDER — INFLUENZA VAC SPLIT QUAD 0.5 ML IM SUSY
0.5000 mL | PREFILLED_SYRINGE | INTRAMUSCULAR | Status: DC
  Filled 2021-04-28: qty 0.5

## 2021-04-28 MED ORDER — MEDROXYPROGESTERONE ACETATE 150 MG/ML IM SUSP
150.0000 mg | Freq: Once | INTRAMUSCULAR | Status: AC
  Administered 2021-04-28: 14:00:00 150 mg via INTRAMUSCULAR
  Filled 2021-04-28: qty 1

## 2021-04-28 MED ORDER — ZOLPIDEM TARTRATE 5 MG PO TABS
5.0000 mg | ORAL_TABLET | Freq: Every evening | ORAL | Status: DC | PRN
  Administered 2021-04-28 – 2021-04-29 (×2): 5 mg via ORAL
  Filled 2021-04-28 (×2): qty 1

## 2021-04-28 MED ORDER — DIBUCAINE (PERIANAL) 1 % EX OINT: 1.0000 "application " | TOPICAL_OINTMENT | CUTANEOUS | Status: DC | PRN

## 2021-04-28 MED ORDER — HYDROXYZINE HCL 25 MG PO TABS
25.0000 mg | ORAL_TABLET | Freq: Three times a day (TID) | ORAL | Status: DC | PRN
  Administered 2021-04-28 – 2021-04-29 (×2): 25 mg via ORAL
  Filled 2021-04-28 (×2): qty 1

## 2021-04-28 MED ORDER — ONDANSETRON HCL 4 MG/2ML IJ SOLN: 4.0000 mg | INTRAMUSCULAR | Status: DC | PRN

## 2021-04-28 MED ORDER — TETANUS-DIPHTH-ACELL PERTUSSIS 5-2.5-18.5 LF-MCG/0.5 IM SUSY
0.5000 mL | PREFILLED_SYRINGE | Freq: Once | INTRAMUSCULAR | Status: DC
  Filled 2021-04-28: qty 0.5

## 2021-04-28 MED ORDER — SIMETHICONE 80 MG PO CHEW: 80.0000 mg | CHEWABLE_TABLET | ORAL | Status: DC | PRN

## 2021-04-28 MED ORDER — INFLUENZA VAC A&B SA ADJ QUAD 0.5 ML IM PRSY: 0.5000 mL | PREFILLED_SYRINGE | INTRAMUSCULAR | Status: DC

## 2021-04-28 MED ORDER — WITCH HAZEL-GLYCERIN EX PADS: 1.0000 "application " | MEDICATED_PAD | CUTANEOUS | Status: DC | PRN

## 2021-04-28 MED ORDER — SENNOSIDES-DOCUSATE SODIUM 8.6-50 MG PO TABS
2.0000 | ORAL_TABLET | Freq: Every day | ORAL | Status: DC
  Administered 2021-04-28 – 2021-04-29 (×2): 2 via ORAL
  Filled 2021-04-28 (×2): qty 2

## 2021-04-28 MED ORDER — PRENATAL MULTIVITAMIN CH
1.0000 | ORAL_TABLET | Freq: Every day | ORAL | Status: DC
  Administered 2021-04-28 – 2021-04-29 (×2): 1 via ORAL
  Filled 2021-04-28 (×2): qty 1

## 2021-04-28 MED ORDER — DIPHENHYDRAMINE HCL 25 MG PO CAPS
25.0000 mg | ORAL_CAPSULE | Freq: Four times a day (QID) | ORAL | Status: DC | PRN
  Administered 2021-04-28 – 2021-04-29 (×2): 25 mg via ORAL
  Filled 2021-04-28 (×2): qty 1

## 2021-04-28 MED ORDER — ESCITALOPRAM OXALATE 10 MG PO TABS
10.0000 mg | ORAL_TABLET | Freq: Every day | ORAL | Status: DC
  Administered 2021-04-28 – 2021-04-29 (×2): 10 mg via ORAL
  Filled 2021-04-28 (×2): qty 1

## 2021-04-28 NOTE — TOC Initial Note (Signed)
Transition of Care [facility]) - Initial/Assessment Note    Patient Details  Name: Susan [name] MRN: [id number] Date of Birth: [birth year]  Transition of [facility]) CM/SW Contact:    Marina Goodell Phone Number: [phone] 04/28/2021, 12:30 PM  Clinical Narrative:                  CSW contacted [facility] CPS to make report.  Left message with operator w/ CSW contact information, as well as Dagoberto Reef [facility] contact information in case they are not abel to return call today 04/28/2021.       Patient Goals and CMS Choice        Expected Discharge Plan and Services                                                Prior Living Arrangements/Services                       Activities of Daily Living Home Assistive Devices/Equipment: None ADL Screening (condition at time of admission) Patient's cognitive ability adequate to safely complete daily activities?: Yes Is the patient deaf or have difficulty hearing?: No Does the patient have difficulty seeing, even when wearing glasses/contacts?: No Does the patient have difficulty concentrating, remembering, or making decisions?: No Patient able to express need for assistance with ADLs?: Yes Does the patient have difficulty dressing or bathing?: No Independently performs ADLs?: Yes (appropriate for developmental age) Does the patient have difficulty walking or climbing stairs?: No Weakness of Legs: None Weakness of Arms/Hands: None  Permission Sought/Granted                  Emotional Assessment              Admission diagnosis:  Abdominal pain during pregnancy in third trimester [O26.893, R10.9] Preterm labor third trimester with preterm delivery third trimester [O60.14X0] Patient Active Problem List   Diagnosis Date Noted   Abdominal pain during pregnancy in third trimester 04/27/2021   Prenatal care insufficient 04/27/2021   Preterm labor third trimester with preterm delivery third  trimester 04/27/2021   Desires VBAC (vaginal birth after cesarean) trial 02/08/2021   Obesity affecting pregnancy in first trimester 04/25/2020   MDD (major depressive disorder), recurrent episode, severe (HCC) 12/02/2019   Tobacco use complicating pregnancy, first trimester 08/21/2017   Family history of SIDS (sudden infant death syndrome) 29-May-2017   Supervision of high risk pregnancy, antepartum 04/06/2017   History of pre-eclampsia 04/06/2017   Marijuana abuse 12/07/2016   Abnormal liver function tests 09/18/2016   Chronic headaches 09/18/2016   PCP:  Patient, No Pcp Per (Inactive) Pharmacy:   [id number] [place], [place] - [address] AT [facility] [address] [place] [address] Phone: [phone] Fax: [phone]     Social Determinants of Health (SDOH) Interventions    Readmission Risk Interventions No flowsheet data found.

## 2021-04-28 NOTE — Progress Notes (Signed)
Post Partum Day # 1, s/p SVD  Subjective: Patient complains of anxiety and depression. Feels like things are just piling on top of her. Feels sad about having a preterm baby. Notes bleeding is finally slowing down. Pain is controlled with pain medications. Ambulating and tolerating PO.   Objective: Temp:  [97.4 F (36.3 C)-98.2 F (36.8 C)] 98.1 F (36.7 C) (12/18 1109) Pulse Rate:  [116-135] 125 (12/18 1109) Resp:  [18-22] 18 (12/18 1109) BP: (96-142)/(27-89) 142/89 (12/18 1109) SpO2:  [100 %] 100 % (12/18 1109) Weight:  [99.8 kg] 99.8 kg (12/17 1915)  Physical Exam:  General: alert and no distress  Lungs: clear to auscultation bilaterally Breasts: normal appearance, no masses or tenderness Heart: regular rate and rhythm, S1, S2 normal, no murmur, click, rub or gallop Abdomen: soft, non-tender; bowel sounds normal; no masses,  no organomegaly Pelvis: Lochia: appropriate, Uterine Fundus: firm Extremities: DVT Evaluation: No evidence of DVT seen on physical exam. Negative Homan's sign. No cords or calf tenderness. No significant calf/ankle edema.  Recent Labs    04/27/21 2152 04/28/21 0541  HGB 10.2* 8.1*  HCT 31.6* 24.7*    Assessment/Plan: Patient with h/o anxiety and depression, now noting symptoms. Will start on Lexapro. Also will give Vistaril for anxiety.  H/o substance abuse, limited PNC. Social Work consult placed.  Contraception Depo Provera One elevated BP recently, continue to monitor. Does have a h/o pre-eclampsia in previous pregnancy. Anemia of pregnancy, worsened with blood loss. Will treat with PO iron.  Dispo: likely d/c home tomorrow.    LOS: 1 day   Hildred Laser, MD [facility] 04/28/2021 12:29 PM

## 2021-04-28 NOTE — TOC Progression Note (Signed)
Transition of Care [facility]) - Progression Note    Patient Details  Name: Susan [name] MRN: [id number] Date of Birth: 09-15-1995  Transition of Care [facility]) CM/SW Contact  Joseph Art, [place] Phone Number: 04/28/2021, 5:05 PM  Clinical Narrative:     CSW spoke with patient and dicussed resources and support systems.  Patient answered all questions clearly. Patient seemed physically restless and did not make eye contact through out much of the conversation.  Patient mentioned the need for financial assistance and work.  Patient mentioned she would like to breast feed the baby and wants to stay clean.  CSW gave patient resources including substance detox treatment providers. Patient stated she did not currently have any other needs.  CSW updated patient on call to CPS.  Patient verbalized understanding.  CSW updated Unit RN.       Expected Discharge Plan and Services                                                 Social Determinants of Health (SDOH) Interventions    Readmission Risk Interventions No flowsheet data found.

## 2021-04-28 NOTE — Progress Notes (Signed)
Patient very tearful, rocking back and fourth in the bed while scratching her skin all over her body. Patient stated she has very bad anxiety but no thoughts of harming herself or others. Pt states she has a lot going on and "stuff with my family is always up and down". New orders placed for lexapro and hydroxyzine PRN. Encouraged patient to complete edinburgh and birth certificate and to call out for any of her needs.

## 2021-04-29 DIAGNOSIS — O99322 Drug use complicating pregnancy, second trimester: Secondary | ICD-10-CM | POA: Diagnosis present

## 2021-04-29 DIAGNOSIS — F1910 Other psychoactive substance abuse, uncomplicated: Secondary | ICD-10-CM | POA: Diagnosis present

## 2021-04-29 DIAGNOSIS — O99323 Drug use complicating pregnancy, third trimester: Secondary | ICD-10-CM | POA: Diagnosis present

## 2021-04-29 MED ORDER — IBUPROFEN 600 MG PO TABS
600.0000 mg | ORAL_TABLET | Freq: Four times a day (QID) | ORAL | 1 refills | Status: DC | PRN
Start: 2021-04-29 — End: 2022-02-22

## 2021-04-29 MED ORDER — DOCUSATE SODIUM 100 MG PO CAPS: 100.0000 mg | ORAL_CAPSULE | Freq: Two times a day (BID) | ORAL | 2 refills | Status: DC | PRN

## 2021-04-29 MED ORDER — NICOTINE 14 MG/24HR TD PT24
14.0000 mg | MEDICATED_PATCH | Freq: Every day | TRANSDERMAL | Status: DC
  Administered 2021-04-29: 16:00:00 14 mg via TRANSDERMAL
  Filled 2021-04-29: qty 1

## 2021-04-29 MED ORDER — ESCITALOPRAM OXALATE 10 MG PO TABS: 10.0000 mg | ORAL_TABLET | Freq: Every day | ORAL | 1 refills | Status: DC

## 2021-04-29 MED ORDER — FERROUS SULFATE 325 (65 FE) MG PO TABS: 325.0000 mg | ORAL_TABLET | Freq: Every day | ORAL | 1 refills | Status: DC

## 2021-04-29 NOTE — Progress Notes (Signed)
Pt called out and said her ride was here; another Char[employer] fundraiser and the [facility] escorted pt to medical mall to her ride; pt in wheelchair; pt's RN verified that pt had her DC paperwork earlier in the shift and pt said "yeah, in one of those bags"; pt DC'd to home; baby in [facility]

## 2021-04-29 NOTE — Progress Notes (Signed)
CPS workers at bedside to speak with patient.

## 2021-04-29 NOTE — Progress Notes (Signed)
Discharge instructions, prescriptions, and follow up appointments given to and reviewed with patient. Patient verbalized understanding. Patient deciding to stay until 11:59pm tonight so that she can visit with baby in NICU.

## 2021-04-29 NOTE — Progress Notes (Signed)
Pt called RN to room for ice and drink; pt reported to RN "something they're giving me is making my lip swell and turn blue"; RN looked at pt's lip; at this time, pt's lip appears normal pink color and doesn't appear swollen; pt reviewed the meds that the pt took on day shift and the meds that she is prescribed for going home; pt encouraged to call doctor if this happens again at home after taking a medication

## 2021-04-29 NOTE — Progress Notes (Signed)
Post Partum Day # 2, s/p SVD  Subjective: no complaints, up ad lib, voiding, and tolerating PO.  Objective: Vitals:   04/28/21 0820 04/28/21 1109 04/28/21 1527 04/28/21 2350  BP: 128/61 (!) 142/89 (!) 142/76 137/70  Pulse: (!) 116 (!) 125 (!) 117 (!) 126  Resp: 18 18 18 20   Temp: (!) 97.4 F (36.3 C) 98.1 F (36.7 C) (!) 97.4 F (36.3 C)   TempSrc: Oral Oral Oral   SpO2: 100% 100% 97% 100%  Weight:      Height:         Physical Exam:  General: alert and no distress  Lungs: clear to auscultation bilaterally Breasts: normal appearance, no masses or tenderness Heart: regular rate and rhythm, S1, S2 normal, no murmur, click, rub or gallop Abdomen: soft, non-tender; bowel sounds normal; no masses,  no organomegaly Pelvis: Lochia: appropriate, Uterine Fundus: firm Extremities: DVT Evaluation: No evidence of DVT seen on physical exam. Negative Homan's sign. No cords or calf tenderness. No significant calf/ankle edema.  Recent Labs    04/27/21 2152 04/28/21 0541  HGB 10.2* 8.1*  HCT 31.6* 24.7*    Assessment/Plan: Patient with h/o anxiety and depression, now noting symptoms. Started on Lexapro yesterday. Also will give Vistaril for anxiety.  H/o substance abuse, limited PNC. S/p Social Work consult.  Contraception Depo Provera, to administer prior to discharge. Labile BPs noted. Will continue to monitor. No meds initiated at this time. Mild tachycardia. May be secondary to anemia, or could be secondary to mild withdrawal.  Anemia of pregnancy, worsened with postpartum blood loss. Will treat with PO iron.  Dispo: likely d/c home today.  Infant remains in the NICU.    LOS: 2 days   04/30/21, MD [facility] 04/29/2021 8:02 AM

## 2021-04-29 NOTE — TOC Progression Note (Addendum)
Transition of Care [facility]) - Progression Note    Patient Details  Name: QUINTESSA SIMMERMAN MRN: [id number] Date of Birth: [birth year]  Transition [facility]) CM/SW Contact  Anselm Pancoast, RN Phone Number: 04/29/2021, 10:07 AM  Clinical Narrative:     CSW met with MOB and FOB of parents over the weekend.    RN CM completed CPS report with [facility].   Updated call from [facility]  reports they are coming to see MOB prior to discharge. Planning to arrive within 1-2 hours.        Expected Discharge Plan and Services           Expected Discharge Date: 04/29/21                                     Social Determinants of Health (SDOH) Interventions    Readmission Risk Interventions No flowsheet data found.

## 2021-04-29 NOTE — Discharge Summary (Signed)
Postpartum Discharge Summary     Patient Name: Susan [name] DOB: [birth year] MRN: [id number]  Date of admission: 04/27/2021 Delivery date:04/27/2021  Delivering provider: Minda Meo  Date of discharge: 04/29/2021  Admitting diagnosis: Abdominal pain during pregnancy in third trimester [O26.893, R10.9] Preterm labor third trimester with preterm delivery third trimester [O60.14X0] Intrauterine pregnancy: [redacted]w[redacted]d    Secondary diagnosis:  Principal Problem:   Abdominal pain during pregnancy in third trimester Active Problems:   Supervision of high risk pregnancy, antepartum   Family history of SIDS (sudden infant death syndrome)   Prenatal care insufficient   Preterm labor third trimester with preterm delivery third trimester   Substance abuse affecting pregnancy in third trimester, antepartum  Additional problems: Obesity in pregnancy , history of anxiety and depression, Anemia of pregnancy (iron deficiency)   Discharge diagnosis: Preterm Pregnancy Delivered and Anemia                                              Post partum procedures: None Augmentation: N/A Complications: None  Hospital course: Onset of Labor With Vaginal Delivery      25y.o. yo [id number] admitted in Active Labor on 04/27/2021. Patient had an uncomplicated labor course as follows:  Membrane Rupture Time/Date: 8:25 PM ,04/27/2021   Delivery Method:Vaginal, Spontaneous  Episiotomy: None  Lacerations:  None  Patient had an uncomplicated postpartum course.  She is ambulating, tolerating a regular diet, passing flatus, and urinating well. Patient is discharged home in stable condition on 05/01/21.  Newborn Data: Birth date:04/27/2021  Birth time:8:32 PM  Gender:Female  Living status:Living  Apgars:8 ,9  Weight:2670 g   Magnesium Sulfate received: No BMZ received: No Rhophylac:No MMR:No T-DaP:Given postpartum Flu: Given postpartum Transfusion:No  Physical exam  Vitals:   04/28/21  1527 04/28/21 2350 04/29/21 0849 04/29/21 1629  BP: (!) 142/76 137/70 113/64 138/81  Pulse: (!) 117 (!) 126 (!) 107 (!) 109  Resp: 18 20 16 20   Temp: (!) 97.4 F (36.3 C)  98.4 F (36.9 C) 99.2 F (37.3 C)  TempSrc: Oral  Oral Oral  SpO2: 97% 100% 98%   Weight:      Height:       General: alert, cooperative, and no distress Lochia: appropriate Uterine Fundus: firm Incision: N/A DVT Evaluation: No evidence of DVT seen on physical exam. Negative Homan's sign. No cords or calf tenderness.  No significant calf/ankle edema.   Labs: Lab Results  Component Value Date   WBC 18.3 (H) 04/28/2021   HGB 8.1 (L) 04/28/2021   HCT 24.7 (L) 04/28/2021   MCV 83.7 04/28/2021   PLT 302 04/28/2021    CMP Latest Ref Rng & Units 10/22/2020  Glucose 70 - 99 mg/dL 138(H)  BUN 6 - 20 mg/dL 11  Creatinine 0.44 - 1.00 mg/dL 0.76  Sodium 135 - 145 mmol/L 133(L)  Potassium 3.5 - 5.1 mmol/L 3.6  Chloride 98 - 111 mmol/L 105  CO2 22 - 32 mmol/L 20(L)  Calcium 8.9 - 10.3 mg/dL 8.6(L)  Total Protein 6.5 - 8.1 g/dL -  Total Bilirubin 0.3 - 1.2 mg/dL -  Alkaline Phos 38 - 126 U/L -  AST 15 - 41 U/L -  ALT 0 - 44 U/L -   Edinburgh Score: Edinburgh Postnatal Depression Scale Screening Tool 04/29/2021  I have been able to  laugh and see the funny side of things. 2  I have looked forward with enjoyment to things. 2  I have blamed myself unnecessarily when things went wrong. 3  I have been anxious or worried for no good reason. 3  I have felt scared or panicky for no good reason. 0  Things have been getting on top of me. 3  I have been so unhappy that I have had difficulty sleeping. 2  I have felt sad or miserable. 2  I have been so unhappy that I have been crying. 3  The thought of harming myself has occurred to me. 0  Edinburgh Postnatal Depression Scale Total 20     After visit meds:  Allergies as of 04/29/2021   No Known Allergies      Medication List     STOP taking these  medications    aspirin EC 81 MG tablet   Doxylamine-Pyridoxine 10-10 MG Tbec Commonly known as: Diclegis   famotidine 10 MG tablet Commonly known as: Zantac 360   Flintstones Gummies chewable tablet   ondansetron 4 MG tablet Commonly known as: Zofran   promethazine 25 MG tablet Commonly known as: PHENERGAN       TAKE these medications    docusate sodium 100 MG capsule Commonly known as: COLACE Take 1 capsule (100 mg total) by mouth 2 (two) times daily as needed.   escitalopram 10 MG tablet Commonly known as: LEXAPRO Take 1 tablet (10 mg total) by mouth daily.   ferrous sulfate 325 (65 FE) MG tablet Commonly known as: FerrouSul Take 1 tablet (325 mg total) by mouth daily with breakfast.   hydrOXYzine 25 MG tablet Commonly known as: ATARAX Take 1 tablet (25 mg total) by mouth every 6 (six) hours as needed for itching.   ibuprofen 600 MG tablet Commonly known as: ADVIL Take 1 tablet (600 mg total) by mouth every 6 (six) hours as needed for mild pain.         Discharge home in stable condition Infant Feeding:  tube feeding currently Infant Disposition:NICU Discharge instruction: per After Visit Summary and Postpartum booklet. Activity: Advance as tolerated. Pelvic rest for 6 weeks.  Diet: routine diet Anticipated Birth Control: PP Depo given Postpartum Appointment:6 weeks Additional Postpartum F/U: Postpartum Depression checkup and BP check  in 3-5 days Future Appointments:No future appointments. Follow up Visit:  Follow-up Information     Rubie Maid, MD Follow up.   Specialties: Obstetrics and Gynecology, Radiology Why: 3-4 days for BP check 2 weeks postpartum televisit mood check 6 weeks postpartum visit Contact information: [address] [place] [address] [phone]                     04/29/2021 Rubie Maid, MD

## 2021-04-30 LAB — SURGICAL PATHOLOGY

## 2022-01-01 ENCOUNTER — Emergency Department
Admission: EM | Admit: 2022-01-01 | Discharge: 2022-01-01 | Disposition: A | Discharge status: Home / Self Care | Payer: Medicaid Other | Attending: Emergency Medicine | Admitting: Emergency Medicine

## 2022-01-01 ENCOUNTER — Other Ambulatory Visit: Payer: Self-pay

## 2022-01-01 ENCOUNTER — Encounter: Payer: Self-pay | Admitting: Emergency Medicine

## 2022-01-01 DIAGNOSIS — R55 Syncope and collapse: Secondary | ICD-10-CM | POA: Insufficient documentation

## 2022-01-01 DIAGNOSIS — D72829 Elevated white blood cell count, unspecified: Secondary | ICD-10-CM | POA: Insufficient documentation

## 2022-01-01 DIAGNOSIS — R21 Rash and other nonspecific skin eruption: Secondary | ICD-10-CM | POA: Insufficient documentation

## 2022-01-01 LAB — COMPREHENSIVE METABOLIC PANEL
ALT: 35 U/L (ref 0–44)
AST: 25 U/L (ref 15–41)
Albumin: 4 g/dL (ref 3.5–5.0)
Alkaline Phosphatase: 104 U/L (ref 38–126)
Anion gap: 7 (ref 5–15)
BUN: 14 mg/dL (ref 6–20)
CO2: 20 mmol/L — ABNORMAL LOW (ref 22–32)
Calcium: 9.1 mg/dL (ref 8.9–10.3)
Chloride: 107 mmol/L (ref 98–111)
Creatinine, Ser: 0.64 mg/dL (ref 0.44–1.00)
GFR, Estimated: >60 mL/min (ref >60)
Glucose, Bld: 92 mg/dL (ref 70–99)
Potassium: 4.1 mmol/L (ref 3.5–5.1)
Sodium: 134 mmol/L — ABNORMAL LOW (ref 135–145)
Total Bilirubin: 0.9 mg/dL (ref 0.3–1.2)
Total Protein: 7.2 g/dL (ref 6.5–8.1)

## 2022-01-01 LAB — CBC WITH DIFFERENTIAL/PLATELET
Abs Immature Granulocytes: 0.06 10*3/uL (ref 0.00–0.07)
Basophils Absolute: 0.1 10*3/uL (ref 0.0–0.1)
Basophils Relative: 1 %
Eosinophils Absolute: 0.2 10*3/uL (ref 0.0–0.5)
Eosinophils Relative: 1 %
HCT: 34.7 % — ABNORMAL LOW (ref 36.0–46.0)
Hemoglobin: 11 g/dL — ABNORMAL LOW (ref 12.0–15.0)
Immature Granulocytes: 0 %
Lymphocytes Relative: 19 %
Lymphs Abs: 3 10*3/uL (ref 0.7–4.0)
MCH: 25.9 pg — ABNORMAL LOW (ref 26.0–34.0)
MCHC: 31.7 g/dL (ref 30.0–36.0)
MCV: 81.8 fL (ref 80.0–100.0)
Monocytes Absolute: 0.9 10*3/uL (ref 0.1–1.0)
Monocytes Relative: 6 %
Neutro Abs: 11.5 10*3/uL — ABNORMAL HIGH (ref 1.7–7.7)
Neutrophils Relative %: 73 %
Platelets: 318 10*3/uL (ref 150–400)
RBC: 4.24 MIL/uL (ref 3.87–5.11)
RDW: 18.2 % — ABNORMAL HIGH (ref 11.5–15.5)
WBC: 15.8 10*3/uL — ABNORMAL HIGH (ref 4.0–10.5)
nRBC: 0 % (ref 0.0–0.2)

## 2022-01-01 LAB — URINALYSIS, ROUTINE W REFLEX MICROSCOPIC
Bilirubin Urine: NEGATIVE
Glucose, UA: NEGATIVE mg/dL
Hgb urine dipstick: NEGATIVE
Ketones, ur: NEGATIVE mg/dL
Leukocytes,Ua: NEGATIVE
Nitrite: NEGATIVE
Protein, ur: NEGATIVE mg/dL
Specific Gravity, Urine: 1.024 (ref 1.005–1.030)
pH: 5 (ref 5.0–8.0)

## 2022-01-01 LAB — URINE DRUG SCREEN, QUALITATIVE (ARMC ONLY)
Amphetamines, Ur Screen: NOT DETECTED
Barbiturates, Ur Screen: NOT DETECTED
Benzodiazepine, Ur Scrn: NOT DETECTED
Cannabinoid 50 Ng, Ur ~~LOC~~: POSITIVE — AB
Cocaine Metabolite,Ur ~~LOC~~: POSITIVE — AB
MDMA (Ecstasy)Ur Screen: NOT DETECTED
Methadone Scn, Ur: NOT DETECTED
Opiate, Ur Screen: NOT DETECTED
Phencyclidine (PCP) Ur S: NOT DETECTED
Tricyclic, Ur Screen: NOT DETECTED

## 2022-01-01 LAB — HCG, QUANTITATIVE, PREGNANCY: hCG, Beta Chain, Quant, S: 114037 m[IU]/mL — ABNORMAL HIGH (ref <5)

## 2022-01-01 MED ORDER — TRIAMCINOLONE ACETONIDE 0.1 % EX CREA: 1.0000 | TOPICAL_CREAM | Freq: Two times a day (BID) | CUTANEOUS | 0 refills | Status: DC

## 2022-01-01 MED ORDER — CETIRIZINE HCL 10 MG PO TABS: 10.0000 mg | ORAL_TABLET | Freq: Every day | ORAL | 0 refills | Status: DC

## 2022-01-01 MED ORDER — DIPHENHYDRAMINE HCL 25 MG PO CAPS
25.0000 mg | ORAL_CAPSULE | Freq: Once | ORAL | Status: AC
  Administered 2022-01-01: 25 mg via ORAL
  Filled 2022-01-01: qty 1

## 2022-01-01 MED ORDER — LACTATED RINGERS IV BOLUS
1000.0000 mL | Freq: Once | INTRAVENOUS | Status: AC
  Administered 2022-01-01: 1000 mL via INTRAVENOUS

## 2022-01-01 NOTE — ED Triage Notes (Signed)
Pt arrived via POV with reports of rash to arms and legs, c/o shortness of breath and feels like throat is swelling, pt states she is pregnant but does not know how far along she is or when her last period was.

## 2022-01-01 NOTE — Discharge Instructions (Addendum)
I have prescribed you an antihistamine and a topical steroid cream that you can put on the rash.  If you are not improving please follow-up with dermatology.  Please follow-up with OB/GYN for prenatal care.

## 2022-01-01 NOTE — ED Notes (Signed)
Pt provided with sandwich tray upon request and approval of MD. While writer in room, pts 2 visitors requesting sandwich trays as well. Writer informs that

## 2022-01-01 NOTE — ED Provider Notes (Signed)
[facility] Provider Note    Event Date/Time   First MD Initiated Contact with Patient 01/01/22 [phone]     (approximate)   History   Allergic Reaction   HPI  [name] is Susan [name] 26 y.o. female past medical history of ADHD, depression preeclampsia with help syndrome who presents with Susan [name] rash.  She noticed this several days ago first started on her legs and then went to her arms and trunk.  It is itchy denies oral lesions denies new exposures.  Her sister's children have similar rash.  Patient also tells me that she is pregnant but can tot tell me when her last menstrual period was or when her positive pregnancy test was.  Nuys any abdominal pain or vaginal bleeding no nausea vomiting or diarrhea.  Has had several syncopal episodes.  Her sister who is in the room says she passed out multiple times.  Patient says she felt lightheaded yesterday and had about 4 episodes of passing out but cannot provide me much in the way of details.  Did not have any episodes today.  When she was about to pass out she felt somewhat short of breath but denies any shortness of breath or chest pain currently.  Denies any drug or alcohol use.  She has not had prenatal care.    Past Medical History:  Diagnosis Date   ADHD (attention deficit hyperactivity disorder)    Depression    Preeclampsia, severe, third trimester    HELLP Syndrome    Patient Active Problem List   Diagnosis Date Noted   Substance abuse affecting pregnancy in third trimester, antepartum 04/29/2021   Abdominal pain during pregnancy in third trimester 04/27/2021   Prenatal care insufficient 04/27/2021   Preterm labor third trimester with preterm delivery third trimester 04/27/2021   Desires VBAC (vaginal birth after cesarean) trial 02/08/2021   Obesity affecting pregnancy in first trimester 04/25/2020   MDD (major depressive disorder), recurrent episode, severe (HCC) 12/02/2019   Tobacco use complicating pregnancy,  first trimester 08/21/2017   Family history of SIDS (sudden infant death syndrome) 05/11/2017   Supervision of high risk pregnancy, antepartum 04/06/2017   History of pre-eclampsia 04/06/2017   Marijuana abuse 12/07/2016   Abnormal liver function tests 09/18/2016   Chronic headaches 09/18/2016     Physical Exam  Triage Vital Signs: ED Triage Vitals  Enc Vitals Group     BP 01/01/22 0234 (!) 150/90     Pulse Rate 01/01/22 0234 (!) 130     Resp 01/01/22 0234 15     Temp 01/01/22 0234 97.8 F (36.6 C)     Temp Source 01/01/22 0234 Oral     SpO2 01/01/22 0234 100 %     Weight 01/01/22 0234 175 lb (79.4 kg)     Height 01/01/22 0234 5\' 5"  (1.651 m)     Head Circumference --      Peak Flow --      Pain Score 01/01/22 0234 9     Pain Loc --      Pain Edu? --      Excl. in GC? --     Most recent vital signs: Vitals:   01/01/22 0234  BP: (!) 150/90  Pulse: (!) 130  Resp: 15  Temp: 97.8 F (36.6 C)  SpO2: 100%     General: Awake, no distress.  CV:  Good peripheral perfusion.  Resp:  Normal effort.  Lungs are clear Abd:  No distention.  Abdomen  is soft and nontender Neuro:             Awake, Alert, Oriented x 3  Other:  Patient has an erythematous urticarial appearing rash scattered on the bilateral thighs scattered across the abdomen and on the bilateral forearms.  Left forearm specifically has some excoriations. No oral lesions    ED Results / Procedures / Treatments  Labs (all labs ordered are listed, but only abnormal results are displayed) Labs Reviewed  URINALYSIS, ROUTINE W REFLEX MICROSCOPIC - Abnormal; Notable for the following components:      Result Value   Color, Urine YELLOW (*)    APPearance HAZY (*)    All other components within normal limits  URINE DRUG SCREEN, QUALITATIVE ([facility] ONLY) - Abnormal; Notable for the following components:   Cocaine Metabolite,Ur [place] POSITIVE (*)    Cannabinoid 50 Ng, Ur [place] POSITIVE (*)    All other components within  normal limits  CBC WITH DIFFERENTIAL/PLATELET - Abnormal; Notable for the following components:   WBC 15.8 (*)    Hemoglobin 11.0 (*)    HCT 34.7 (*)    MCH 25.9 (*)    RDW 18.2 (*)    Neutro Abs 11.5 (*)    All other components within normal limits  COMPREHENSIVE METABOLIC PANEL - Abnormal; Notable for the following components:   Sodium 134 (*)    CO2 20 (*)    All other components within normal limits  HCG, QUANTITATIVE, PREGNANCY - Abnormal; Notable for the following components:   hCG, Beta Chain, Quant, S 114,037 (*)    All other components within normal limits  POC URINE PREG, ED     EKG  EKG reviewed interpreted myself shows sinus tachycardia normal axis normal intervals no acute ischemic changes   RADIOLOGY I performed Susan [name] bedside ultrasound pelvic organs which shows an intrauterine pregnancy with fetal pole yolk sac and fetal heart rate   PROCEDURES:  Critical Care performed: No  .1-3 Lead EKG Interpretation  Performed by: Georga Hacking, MD Authorized by: Georga Hacking, MD     Interpretation: abnormal     ECG rate assessment: tachycardic     Rhythm: sinus tachycardia     Ectopy: none     Conduction: normal     The patient is on the cardiac monitor to evaluate for evidence of arrhythmia and/or significant heart rate changes.   MEDICATIONS ORDERED IN ED: Medications  lactated ringers bolus 1,000 mL (0 mLs Intravenous Stopped 01/01/22 0447)  diphenhydrAMINE (BENADRYL) capsule 25 mg (25 mg Oral Given 01/01/22 9833)     IMPRESSION / MDM / ASSESSMENT AND PLAN / ED COURSE  I reviewed the triage vital signs and the nursing notes.                              Patient's presentation is most consistent with acute presentation with potential threat to life or bodily function.  Differential diagnosis includes, but is not limited to, rash could be urticaria, contact dermatitis, infestation less likely EM, SJS Vasovagal syncope, orthostatic syncope,  hypovolemia, anemia, ectopic pregnancy  Patient is Susan [name] 26 year old female presents today for several issues.  She really comes for Susan [name] rash started yesterday is erythematous looks mostly urticarial to me but there are some excoriated areas which makes Susan [name] question if it is underlying contact dermatitis.  She denies new exposures.  She has no oral lesions.  Plan to treat as urticaria with  antihistamines for follow-up with GYN and dermatology.   Patient is also pregnant with unknown LMP unknown date of pregnancy test and no prenatal care.  She has had several syncopal episodes.  She does not give me much detail but does say that she had prodrome of lightheadedness and felt short of breath before passing out denies chest pain shortness of breath in between episodes.  She is tachycardic to the 130s satting normally mildly hypertensive.  He is alert and oriented but does appear somewhat disheveled.  Denies drug or alcohol use.  Obtained labs which show Susan [name] leukocytosis of 15.8 mild anemia to 11 normal CMP.  UDS positive for cocaine and marijuana.  UA does not suggest infection.  hCG over 100,000.  I performed Susan [name] bedside ultrasound and there is an IUP with fetal pole and fetal heart rate.  Unable to obtain dating dimensions on this ultrasound but certainly looks like first trimester pregnancy.  Patient received Susan [name] liter of fluid heart rate did improve to the 110s.  Consider pulmonary embolism given she is pregnant with the tachycardia and syncope but she denies any shortness of breath or chest pain and on reassessment continues to deny does not think this is less likely.  I had wanted to give her an additional liter of fluid because she was still tachycardic but patient ultimately did not want to stay, she is clinically improving already p.o. felt that she was okay to be discharged.  Will refer to gynecology for prenatal care, primary care and dermatology.      FINAL CLINICAL IMPRESSION(S) / ED DIAGNOSES   Final  diagnoses:  Rash  Syncope, unspecified syncope type     Rx / DC Orders   ED Discharge Orders          Ordered    cetirizine (ZYRTEC ALLERGY) 10 MG tablet  Daily        01/01/22 0548    triamcinolone cream (KENALOG) 0.1 %  2 times daily        01/01/22 0548             Note:  This document was prepared using Dragon voice recognition software and may include unintentional dictation errors.   Georga Hacking, MD 01/01/22 [phone]

## 2022-01-08 ENCOUNTER — Encounter: Payer: Self-pay | Admitting: Emergency Medicine

## 2022-01-08 DIAGNOSIS — F1210 Cannabis abuse, uncomplicated: Secondary | ICD-10-CM | POA: Insufficient documentation

## 2022-01-08 DIAGNOSIS — O2341 Unspecified infection of urinary tract in pregnancy, first trimester: Secondary | ICD-10-CM | POA: Insufficient documentation

## 2022-01-08 DIAGNOSIS — R000 Tachycardia, unspecified: Secondary | ICD-10-CM | POA: Diagnosis not present

## 2022-01-08 DIAGNOSIS — D72829 Elevated white blood cell count, unspecified: Secondary | ICD-10-CM | POA: Diagnosis not present

## 2022-01-08 DIAGNOSIS — Z5329 Procedure and treatment not carried out because of patient's decision for other reasons: Secondary | ICD-10-CM | POA: Diagnosis not present

## 2022-01-08 DIAGNOSIS — O99281 Endocrine, nutritional and metabolic diseases complicating pregnancy, first trimester: Secondary | ICD-10-CM | POA: Insufficient documentation

## 2022-01-08 DIAGNOSIS — O209 Hemorrhage in early pregnancy, unspecified: Secondary | ICD-10-CM | POA: Diagnosis not present

## 2022-01-08 DIAGNOSIS — O99321 Drug use complicating pregnancy, first trimester: Secondary | ICD-10-CM | POA: Diagnosis not present

## 2022-01-08 DIAGNOSIS — O26611 Liver and biliary tract disorders in pregnancy, first trimester: Secondary | ICD-10-CM | POA: Diagnosis not present

## 2022-01-08 DIAGNOSIS — O26891 Other specified pregnancy related conditions, first trimester: Secondary | ICD-10-CM | POA: Diagnosis present

## 2022-01-08 DIAGNOSIS — Z3A08 8 weeks gestation of pregnancy: Secondary | ICD-10-CM | POA: Diagnosis not present

## 2022-01-08 DIAGNOSIS — O99111 Other diseases of the blood and blood-forming organs and certain disorders involving the immune mechanism complicating pregnancy, first trimester: Secondary | ICD-10-CM | POA: Diagnosis not present

## 2022-01-08 DIAGNOSIS — E876 Hypokalemia: Secondary | ICD-10-CM | POA: Diagnosis not present

## 2022-01-08 DIAGNOSIS — M546 Pain in thoracic spine: Secondary | ICD-10-CM | POA: Insufficient documentation

## 2022-01-08 DIAGNOSIS — F1510 Other stimulant abuse, uncomplicated: Secondary | ICD-10-CM | POA: Diagnosis not present

## 2022-01-08 DIAGNOSIS — R7401 Elevation of levels of liver transaminase levels: Secondary | ICD-10-CM | POA: Insufficient documentation

## 2022-01-08 NOTE — ED Triage Notes (Signed)
Pt c/o dark yellow urine, yellow skin and feeling tired x1 month with continued syncopal episodes. Pt seen in ED on 8/23. Was informed she was pregnant. Pt c/o lower abdominal cramping x3 says with no vaginal bleeding. Pt admits that she is continuing to use "ICE" daily, last use today.

## 2022-01-09 ENCOUNTER — Emergency Department: Payer: Medicaid Other

## 2022-01-09 ENCOUNTER — Emergency Department
Admission: EM | Admit: 2022-01-09 | Discharge: 2022-01-09 | Discharge status: Against Medical Advice | Payer: Medicaid Other | Attending: Emergency Medicine | Admitting: Emergency Medicine

## 2022-01-09 DIAGNOSIS — E876 Hypokalemia: Secondary | ICD-10-CM

## 2022-01-09 DIAGNOSIS — N390 Urinary tract infection, site not specified: Secondary | ICD-10-CM

## 2022-01-09 DIAGNOSIS — R42 Dizziness and giddiness: Secondary | ICD-10-CM

## 2022-01-09 DIAGNOSIS — M549 Dorsalgia, unspecified: Secondary | ICD-10-CM

## 2022-01-09 DIAGNOSIS — O418X10 Other specified disorders of amniotic fluid and membranes, first trimester, not applicable or unspecified: Secondary | ICD-10-CM

## 2022-01-09 DIAGNOSIS — IMO0001 Reserved for inherently not codable concepts without codable children: Secondary | ICD-10-CM

## 2022-01-09 LAB — HCG, QUANTITATIVE, PREGNANCY: hCG, Beta Chain, Quant, S: >250000 m[IU]/mL — ABNORMAL HIGH (ref <5)

## 2022-01-09 LAB — URINALYSIS, ROUTINE W REFLEX MICROSCOPIC
Bilirubin Urine: NEGATIVE
Glucose, UA: NEGATIVE mg/dL
Hgb urine dipstick: NEGATIVE
Ketones, ur: NEGATIVE mg/dL
Nitrite: NEGATIVE
Protein, ur: 30 mg/dL — AB
Specific Gravity, Urine: 1.029 (ref 1.005–1.030)
WBC, UA: NONE SEEN WBC/hpf (ref 0–5)
pH: 6 (ref 5.0–8.0)

## 2022-01-09 LAB — CBC WITH DIFFERENTIAL/PLATELET
Abs Immature Granulocytes: 0.03 10*3/uL (ref 0.00–0.07)
Basophils Absolute: 0.1 10*3/uL (ref 0.0–0.1)
Basophils Relative: 1 %
Eosinophils Absolute: 0.1 10*3/uL (ref 0.0–0.5)
Eosinophils Relative: 1 %
HCT: 37.2 % (ref 36.0–46.0)
Hemoglobin: 11.7 g/dL — ABNORMAL LOW (ref 12.0–15.0)
Immature Granulocytes: 0 %
Lymphocytes Relative: 21 %
Lymphs Abs: 2.5 10*3/uL (ref 0.7–4.0)
MCH: 26.1 pg (ref 26.0–34.0)
MCHC: 31.5 g/dL (ref 30.0–36.0)
MCV: 82.9 fL (ref 80.0–100.0)
Monocytes Absolute: 0.8 10*3/uL (ref 0.1–1.0)
Monocytes Relative: 7 %
Neutro Abs: 8.8 10*3/uL — ABNORMAL HIGH (ref 1.7–7.7)
Neutrophils Relative %: 70 %
Platelets: 329 10*3/uL (ref 150–400)
RBC: 4.49 MIL/uL (ref 3.87–5.11)
RDW: 18.1 % — ABNORMAL HIGH (ref 11.5–15.5)
WBC: 12.3 10*3/uL — ABNORMAL HIGH (ref 4.0–10.5)
nRBC: 0 % (ref 0.0–0.2)

## 2022-01-09 LAB — URINE DRUG SCREEN, QUALITATIVE (ARMC ONLY)
Amphetamines, Ur Screen: POSITIVE — AB
Barbiturates, Ur Screen: NOT DETECTED
Benzodiazepine, Ur Scrn: NOT DETECTED
Cannabinoid 50 Ng, Ur ~~LOC~~: POSITIVE — AB
Cocaine Metabolite,Ur ~~LOC~~: NOT DETECTED
MDMA (Ecstasy)Ur Screen: NOT DETECTED
Methadone Scn, Ur: NOT DETECTED
Opiate, Ur Screen: NOT DETECTED
Phencyclidine (PCP) Ur S: NOT DETECTED
Tricyclic, Ur Screen: NOT DETECTED

## 2022-01-09 LAB — COMPREHENSIVE METABOLIC PANEL
ALT: 61 U/L — ABNORMAL HIGH (ref 0–44)
AST: 24 U/L (ref 15–41)
Albumin: 3.9 g/dL (ref 3.5–5.0)
Alkaline Phosphatase: 132 U/L — ABNORMAL HIGH (ref 38–126)
Anion gap: 9 (ref 5–15)
BUN: 10 mg/dL (ref 6–20)
CO2: 24 mmol/L (ref 22–32)
Calcium: 9.3 mg/dL (ref 8.9–10.3)
Chloride: 105 mmol/L (ref 98–111)
Creatinine, Ser: 0.61 mg/dL (ref 0.44–1.00)
GFR, Estimated: >60 mL/min (ref >60)
Glucose, Bld: 104 mg/dL — ABNORMAL HIGH (ref 70–99)
Potassium: 2.9 mmol/L — ABNORMAL LOW (ref 3.5–5.1)
Sodium: 138 mmol/L (ref 135–145)
Total Bilirubin: 0.9 mg/dL (ref 0.3–1.2)
Total Protein: 7.5 g/dL (ref 6.5–8.1)

## 2022-01-09 LAB — CHLAMYDIA/NGC RT PCR (ARMC ONLY)
Chlamydia Tr: DETECTED — AB
N gonorrhoeae: NOT DETECTED

## 2022-01-09 LAB — TROPONIN I (HIGH SENSITIVITY)
Troponin I (High Sensitivity): 4 ng/L (ref <18)
Troponin I (High Sensitivity): 4 ng/L (ref <18)

## 2022-01-09 LAB — WET PREP, GENITAL
Clue Cells Wet Prep HPF POC: NONE SEEN
Sperm: NONE SEEN
WBC, Wet Prep HPF POC: >=10 — AB (ref <10)
Yeast Wet Prep HPF POC: NONE SEEN

## 2022-01-09 LAB — MAGNESIUM: Magnesium: 2.1 mg/dL (ref 1.7–2.4)

## 2022-01-09 LAB — LIPASE, BLOOD: Lipase: 26 U/L (ref 11–51)

## 2022-01-09 MED ORDER — SODIUM CHLORIDE 0.9 % IV SOLN
1.0000 g | Freq: Once | INTRAVENOUS | Status: AC
  Administered 2022-01-09: 1 g via INTRAVENOUS

## 2022-01-09 MED ORDER — CEPHALEXIN 500 MG PO CAPS: 500.0000 mg | ORAL_CAPSULE | Freq: Two times a day (BID) | ORAL | 0 refills | Status: DC

## 2022-01-09 MED ORDER — POTASSIUM CHLORIDE CRYS ER 20 MEQ PO TBCR: 20.0000 meq | EXTENDED_RELEASE_TABLET | Freq: Two times a day (BID) | ORAL | 0 refills | Status: DC

## 2022-01-09 MED ORDER — SODIUM CHLORIDE 0.9 % IV BOLUS (SEPSIS)
1000.0000 mL | Freq: Once | INTRAVENOUS | Status: AC
  Administered 2022-01-09: 1000 mL via INTRAVENOUS

## 2022-01-09 MED ORDER — POTASSIUM CHLORIDE CRYS ER 20 MEQ PO TBCR
40.0000 meq | EXTENDED_RELEASE_TABLET | Freq: Once | ORAL | Status: AC
Start: 2022-01-09 — End: 2022-01-09
  Administered 2022-01-09: 40 meq via ORAL
  Filled 2022-01-09: qty 2

## 2022-01-09 NOTE — ED Notes (Signed)
ED Provider at bedside. 

## 2022-01-09 NOTE — ED Provider Notes (Signed)
[facility] Provider Note    Event Date/Time   First MD Initiated Contact with Patient 01/09/22 [phone]     (approximate)   History   Weakness   HPI  CHERYAL SALAS is a 26 y.o. female history of substance abuse who presents to the emergency department multiple complaints.  Patient states that multiple family members have been telling her that her skin appears yellow and she is concerned that she is jaundiced.  She complains that she has had some right upper back pain but no upper abdominal pain.  She denies any nausea, vomiting, diarrhea.  She does complain of lower abdominal cramping and yellow vaginal discharge.  No bleeding.  She is currently pregnant.  She is not sure of her last menstrual cycle.  Her OB/GYN previously was with [facility].  She has not seen them yet.  She reports this is her fourth pregnancy.  She has had 3 living children and one of them is now deceased.  She states that she desires to keep this pregnancy.  She also reports she has had intermittent dizziness and feels like the left side of her face has been swollen.  No chest pain or shortness of breath.  States she has been recently using cocaine.  She states she was "tricked" into using methamphetamine.   History provided by patient and mother.    Past Medical History:  Diagnosis Date   ADHD (attention deficit hyperactivity disorder)    Depression    Preeclampsia, severe, third trimester    HELLP Syndrome    Past Surgical History:  Procedure Laterality Date   NO PAST SURGERIES      MEDICATIONS:  Prior to Admission medications   Medication Sig Start Date End Date Taking? Authorizing Provider  cetirizine (ZYRTEC ALLERGY) 10 MG tablet Take 1 tablet (10 mg total) by mouth daily. 01/01/22 01/31/22  Georga Hacking, MD  docusate sodium (COLACE) 100 MG capsule Take 1 capsule (100 mg total) by mouth 2 (two) times daily as needed. 04/29/21   Hildred Laser, MD  escitalopram  (LEXAPRO) 10 MG tablet Take 1 tablet (10 mg total) by mouth daily. 04/29/21   Hildred Laser, MD  ferrous sulfate (FERROUSUL) 325 (65 FE) MG tablet Take 1 tablet (325 mg total) by mouth daily with breakfast. 04/29/21   Hildred Laser, MD  hydrOXYzine (ATARAX/VISTARIL) 25 MG tablet Take 1 tablet (25 mg total) by mouth every 6 (six) hours as needed for itching. 11/20/20   Linzie Collin, MD  ibuprofen (ADVIL) 600 MG tablet Take 1 tablet (600 mg total) by mouth every 6 (six) hours as needed for mild pain. 04/29/21   Hildred Laser, MD  triamcinolone cream (KENALOG) 0.1 % Apply 1 Application topically 2 (two) times daily. 01/01/22   Georga Hacking, MD    Physical Exam   Triage Vital Signs: ED Triage Vitals  Enc Vitals Group     BP 01/08/22 2335 (!) 150/83     Pulse Rate 01/08/22 2335 (!) 126     Resp 01/08/22 2335 18     Temp 01/08/22 2335 (!) 96.3 F (35.7 C)     Temp Source 01/09/22 0231 Oral     SpO2 01/08/22 2335 94 %     Weight --      Height --      Head Circumference --      Peak Flow --      Pain Score --      Pain Loc --  Pain Edu? --      Excl. in GC? --     Most recent vital signs: Vitals:   01/09/22 0231 01/09/22 0615  BP: 131/82 134/78  Pulse: (!) 122 (!) 106  Resp: 20 18  Temp: 97.7 F (36.5 C)   SpO2: 100% 100%    CONSTITUTIONAL: Alert and oriented and responds appropriately to questions.  Febrile and chronically ill-appearing, in no distress HEAD: Normocephalic, atraumatic EYES: Conjunctivae clear, pupils appear equal, sclera nonicteric ENT: normal nose; moist mucous membranes, no facial swelling on exam, no redness or warmth NECK: Supple, normal ROM CARD: Regular and tachycardic; S1 and S2 appreciated; no murmurs, no clicks, no rubs, no gallops RESP: Normal chest excursion without splinting or tachypnea; breath sounds clear and equal bilaterally; no wheezes, no rhonchi, no rales, no hypoxia or respiratory distress, speaking full sentences ABD/GI:  Normal bowel sounds; non-distended; soft, non-tender, no rebound, no guarding, no peritoneal signs, no tenderness at McBurney's point and negative Murphy sign BACK: The back appears normal EXT: Normal ROM in all joints; no deformity noted, no edema; no cyanosis SKIN: Normal color for age and race; warm; no rash on exposed skin; no jaundice on exam NEURO: Moves all extremities equally, normal speech, no facial asymmetry PSYCH: The patient's mood and manner are appropriate.   ED Results / Procedures / Treatments   LABS: (all labs ordered are listed, but only abnormal results are displayed) Labs Reviewed  CBC WITH DIFFERENTIAL/PLATELET - Abnormal; Notable for the following components:      Result Value   WBC 12.3 (*)    Hemoglobin 11.7 (*)    RDW 18.1 (*)    Neutro Abs 8.8 (*)    All other components within normal limits  HCG, QUANTITATIVE, PREGNANCY - Abnormal; Notable for the following components:   hCG, Beta Chain, Quant, S >250,000 (*)    All other components within normal limits  COMPREHENSIVE METABOLIC PANEL - Abnormal; Notable for the following components:   Potassium 2.9 (*)    Glucose, Bld 104 (*)    ALT 61 (*)    Alkaline Phosphatase 132 (*)    All other components within normal limits  URINE DRUG SCREEN, QUALITATIVE ([facility] ONLY) - Abnormal; Notable for the following components:   Amphetamines, Ur [place] POSITIVE (*)    Cannabinoid 50 Ng, Ur [place] POSITIVE (*)    All other components within normal limits  URINALYSIS, ROUTINE W REFLEX MICROSCOPIC - Abnormal; Notable for the following components:   Color, Urine YELLOW (*)    APPearance TURBID (*)    Protein, ur 30 (*)    Leukocytes,Ua LARGE (*)    Bacteria, UA MANY (*)    All other components within normal limits  WET PREP, GENITAL  CHLAMYDIA/NGC RT PCR ([facility] ONLY)            URINE CULTURE  LIPASE, BLOOD  MAGNESIUM  TROPONIN I (HIGH SENSITIVITY)  TROPONIN I (HIGH SENSITIVITY)     EKG:  EKG  Interpretation  Date/Time:  Thursday January 09 2022 00:02:37 EDT Ventricular Rate:  127 PR Interval:  146 QRS Duration: 90 QT Interval:  300 QTC Calculation: 436 R Axis:   126 Text Interpretation: Sinus tachycardia Right atrial enlargement Right axis deviation Pulmonary disease pattern Abnormal ECG When compared with ECG of 01-Jan-2022 03:27, PREVIOUS ECG IS PRESENT Confirmed by Rochele Raring [phone]) on 01/09/2022 6:31:53 AM         RADIOLOGY: My personal review and interpretation of imaging: OB ultrasound shows small  subchorionic hemorrhage.  Shows a single viable IUP with normal fetal heart tones.  Right upper quadrant ultrasound pending.  I have personally reviewed all radiology reports.   US OB LESS THAN 14 WEEKS WITH OB TRANSVAGINAL  Result Date: 01/09/2022 CLINICAL DATA:  Initial evaluation for acute lower abdominal cramping, early pregnancy. EXAM: OBSTETRIC <14 WK [place] AND TRANSVAGINAL OB US TECHNIQUE: Both transabdominal and transvaginal ultrasound examinations were performed for complete evaluation of the gestation as well as the maternal uterus, adnexal regions, and pelvic cul-de-sac. Transvaginal technique was performed to assess early pregnancy. COMPARISON:  None Available. FINDINGS: Intrauterine gestational sac: Single Yolk sac:  Present Embryo:  Present Cardiac Activity: Present Heart Rate: 180 bpm CRL:  19.0 mm   8 w   3 d                  [place] EDC: 08/18/2022 Subchorionic hemorrhage: Small subchorionic hemorrhage measuring 1.1 x 0.8 x 0.3 cm without associated mass effect. Maternal uterus/adnexae: Ovaries within normal limits. 1.9 cm degenerating corpus luteal cyst noted within the right ovary. No adnexal mass or free fluid. IMPRESSION: 1. Single viable IUP, estimated gestational age [redacted] weeks and 3 days by crown-rump length with ultrasound EDC of 08/18/2022. 2. Small subchorionic hemorrhage without mass effect. 3. 1.9 cm degenerating right ovarian corpus luteal cyst. 4. No other acute  maternal uterine or adnexal abnormality. Electronically Signed   By: Rise Mu M.D.   On: 01/09/2022 02:37     PROCEDURES:  Critical Care performed: No      Procedures    IMPRESSION / MDM / ASSESSMENT AND PLAN / ED COURSE  I reviewed the triage vital signs and the nursing notes.    Patient here with multiple complaints.  She complains of left-sided facial swelling, dizziness, feeling like she is jaundice, right upper back pain, lower abdominal pain, vaginal discharge.  The patient is on the cardiac monitor to evaluate for evidence of arrhythmia and/or significant heart rate changes.   DIFFERENTIAL DIAGNOSIS (includes but not limited to):   UTI, subchorionic hemorrhage, STI, less likely PID given patient is pregnant and has a benign abdominal exam.  Doubt appendicitis.  Differential also includes cholelithiasis, cholecystitis, liver failure.  No sign of facial cellulitis on exam.  I do not appreciate any facial swelling.  Dizziness may be secondary to pregnancy, dehydration.  Doubt arrhythmia, ACS, PE, dissection.   Patient's presentation is most consistent with acute presentation with potential threat to life or bodily function.   PLAN: CBC, CMP, lipase, hCG, urinalysis, urine drug screen, troponin x2 obtained in triage.  Labs show leukocytosis of 12,000 which appears to be chronic for patient.  She has no fever here.  Potassium slightly low at 2.9.  Will give oral replacement.  Magnesium level normal.  EKG is nonischemic without arrhythmia but she is tachycardic.  This could be from recent illicit drug use.  Recheck has improved but will give IV fluids given tachycardia and continued dizziness.  Will check orthostatic vital signs.  Urinalysis does show large leukocytes many bacteria with no squamous cells.  Given she is pregnant, will treat with Rocephin here and discharged with Keflex.  Culture is pending.  She also complains of vaginal discharge.  We will allow her to  self swab for wet prep and gonorrhea and chlamydia.  OB ultrasound reviewed and interpreted by myself and the radiologist and shows a single viable IUP at 8 weeks and 3 days with a small subchorionic hemorrhage.  She  is not having any vaginal bleeding today.  Her labs do show minimally elevated ALT and alkaline phosphatase but normal AST, total bilirubin and lipase.  Given she is having some right upper back pain but no upper abdominal pain, we discussed possibly obtaining a right upper quadrant ultrasound.  Mother would prefer that this be done today and patient agrees.  Her drug screen is positive for amphetamines and cannabinoids.  We have a lengthy discussion about the dangers of using illicit substances while pregnant and she states this is a pregnancy that she wants.  I recommended she start prenatal vitamins and follow-up with an [facility] who she has seen before for previous pregnancies.  Signed out the oncoming ED physician to follow-up on patient's right upper quadrant ultrasound and pelvic swabs.  Anticipate that patient will likely be able to be discharged home unless surgical abnormality seen on the ultrasound.   MEDICATIONS GIVEN IN ED: Medications  sodium chloride 0.9 % bolus 1,000 mL (has no administration in time range)  cefTRIAXone (ROCEPHIN) 1 g in sodium chloride 0.9 % 100 mL IVPB (has no administration in time range)  potassium chloride SA (KLOR-CON M) CR tablet 40 mEq (40 mEq Oral Given 01/09/22 1517)     ED COURSE: Orthostats, right upper quadrant ultrasound, pelvic swabs pending.   CONSULTS: Dispo per further work-up.   OUTSIDE RECORDS REVIEWED: Reviewed patient's last office visit with Glenna Fellows on 12/12/2020.       FINAL CLINICAL IMPRESSION(S) / ED DIAGNOSES   Final diagnoses:  Upper back pain  Subchorionic hemorrhage of placenta in first trimester, single or unspecified fetus  Hypokalemia  Acute UTI  Lightheadedness     Rx / DC Orders   ED  Discharge Orders          Ordered    potassium chloride SA (KLOR-CON M) 20 MEQ tablet  2 times daily        01/09/22 0709    cephALEXin (KEFLEX) 500 MG capsule  2 times daily        01/09/22 0709             Note:  This document was prepared using Dragon voice recognition software and may include unintentional dictation errors.   Deanndra Kirley, Layla Maw, DO 01/09/22 [phone]

## 2022-01-09 NOTE — ED Notes (Signed)
Pt brought to ED rm 15, this RN now assuming care.

## 2022-01-09 NOTE — Discharge Instructions (Addendum)
Please seek medical attention for any high fevers, chest pain, shortness of breath, change in behavior, persistent vomiting, bloody stool or any other new or concerning symptoms.  

## 2022-01-09 NOTE — ED Notes (Signed)
Spoke with Lea in lab who stated pts urine RBC count was 0.

## 2022-01-10 ENCOUNTER — Telehealth: Payer: Self-pay | Admitting: Emergency Medicine

## 2022-01-10 NOTE — Telephone Encounter (Signed)
Called patient to inform of std results and need for treatment.   She is positive for trich and chlamydia.  Per dr Marisa Severin need to call in flagyl 500 mg po twice daily for 7 days and azithromycin 1 gram po x 1 dose to patient's preferred pharmacy.  The voicemail for patient phone is actually her aunt's number [name].  I left a message that I am trying to contact Susan [name] and number to call me and ED number.

## 2022-01-11 LAB — URINE CULTURE

## 2022-02-21 DIAGNOSIS — O99332 Smoking (tobacco) complicating pregnancy, second trimester: Secondary | ICD-10-CM | POA: Diagnosis present

## 2022-02-21 DIAGNOSIS — O0932 Supervision of pregnancy with insufficient antenatal care, second trimester: Secondary | ICD-10-CM

## 2022-02-21 DIAGNOSIS — O98312 Other infections with a predominantly sexual mode of transmission complicating pregnancy, second trimester: Secondary | ICD-10-CM | POA: Diagnosis present

## 2022-02-21 DIAGNOSIS — O99212 Obesity complicating pregnancy, second trimester: Secondary | ICD-10-CM | POA: Diagnosis present

## 2022-02-21 DIAGNOSIS — F17210 Nicotine dependence, cigarettes, uncomplicated: Secondary | ICD-10-CM | POA: Diagnosis present

## 2022-02-21 DIAGNOSIS — Z3A14 14 weeks gestation of pregnancy: Secondary | ICD-10-CM

## 2022-02-21 DIAGNOSIS — A5901 Trichomonal vulvovaginitis: Secondary | ICD-10-CM | POA: Diagnosis present

## 2022-02-21 DIAGNOSIS — O99891 Other specified diseases and conditions complicating pregnancy: Secondary | ICD-10-CM | POA: Diagnosis present

## 2022-02-21 DIAGNOSIS — R000 Tachycardia, unspecified: Secondary | ICD-10-CM | POA: Diagnosis present

## 2022-02-21 DIAGNOSIS — O23592 Infection of other part of genital tract in pregnancy, second trimester: Principal | ICD-10-CM | POA: Diagnosis present

## 2022-02-21 DIAGNOSIS — O34211 Maternal care for low transverse scar from previous cesarean delivery: Secondary | ICD-10-CM | POA: Diagnosis present

## 2022-02-22 ENCOUNTER — Emergency Department: Payer: Medicaid Other

## 2022-02-22 ENCOUNTER — Inpatient Hospital Stay
Admission: EM | Admit: 2022-02-22 | Discharge: 2022-02-23 | DRG: 832 | Discharge status: Against Medical Advice | Payer: Medicaid Other | Attending: Obstetrics and Gynecology | Admitting: Obstetrics and Gynecology

## 2022-02-22 ENCOUNTER — Other Ambulatory Visit: Payer: Self-pay

## 2022-02-22 DIAGNOSIS — N730 Acute parametritis and pelvic cellulitis: Principal | ICD-10-CM

## 2022-02-22 DIAGNOSIS — N739 Female pelvic inflammatory disease, unspecified: Secondary | ICD-10-CM | POA: Diagnosis present

## 2022-02-22 DIAGNOSIS — R000 Tachycardia, unspecified: Secondary | ICD-10-CM | POA: Diagnosis present

## 2022-02-22 DIAGNOSIS — O34211 Maternal care for low transverse scar from previous cesarean delivery: Secondary | ICD-10-CM | POA: Diagnosis present

## 2022-02-22 DIAGNOSIS — O99891 Other specified diseases and conditions complicating pregnancy: Secondary | ICD-10-CM | POA: Diagnosis present

## 2022-02-22 DIAGNOSIS — Z3A14 14 weeks gestation of pregnancy: Secondary | ICD-10-CM | POA: Diagnosis not present

## 2022-02-22 DIAGNOSIS — R509 Fever, unspecified: Secondary | ICD-10-CM | POA: Diagnosis present

## 2022-02-22 DIAGNOSIS — O0932 Supervision of pregnancy with insufficient antenatal care, second trimester: Secondary | ICD-10-CM | POA: Diagnosis not present

## 2022-02-22 DIAGNOSIS — O99212 Obesity complicating pregnancy, second trimester: Secondary | ICD-10-CM | POA: Diagnosis present

## 2022-02-22 DIAGNOSIS — O99332 Smoking (tobacco) complicating pregnancy, second trimester: Secondary | ICD-10-CM | POA: Diagnosis present

## 2022-02-22 DIAGNOSIS — O23592 Infection of other part of genital tract in pregnancy, second trimester: Secondary | ICD-10-CM | POA: Diagnosis present

## 2022-02-22 DIAGNOSIS — A5901 Trichomonal vulvovaginitis: Secondary | ICD-10-CM | POA: Diagnosis present

## 2022-02-22 DIAGNOSIS — F17210 Nicotine dependence, cigarettes, uncomplicated: Secondary | ICD-10-CM | POA: Diagnosis present

## 2022-02-22 DIAGNOSIS — O98312 Other infections with a predominantly sexual mode of transmission complicating pregnancy, second trimester: Secondary | ICD-10-CM | POA: Diagnosis present

## 2022-02-22 LAB — COMPREHENSIVE METABOLIC PANEL
ALT: 29 U/L (ref 0–44)
AST: 21 U/L (ref 15–41)
Albumin: 3.7 g/dL (ref 3.5–5.0)
Alkaline Phosphatase: 155 U/L — ABNORMAL HIGH (ref 38–126)
Anion gap: 9 (ref 5–15)
BUN: 10 mg/dL (ref 6–20)
CO2: 21 mmol/L — ABNORMAL LOW (ref 22–32)
Calcium: 9.5 mg/dL (ref 8.9–10.3)
Chloride: 104 mmol/L (ref 98–111)
Creatinine, Ser: 0.62 mg/dL (ref 0.44–1.00)
GFR, Estimated: >60 mL/min (ref >60)
Glucose, Bld: 160 mg/dL — ABNORMAL HIGH (ref 70–99)
Potassium: 2.9 mmol/L — ABNORMAL LOW (ref 3.5–5.1)
Sodium: 134 mmol/L — ABNORMAL LOW (ref 135–145)
Total Bilirubin: 0.7 mg/dL (ref 0.3–1.2)
Total Protein: 7.8 g/dL (ref 6.5–8.1)

## 2022-02-22 LAB — PROTIME-INR
INR: 1.1 (ref 0.8–1.2)
Prothrombin Time: 13.6 seconds (ref 11.4–15.2)

## 2022-02-22 LAB — URINE DRUG SCREEN, QUALITATIVE (ARMC ONLY)
Amphetamines, Ur Screen: POSITIVE — AB
Barbiturates, Ur Screen: NOT DETECTED
Benzodiazepine, Ur Scrn: NOT DETECTED
Cannabinoid 50 Ng, Ur ~~LOC~~: POSITIVE — AB
Cocaine Metabolite,Ur ~~LOC~~: NOT DETECTED
MDMA (Ecstasy)Ur Screen: NOT DETECTED
Methadone Scn, Ur: NOT DETECTED
Opiate, Ur Screen: NOT DETECTED
Phencyclidine (PCP) Ur S: NOT DETECTED
Tricyclic, Ur Screen: NOT DETECTED

## 2022-02-22 LAB — CHLAMYDIA/NGC RT PCR (ARMC ONLY)
Chlamydia Tr: NOT DETECTED
N gonorrhoeae: NOT DETECTED

## 2022-02-22 LAB — URINALYSIS, ROUTINE W REFLEX MICROSCOPIC
Bilirubin Urine: NEGATIVE
Glucose, UA: NEGATIVE mg/dL
Ketones, ur: 5 mg/dL — AB
Nitrite: NEGATIVE
Protein, ur: NEGATIVE mg/dL
Specific Gravity, Urine: 1.027 (ref 1.005–1.030)
pH: 5 (ref 5.0–8.0)

## 2022-02-22 LAB — CBC WITH DIFFERENTIAL/PLATELET
Abs Immature Granulocytes: 0.06 10*3/uL (ref 0.00–0.07)
Basophils Absolute: 0.1 10*3/uL (ref 0.0–0.1)
Basophils Relative: 0 %
Eosinophils Absolute: 0.1 10*3/uL (ref 0.0–0.5)
Eosinophils Relative: 0 %
HCT: 40 % (ref 36.0–46.0)
Hemoglobin: 13.1 g/dL (ref 12.0–15.0)
Immature Granulocytes: 0 %
Lymphocytes Relative: 19 %
Lymphs Abs: 2.9 10*3/uL (ref 0.7–4.0)
MCH: 28.1 pg (ref 26.0–34.0)
MCHC: 32.8 g/dL (ref 30.0–36.0)
MCV: 85.7 fL (ref 80.0–100.0)
Monocytes Absolute: 0.8 10*3/uL (ref 0.1–1.0)
Monocytes Relative: 5 %
Neutro Abs: 11.5 10*3/uL — ABNORMAL HIGH (ref 1.7–7.7)
Neutrophils Relative %: 76 %
Platelets: 318 10*3/uL (ref 150–400)
RBC: 4.67 MIL/uL (ref 3.87–5.11)
RDW: 18.3 % — ABNORMAL HIGH (ref 11.5–15.5)
WBC: 15.4 10*3/uL — ABNORMAL HIGH (ref 4.0–10.5)
nRBC: 0 % (ref 0.0–0.2)

## 2022-02-22 LAB — WET PREP, GENITAL
Clue Cells Wet Prep HPF POC: NONE SEEN
Sperm: NONE SEEN
WBC, Wet Prep HPF POC: >10 — AB (ref <10)
Yeast Wet Prep HPF POC: NONE SEEN

## 2022-02-22 LAB — HEMOGLOBIN A1C
Hgb A1c MFr Bld: 5.3 % (ref 4.8–5.6)
Mean Plasma Glucose: 105.41 mg/dL

## 2022-02-22 LAB — LACTIC ACID, PLASMA
Lactic Acid, Venous: 0.6 mmol/L (ref 0.5–1.9)
Lactic Acid, Venous: 1.7 mmol/L (ref 0.5–1.9)

## 2022-02-22 LAB — HCG, QUANTITATIVE, PREGNANCY: hCG, Beta Chain, Quant, S: 110260 m[IU]/mL — ABNORMAL HIGH (ref <5)

## 2022-02-22 LAB — HIV ANTIBODY (ROUTINE TESTING W REFLEX): HIV Screen 4th Generation wRfx: NONREACTIVE

## 2022-02-22 MED ORDER — POTASSIUM CHLORIDE CRYS ER 20 MEQ PO TBCR
40.0000 meq | EXTENDED_RELEASE_TABLET | Freq: Every day | ORAL | Status: DC
  Administered 2022-02-23: 40 meq via ORAL
  Filled 2022-02-22 (×2): qty 2

## 2022-02-22 MED ORDER — POTASSIUM CHLORIDE 10 MEQ/100ML IV SOLN
10.0000 meq | INTRAVENOUS | Status: AC
  Administered 2022-02-22: 10 meq via INTRAVENOUS
  Filled 2022-02-22 (×2): qty 100

## 2022-02-22 MED ORDER — METRONIDAZOLE 500 MG/100ML IV SOLN
500.0000 mg | Freq: Once | INTRAVENOUS | Status: AC
  Administered 2022-02-22: 500 mg via INTRAVENOUS
  Filled 2022-02-22: qty 100

## 2022-02-22 MED ORDER — SODIUM CHLORIDE 0.9 % IV SOLN
500.0000 mg | INTRAVENOUS | Status: DC
  Administered 2022-02-23: 500 mg via INTRAVENOUS
  Filled 2022-02-22: qty 500

## 2022-02-22 MED ORDER — SODIUM CHLORIDE 0.9 % IV SOLN
100.0000 mg | Freq: Once | INTRAVENOUS | Status: DC
  Filled 2022-02-22: qty 100

## 2022-02-22 MED ORDER — METRONIDAZOLE 500 MG/100ML IV SOLN
500.0000 mg | Freq: Two times a day (BID) | INTRAVENOUS | Status: DC
  Administered 2022-02-22 – 2022-02-23 (×3): 500 mg via INTRAVENOUS
  Filled 2022-02-22 (×3): qty 100

## 2022-02-22 MED ORDER — ACETAMINOPHEN 325 MG PO TABS
ORAL_TABLET | ORAL | Status: AC
  Administered 2022-02-22: 650 mg via ORAL
  Filled 2022-02-22: qty 2

## 2022-02-22 MED ORDER — SODIUM CHLORIDE 0.9 % IV BOLUS
1000.0000 mL | Freq: Once | INTRAVENOUS | Status: AC
  Administered 2022-02-22: 1000 mL via INTRAVENOUS

## 2022-02-22 MED ORDER — SODIUM CHLORIDE 0.9 % IV SOLN
1.0000 g | INTRAVENOUS | Status: DC
  Administered 2022-02-23: 1 g via INTRAVENOUS
  Filled 2022-02-22: qty 1

## 2022-02-22 MED ORDER — NICOTINE POLACRILEX 2 MG MT GUM
2.0000 mg | CHEWING_GUM | OROMUCOSAL | Status: DC | PRN
  Administered 2022-02-22: 2 mg via ORAL
  Filled 2022-02-22 (×3): qty 1

## 2022-02-22 MED ORDER — ONDANSETRON HCL 4 MG/2ML IJ SOLN: 4.0000 mg | Freq: Four times a day (QID) | INTRAMUSCULAR | Status: DC | PRN

## 2022-02-22 MED ORDER — ACETAMINOPHEN 500 MG PO TABS: 1000.0000 mg | ORAL_TABLET | Freq: Four times a day (QID) | ORAL | Status: DC | PRN

## 2022-02-22 MED ORDER — LACTATED RINGERS IV SOLN: INTRAVENOUS | Status: DC

## 2022-02-22 MED ORDER — ONDANSETRON HCL 4 MG PO TABS: 4.0000 mg | ORAL_TABLET | Freq: Four times a day (QID) | ORAL | Status: DC | PRN

## 2022-02-22 MED ORDER — POTASSIUM CHLORIDE CRYS ER 20 MEQ PO TBCR
40.0000 meq | EXTENDED_RELEASE_TABLET | Freq: Once | ORAL | Status: AC
  Administered 2022-02-22: 40 meq via ORAL
  Filled 2022-02-22: qty 2

## 2022-02-22 MED ORDER — POTASSIUM CHLORIDE 10 MEQ/100ML IV SOLN
10.0000 meq | Freq: Once | INTRAVENOUS | Status: AC
  Administered 2022-02-22: 10 meq via INTRAVENOUS

## 2022-02-22 MED ORDER — SODIUM CHLORIDE 0.9 % IV SOLN
1.0000 g | Freq: Once | INTRAVENOUS | Status: AC
  Administered 2022-02-22: 1 g via INTRAVENOUS
  Filled 2022-02-22: qty 10

## 2022-02-22 MED ORDER — SODIUM CHLORIDE 0.9 % IV SOLN
500.0000 mg | Freq: Once | INTRAVENOUS | Status: AC
  Administered 2022-02-22: 500 mg via INTRAVENOUS
  Filled 2022-02-22: qty 5

## 2022-02-22 MED ORDER — ACETAMINOPHEN 325 MG PO TABS: 650.0000 mg | ORAL_TABLET | Freq: Once | ORAL | Status: AC | PRN

## 2022-02-22 MED ORDER — SODIUM CHLORIDE 0.9 % IV BOLUS
2000.0000 mL | Freq: Once | INTRAVENOUS | Status: AC
  Administered 2022-02-22: 2000 mL via INTRAVENOUS

## 2022-02-22 MED ORDER — SODIUM CHLORIDE 0.9% FLUSH: 3.0000 mL | Freq: Two times a day (BID) | INTRAVENOUS | Status: DC

## 2022-02-22 NOTE — ED Notes (Signed)
Transfer of care report called to Ralene Muskrat RN.  Pt current status, medications received, Dx, and Sx reported.  All questions asked were answered.

## 2022-02-22 NOTE — ED Provider Notes (Signed)
[facility] Provider Note    Event Date/Time   First MD Initiated Contact with Patient 02/22/22 0038     (approximate)   History   Abdominal Pain   HPI  Susan [name] is a 26 y.o. female who presents to the ED for evaluation of Abdominal Pain   G5 P2-1-1-2  Patient presents to the ED for evaluation of fever, pelvic pain and vaginal discharge.  Reports only being sick today, but is a poor historian.  No prenatal care during this gestation and reports her last menstrual period was sometime in June.  Uncertain her exact LMP or gestational age.  She reports continued cigarette smoking of 1 PPD throughout this gestation.  Reports "a lot of stress at home."   Physical Exam   Triage Vital Signs: ED Triage Vitals [02/22/22 0005]  Enc Vitals Group     BP (!) 135/92     Pulse Rate (!) 130     Resp (!) 22     Temp (!) 103.2 F (39.6 C)     Temp Source Oral     SpO2 100 %     Weight 170 lb (77.1 kg)     Height 5\' 5"  (1.651 m)     Head Circumference      Peak Flow      Pain Score 9     Pain Loc      Pain Edu?      Excl. in GC?     Most recent vital signs: Vitals:   02/22/22 0243 02/22/22 0330  BP: 138/81 (!) 151/87  Pulse: (!) 146 (!) 142  Resp: (!) 22 16  Temp: 98.3 F (36.8 C)   SpO2: 99% 100%    General: Awake, no distress.  CV:  Good peripheral perfusion.  Resp:  Normal effort.  Abd:  No distention.  Midline suprapubic tenderness to palpation.  Upper abdomen is benign.  No discrete tenderness to the RLQ at McBurney's point. MSK:  No deformity noted.  Neuro:  No focal deficits appreciated. Other:  Chaperoned speculum pelvic exam with a closed cervical os yielding continuous flow of purulent material.  No bleeding.  Cervical motion tenderness is present.   ED Results / Procedures / Treatments   Labs (all labs ordered are listed, but only abnormal results are displayed) Labs Reviewed  WET PREP, GENITAL - Abnormal; Notable for the  following components:      Result Value   Trich, Wet Prep PRESENT (*)    WBC, Wet Prep HPF POC >10 (*)    All other components within normal limits  COMPREHENSIVE METABOLIC PANEL - Abnormal; Notable for the following components:   Sodium 134 (*)    Potassium 2.9 (*)    CO2 21 (*)    Glucose, Bld 160 (*)    Alkaline Phosphatase 155 (*)    All other components within normal limits  CBC WITH DIFFERENTIAL/PLATELET - Abnormal; Notable for the following components:   WBC 15.4 (*)    RDW 18.3 (*)    Neutro Abs 11.5 (*)    All other components within normal limits  URINALYSIS, ROUTINE W REFLEX MICROSCOPIC - Abnormal; Notable for the following components:   Color, Urine AMBER (*)    APPearance CLOUDY (*)    Hgb urine dipstick SMALL (*)    Ketones, ur 5 (*)    Leukocytes,Ua LARGE (*)    Bacteria, UA RARE (*)    All other components within normal limits  HCG, QUANTITATIVE, PREGNANCY - Abnormal; Notable for the following components:   hCG, Beta Chain, Quant, S 110,260 (*)    All other components within normal limits  CHLAMYDIA/NGC RT PCR ([facility] ONLY)            CULTURE, BLOOD (ROUTINE X 2)  CULTURE, BLOOD (ROUTINE X 2)  URINE CULTURE  LACTIC ACID, PLASMA  PROTIME-INR  LACTIC ACID, PLASMA  LACTIC ACID, PLASMA  URINE DRUG SCREEN, QUALITATIVE ([facility] ONLY)  POC URINE PREG, ED    EKG   RADIOLOGY CXR interpreted by me without evidence of acute cardiopulmonary pathology.  Official radiology report(s): US OB Limited  Result Date: 02/22/2022 CLINICAL DATA:  Right lower quadrant pain. EXAM: LIMITED OBSTETRIC ULTRASOUND COMPARISON:  January 09, 2022 FINDINGS: Number of Fetuses: 1 Heart Rate:  160 bpm Movement: Yes Presentation: Cephalic Placental Location: Anterior Previa: No Amniotic Fluid (Subjective):  Within normal limits. AFI: N/A cm FL: 1.44 cm 14 w  2 d MATERNAL FINDINGS: Cervix:  Appears closed. Uterus/Adnexae: No abnormality visualized. Other: A 0.9 cm x 0.9 cm x 1.5 cm anechoic area  is seen within the placenta. No abnormal flow is noted within this region on color Doppler evaluation. IMPRESSION: Single, viable intrauterine pregnancy at approximately 14 weeks and 2 days gestation by ultrasound evaluation. This exam is performed on an emergent basis and does not comprehensively evaluate fetal size, dating, or anatomy; follow-up complete OB US should be considered if further fetal assessment is warranted. Electronically Signed   By: Virgina Norfolk M.D.   On: 02/22/2022 03:29   DG Chest 2 View  Result Date: 02/22/2022 CLINICAL DATA:  Fevers and darkened urine, initial encounter EXAM: CHEST - 2 VIEW COMPARISON:  None Available. FINDINGS: The heart size and mediastinal contours are within normal limits. Both lungs are clear. The visualized skeletal structures are unremarkable. IMPRESSION: No active cardiopulmonary disease. Electronically Signed   By: Inez Catalina M.D.   On: 02/22/2022 00:51    PROCEDURES and INTERVENTIONS:  .1-3 Lead EKG Interpretation  Performed by: Vladimir Crofts, MD Authorized by: Vladimir Crofts, MD     Interpretation: abnormal     ECG rate:  122   ECG rate assessment: tachycardic     Rhythm: sinus tachycardia     Ectopy: none     Conduction: normal   .Critical Care  Performed by: Vladimir Crofts, MD Authorized by: Vladimir Crofts, MD   Critical care provider statement:    Critical care time (minutes):  30   Critical care time was exclusive of:  Separately billable procedures and treating other patients   Critical care was necessary to treat or prevent imminent or life-threatening deterioration of the following conditions:  Sepsis   Critical care was time spent personally by me on the following activities:  Development of treatment plan with patient or surrogate, discussions with consultants, evaluation of patient's response to treatment, examination of patient, ordering and review of laboratory studies, ordering and review of radiographic studies, ordering and  performing treatments and interventions, pulse oximetry, re-evaluation of patient's condition and review of old charts   Medications  potassium chloride 10 mEq in 100 mL IVPB (10 mEq Intravenous Not Given 02/22/22 0345)  potassium chloride 10 mEq in 100 mL IVPB (10 mEq Intravenous New Bag/Given 02/22/22 0347)  cefTRIAXone (ROCEPHIN) 1 g in sodium chloride 0.9 % 100 mL IVPB (has no administration in time range)  metroNIDAZOLE (FLAGYL) IVPB 500 mg (has no administration in time range)  acetaminophen (TYLENOL) tablet 1,000 mg (has  no administration in time range)  potassium chloride SA (KLOR-CON M) CR tablet 40 mEq (has no administration in time range)  sodium chloride flush (NS) 0.9 % injection 3 mL (has no administration in time range)  ondansetron (ZOFRAN) tablet 4 mg (has no administration in time range)    Or  ondansetron (ZOFRAN) injection 4 mg (has no administration in time range)  lactated ringers infusion (has no administration in time range)  acetaminophen (TYLENOL) tablet 650 mg (650 mg Oral Given 02/22/22 0014)  cefTRIAXone (ROCEPHIN) 1 g in sodium chloride 0.9 % 100 mL IVPB (0 g Intravenous Stopped 02/22/22 0207)  metroNIDAZOLE (FLAGYL) IVPB 500 mg (0 mg Intravenous Stopped 02/22/22 0312)  sodium chloride 0.9 % bolus 2,000 mL (2,000 mLs Intravenous New Bag/Given 02/22/22 0143)  potassium chloride SA (KLOR-CON M) CR tablet 40 mEq (40 mEq Oral Given 02/22/22 0143)  azithromycin (ZITHROMAX) 500 mg in sodium chloride 0.9 % 250 mL IVPB (0 mg Intravenous Stopped 02/22/22 0348)  sodium chloride 0.9 % bolus 1,000 mL (1,000 mLs Intravenous New Bag/Given 02/22/22 0346)     IMPRESSION / MDM / ASSESSMENT AND PLAN / ED COURSE  I reviewed the triage vital signs and the nursing notes.  Differential diagnosis includes, but is not limited to, sepsis, PID, appendicitis, diverticulitis, pyelonephritis, septic abortion  {Patient presents with symptoms of an acute illness or injury that is  potentially life-threatening.  26 year old G5 at 3-4 months gestation presents with evidence of sepsis possibly from PID.  She is febrile and tachycardic, but hemodynamically stable.  Blood work with leukocytosis.  Hypokalemia on the metabolic panel.  No severe sepsis or lactic acidosis.  Pelvic exam with gross purulence and cervical motion tenderness, so she was empirically provided treatment for PID.  I consult with OB/GYN, as below.  They agree to admit pending a pelvic ultrasound.  Pelvic ultrasound with IUP around [redacted] weeks gestation without complication such as TOA.  Obstetrics agrees to admit.  Clinical Course as of 02/22/22 0421  Sat Feb 22, 2022  0144 I consult with Margaretmary Eddy, CNM on call for OBGYN. She agrees to place admission orders pending pelvic u/s [DS]  0256 Repeat vitals with fever breaking, but persistent tachycardic.  Blood pressures remained stable.  EKG with a sinus tachycardia without dysrhythmia. [DS]  [phone] Obstetric midwife has evaluated the patient and is agreeable with plan of care and agrees to place admission orders. [DS]    Clinical Course User Index [DS] Delton Prairie, MD     FINAL CLINICAL IMPRESSION(S) / ED DIAGNOSES   Final diagnoses:  PID (acute pelvic inflammatory disease)     Rx / DC Orders   ED Discharge Orders     None        Note:  This document was prepared using Dragon voice recognition software and may include unintentional dictation errors.   Delton Prairie, MD 02/22/22 [phone]

## 2022-02-22 NOTE — Plan of Care (Signed)
Educated patient on plan of care along with her mother at bedside. Patient responding with nodding and not very interactive at this time. Denies any pain or vaginal discharge. Vitals stable and under call parameters. No chest pain/SOB. Her mother verbalized understanding of plan of care.   Problem: Education: Goal: Knowledge of General Education information will improve Description: Including pain rating scale, medication(s)/side effects and non-pharmacologic comfort measures Outcome: Progressing   Problem: Health Behavior/Discharge Planning: Goal: Ability to manage health-related needs will improve Outcome: Progressing   Problem: Clinical Measurements: Goal: Ability to maintain clinical measurements within normal limits will improve Outcome: Progressing Goal: Will remain free from infection Outcome: Progressing Goal: Diagnostic test results will improve Outcome: Progressing Goal: Respiratory complications will improve Outcome: Progressing Goal: Cardiovascular complication will be avoided Outcome: Progressing   Problem: Activity: Goal: Risk for activity intolerance will decrease Outcome: Progressing   Problem: Nutrition: Goal: Adequate nutrition will be maintained Outcome: Progressing   Problem: Coping: Goal: Level of anxiety will decrease Outcome: Progressing   Problem: Elimination: Goal: Will not experience complications related to bowel motility Outcome: Progressing Goal: Will not experience complications related to urinary retention Outcome: Progressing   Problem: Pain Managment: Goal: General experience of comfort will improve Outcome: Progressing   Problem: Safety: Goal: Ability to remain free from injury will improve Outcome: Progressing   Problem: Skin Integrity: Goal: Risk for impaired skin integrity will decrease Outcome: Progressing

## 2022-02-22 NOTE — Progress Notes (Addendum)
Pt requested to go outside to smoke. Educated it is best for patient to stay on unit for safety and she has Nicorette gum ordered or offered to call provider for other option. Pt declined and stated she would leave AMA if she did not just go smoke so mother wheeled pt off unit at this time.

## 2022-02-22 NOTE — ED Notes (Signed)
Provider D. Tamala Julian, MD made aware of pt's current HR. Repeat EKG done and given to MD. Pt talking in complete sentences at this time.

## 2022-02-22 NOTE — H&P (Signed)
HISTORY AND PHYSICAL NOTE  History of Present Illness: Susan [name] is a 26 y.o. [id number] at [redacted]w[redacted]d, with an EDC of 08/18/2022 based on [place] at [redacted]w[redacted]d, not c/w unknown LMP, admitted for maternal fever and suspected PID in pregnancy.  She presented to L&D with complaints of lower abdominal pain.  She believes her LMP was sometime in June but is unsure.  She has not established care for this pregnancy.  She was seen in the ED on 01/09/2022 and treated for Trichomonas and chlamydia.  She reports vaginal discharge of thick, yellow brown.  Reports malaise and abdominal pain started yesterday.  Pain is mostly lower abdominal towards the middle but she feels diffuse pain too.    Cramping: denies  LOF/SROM: denies  Vaginal bleeding: reports brown discharge   Factors complicating pregnancy:  No prenatal care  Substance use disorder in pregnancy History of preeclampsia with severe features  History of preterm delivery Tobacco use in pregnancy  Previous LTCS with successful VBAC  Prenatal care site:  No established care   Patient Active Problem List   Diagnosis Date Noted   Pelvic inflammatory disease (PID) 02/22/2022   Substance abuse affecting pregnancy in third trimester, antepartum ([place]) 04/29/2021   Abdominal pain during pregnancy in third trimester 04/27/2021   Prenatal care insufficient 04/27/2021   Preterm labor third trimester with preterm delivery third trimester 04/27/2021   Desires VBAC (vaginal birth after cesarean) trial 02/08/2021   Obesity affecting pregnancy in first trimester 04/25/2020   MDD (major depressive disorder), recurrent episode, severe ([place]) 12/02/2019   Tobacco use complicating pregnancy, first trimester 08/21/2017   Family history of SIDS (sudden infant death syndrome) 18-May-2017   Supervision of high risk pregnancy, antepartum 04/06/2017   History of pre-eclampsia 04/06/2017   Marijuana abuse 12/07/2016   Abnormal liver function tests 09/18/2016   Chronic headaches  09/18/2016    Past Medical History:  Diagnosis Date   ADHD (attention deficit hyperactivity disorder)    Depression    Preeclampsia, severe, third trimester    HELLP Syndrome    Past Surgical History:  Procedure Laterality Date   NO PAST SURGERIES      OB History  Gravida Para Term Preterm AB Living  5 3 2 1 1 2   SAB IAB Ectopic Multiple Live Births  1 0 0 0 3    # Outcome Date GA Lbr Len/2nd Weight Sex Delivery Anes PTL Lv  5 Current           4 Preterm 04/27/21 [redacted]w[redacted]d 01:55 / 00:07 2670 g F Vag-Spont None  LIV  3 Term 11/03/17 [redacted]w[redacted]d / 00:13 3550 g F Vag-Spont   LIV  2 Term 02/02/16 [redacted]w[redacted]d  3317 g M   N DEC     Complications: Preeclampsia, severe, third trimester  1 SAB             Social History:  reports that she has been smoking cigarettes. She has been smoking an average of .5 packs per day. She has never used smokeless tobacco. She reports current alcohol use. She reports current drug use. Frequency: 1.00 time per week. Drugs: Marijuana and Methamphetamines.  Family History: family history includes Diabetes in her father, mother, and paternal aunt; SIDS in her son.  No Known Allergies  (Not in a hospital admission)   Review of Systems  Constitutional:  Positive for chills, fever and malaise/fatigue.  Respiratory:  Negative for cough and shortness of breath.   Cardiovascular:  Positive for chest pain.  Gastrointestinal:  Positive for abdominal pain and nausea. Negative for heartburn and vomiting.  Genitourinary:  Positive for dysuria. Negative for flank pain, frequency and urgency.  Musculoskeletal:  Positive for back pain and myalgias.  Neurological:  Negative for dizziness and headaches.  Psychiatric/Behavioral:  Negative for depression. The patient is not nervous/anxious.     Physical Examination: Vitals:  BP (!) 151/87   Pulse (!) 142   Temp 98.3 F (36.8 C) (Oral)   Resp 16   Ht 5\' 5"  (1.651 m)   Wt 77.1 kg   LMP  (LMP Unknown)   SpO2 100%   BMI  28.29 kg/m  General: no acute distress.  HEENT: normocephalic, atraumatic Heart: regular rate & rhythm.  No murmurs/rubs/gallops Lungs: clear to auscultation bilaterally, normal respiratory effort Abdomen: soft, gravid, + uterine tenderness, no tenderness to palpation over RLQ, negative McBurney, negative Murphy Pelvic:   External: Normal external female genitalia  Vagina: moderate amount purulent discharge  Cervix: + CMT    Extremities: non-tender, symmetric, No edema bilaterally.  DTRs: 2+/2+  Neurologic: Alert & oriented x 3.   FHT:  160 bpm    Labs:  Results for orders placed or performed during the hospital encounter of 02/22/22 (from the past 24 hour(s))  Comprehensive metabolic panel   Collection Time: 02/22/22 12:17 AM  Result Value Ref Range   Sodium 134 (L) 135 - 145 mmol/L   Potassium 2.9 (L) 3.5 - 5.1 mmol/L   Chloride 104 98 - 111 mmol/L   CO2 21 (L) 22 - 32 mmol/L   Glucose, Bld 160 (H) 70 - 99 mg/dL   BUN 10 6 - 20 mg/dL   Creatinine, Ser 02/24/22 0.44 - 1.00 mg/dL   Calcium 9.5 8.9 - 5.40 mg/dL   Total Protein 7.8 6.5 - 8.1 g/dL   Albumin 3.7 3.5 - 5.0 g/dL   AST 21 15 - 41 U/L   ALT 29 0 - 44 U/L   Alkaline Phosphatase 155 (H) 38 - 126 U/L   Total Bilirubin 0.7 0.3 - 1.2 mg/dL   GFR, Estimated 08.6 >76 mL/min   Anion gap 9 5 - 15  Lactic acid, plasma   Collection Time: 02/22/22 12:17 AM  Result Value Ref Range   Lactic Acid, Venous 1.7 0.5 - 1.9 mmol/L  CBC with Differential   Collection Time: 02/22/22 12:17 AM  Result Value Ref Range   WBC 15.4 (H) 4.0 - 10.5 K/uL   RBC 4.67 3.87 - 5.11 MIL/uL   Hemoglobin 13.1 12.0 - 15.0 g/dL   HCT 02/24/22 50.9 - 32.6 %   MCV 85.7 80.0 - 100.0 fL   MCH 28.1 26.0 - 34.0 pg   MCHC 32.8 30.0 - 36.0 g/dL   RDW 71.2 (H) 45.8 - 09.9 %   Platelets 318 150 - 400 K/uL   nRBC 0.0 0.0 - 0.2 %   Neutrophils Relative % 76 %   Neutro Abs 11.5 (H) 1.7 - 7.7 K/uL   Lymphocytes Relative 19 %   Lymphs Abs 2.9 0.7 - 4.0 K/uL    Monocytes Relative 5 %   Monocytes Absolute 0.8 0.1 - 1.0 K/uL   Eosinophils Relative 0 %   Eosinophils Absolute 0.1 0.0 - 0.5 K/uL   Basophils Relative 0 %   Basophils Absolute 0.1 0.0 - 0.1 K/uL   Immature Granulocytes 0 %   Abs Immature Granulocytes 0.06 0.00 - 0.07 K/uL  Protime-INR   Collection Time: 02/22/22 12:17 AM  Result Value Ref  Range   Prothrombin Time 13.6 11.4 - 15.2 seconds   INR 1.1 0.8 - 1.2  Urinalysis, Routine w reflex microscopic Urine, Clean Catch   Collection Time: 02/22/22 12:17 AM  Result Value Ref Range   Color, Urine AMBER (A) YELLOW   APPearance CLOUDY (A) CLEAR   Specific Gravity, Urine 1.027 1.005 - 1.030   pH 5.0 5.0 - 8.0   Glucose, UA NEGATIVE NEGATIVE mg/dL   Hgb urine dipstick SMALL (A) NEGATIVE   Bilirubin Urine NEGATIVE NEGATIVE   Ketones, ur 5 (A) NEGATIVE mg/dL   Protein, ur NEGATIVE NEGATIVE mg/dL   Nitrite NEGATIVE NEGATIVE   Leukocytes,Ua LARGE (A) NEGATIVE   RBC / HPF 6-10 0 - 5 RBC/hpf   WBC, UA 21-50 0 - 5 WBC/hpf   Bacteria, UA RARE (A) NONE SEEN   Squamous Epithelial / LPF 6-10 0 - 5   Mucus PRESENT    Sperm, UA PRESENT   hCG, quantitative, pregnancy   Collection Time: 02/22/22 12:17 AM  Result Value Ref Range   hCG, Beta Chain, Quant, S 110,260 (H) <5 mIU/mL  Chlamydia/NGC rt PCR ([facility] only)   Collection Time: 02/22/22  1:04 AM   Specimen: Cervical/Vaginal swab  Result Value Ref Range   Specimen source GC/Chlam ENDOCERVICAL    Chlamydia Tr NOT DETECTED NOT DETECTED   N gonorrhoeae NOT DETECTED NOT DETECTED  Wet prep, genital   Collection Time: 02/22/22  1:04 AM   Specimen: Cervical/Vaginal swab  Result Value Ref Range   Yeast Wet Prep HPF POC NONE SEEN NONE SEEN   Trich, Wet Prep PRESENT (A) NONE SEEN   Clue Cells Wet Prep HPF POC NONE SEEN NONE SEEN   WBC, Wet Prep HPF POC >10 (A) <10   Sperm NONE SEEN     Imaging Studies: US OB Limited  Result Date: 02/22/2022 CLINICAL DATA:  Right lower quadrant pain. EXAM:  LIMITED OBSTETRIC ULTRASOUND COMPARISON:  January 09, 2022 FINDINGS: Number of Fetuses: 1 Heart Rate:  160 bpm Movement: Yes Presentation: Cephalic Placental Location: Anterior Previa: No Amniotic Fluid (Subjective):  Within normal limits. AFI: N/A cm FL: 1.44 cm 14 w  2 d MATERNAL FINDINGS: Cervix:  Appears closed. Uterus/Adnexae: No abnormality visualized. Other: A 0.9 cm x 0.9 cm x 1.5 cm anechoic area is seen within the placenta. No abnormal flow is noted within this region on color Doppler evaluation. IMPRESSION: Single, viable intrauterine pregnancy at approximately 14 weeks and 2 days gestation by ultrasound evaluation. This exam is performed on an emergent basis and does not comprehensively evaluate fetal size, dating, or anatomy; follow-up complete OB US should be considered if further fetal assessment is warranted. Electronically Signed   By: Aram Candela M.D.   On: 02/22/2022 03:29   DG Chest 2 View  Result Date: 02/22/2022 CLINICAL DATA:  Fevers and darkened urine, initial encounter EXAM: CHEST - 2 VIEW COMPARISON:  None Available. FINDINGS: The heart size and mediastinal contours are within normal limits. Both lungs are clear. The visualized skeletal structures are unremarkable. IMPRESSION: No active cardiopulmonary disease. Electronically Signed   By: Alcide Clever M.D.   On: 02/22/2022 00:51    Assessment and Plan: Patient Active Problem List   Diagnosis Date Noted   Pelvic inflammatory disease (PID) 02/22/2022   Substance abuse affecting pregnancy in third trimester, antepartum ([facility]) 04/29/2021   Abdominal pain during pregnancy in third trimester 04/27/2021   Prenatal care insufficient 04/27/2021   Preterm labor third trimester with preterm delivery third  trimester 04/27/2021   Desires VBAC (vaginal birth after cesarean) trial 02/08/2021   Obesity affecting pregnancy in first trimester 04/25/2020   MDD (major depressive disorder), recurrent episode, severe ([facility]) 12/02/2019    Tobacco use complicating pregnancy, first trimester 08/21/2017   Family history of SIDS (sudden infant death syndrome) 2017/05/02   Supervision of high risk pregnancy, antepartum 04/06/2017   History of pre-eclampsia 04/06/2017   Marijuana abuse 12/07/2016   Abnormal liver function tests 09/18/2016   Chronic headaches 09/18/2016    1. Admit to Antenatal -Discussed care with Dr. Jean Rosenthal -Viable IUP c/w dates seen on US  -Uterine tenderness and CMT noted with pelvic exam along with purulent vaginal/cervical discharge  -PID in pregnancy is rare, particularly in 2nd trimester.  She was treated for trichomonas and chlamydia 6 weeks ago and remains positive for trichomonas.   -She has risk factors for PID, uterine infection, and/or chorioamnionitis.   -Will presumptively treat for PID with Rocephin, Flagyl, and Azithromycin  -Tylenol every 6 hours for fever  -Continuous tele for maternal tachycardia  -FHT daily    Gustavo Lah, CNM  Certified Nurse Midwife [facility]

## 2022-02-22 NOTE — Consult Note (Signed)
NAME: Susan [name]  DOB: 1995/11/29  MRN: [id number]  Date/Time: 02/22/2022 6:32 PM  REQUESTING PROVIDER: Dr.Jackson Subjective:  REASON FOR CONSULT: PID in pregnancy ? Susan [name] is a 26 y.o. female with a history of chlamydia infection, preeclampsia , who is [redacted] weeks pregnant presented to the hospital midnight with abdominal cramping, dark vaginal discharge, low back pain and fever . The first three symptoms were going on for a couple of days- prior to that she had dizziness. This is her 4 h pregnancy Vitals on admission  02/22/22 00:05  BP 135/92 (H)  Temp 103.2 F (39.6 C) !  Pulse Rate 130 !  Resp 22 !  SpO2 100 %  Weight 170 lb    Latest Reference Range & Units 02/22/22 00:17  WBC 4.0 - 10.5 K/uL 15.4 (H)  Hemoglobin 12.0 - 15.0 g/dL 00.8  HCT 67.6 - 19.5 % 40.0  Platelets 150 - 400 K/uL 318  Creatinine 0.44 - 1.00 mg/dL 0.93   Pelvic examination by Ob revealed moderate purulent discharge, cervical movt tendernss She was diagnosed with PID- blood and urine culture sent- cervical swab neg for GC/cCHl Wet prep showed Trichomonas In aug she was diagnosed with chlamydia in the ED and received Ceftriaxone and azithromycin apparently as OP As she is [redacted] weeks pregnant I am asked to see her for PID management  Past Medical History:  Diagnosis Date   ADHD (attention deficit hyperactivity disorder)    Depression    Preeclampsia, severe, third trimester    HELLP Syndrome    Past Surgical History:  Procedure Laterality Date   NO PAST SURGERIES      Social History   Socioeconomic History   Marital status: Single    Spouse name: Not on file   Number of children: 0   Years of education: Not on file   Highest education level: Not on file  Occupational History   Not on file  Tobacco Use   Smoking status: Every Day    Packs/day: 0.50    Types: Cigarettes   Smokeless tobacco: Never  Vaping Use   Vaping Use: Never used  Substance and Sexual Activity   Alcohol  use: Yes    Comment: vodka   Drug use: Yes    Frequency: 1.0 times per week    Types: Marijuana, Methamphetamines   Sexual activity: Yes  Other Topics Concern   Not on file  Social History Narrative   Not on file   Social Determinants of Health   Financial Resource Strain: Not on file  Food Insecurity: Not on file  Transportation Needs: Not on file  Physical Activity: Not on file  Stress: Not on file  Social Connections: Not on file  Intimate Partner Violence: Not on file    Family History  Problem Relation Age of Onset   Diabetes Mother    Diabetes Father    SIDS Son    Diabetes Paternal Aunt    No Known Allergies I? Current Facility-Administered Medications  Medication Dose Route Frequency Provider Last Rate Last Admin   acetaminophen (TYLENOL) tablet 1,000 mg  1,000 mg Oral Q6H PRN Gustavo Lah, CNM       [START ON 02/23/2022] cefTRIAXone (ROCEPHIN) 1 g in sodium chloride 0.9 % 100 mL IVPB  1 g Intravenous Q24H Margaretmary Eddy M, CNM       lactated ringers infusion   Intravenous Continuous Gustavo Lah, [place] 125 mL/hr at 02/22/22 1651 Infusion Verify at 02/22/22 1651  metroNIDAZOLE (FLAGYL) IVPB 500 mg  500 mg Intravenous Q12H Gustavo Lah, CNM   Stopped at 02/22/22 1514   nicotine polacrilex (NICORETTE) gum 2 mg  2 mg Oral PRN McVey, Prudencio Pair, CNM   2 mg at 02/22/22 1318   ondansetron (ZOFRAN) tablet 4 mg  4 mg Oral Q6H PRN Gustavo Lah, CNM       Or   ondansetron [facility]) injection 4 mg  4 mg Intravenous Q6H PRN Gustavo Lah, CNM       [START ON 02/23/2022] potassium chloride SA (KLOR-CON M) CR tablet 40 mEq  40 mEq Oral Daily Margaretmary Eddy M, CNM       sodium chloride flush (NS) 0.9 % injection 3 mL  3 mL Intravenous Q12H Margaretmary Eddy M, CNM         Abtx:  Anti-infectives (From admission, onward)    Start     Dose/Rate Route Frequency Ordered Stop   02/23/22 0100  cefTRIAXone (ROCEPHIN) 1 g in sodium chloride 0.9 % 100 mL IVPB        1 g 200 mL/hr over  30 Minutes Intravenous Every 24 hours 02/22/22 0420     02/22/22 1400  metroNIDAZOLE (FLAGYL) IVPB 500 mg        500 mg 100 mL/hr over 60 Minutes Intravenous Every 12 hours 02/22/22 0420     02/22/22 0145  azithromycin (ZITHROMAX) 500 mg in sodium chloride 0.9 % 250 mL IVPB        500 mg 250 mL/hr over 60 Minutes Intravenous  Once 02/22/22 0138 02/22/22 0348   02/22/22 0115  cefTRIAXone (ROCEPHIN) 1 g in sodium chloride 0.9 % 100 mL IVPB        1 g 200 mL/hr over 30 Minutes Intravenous  Once 02/22/22 0113 02/22/22 0207   02/22/22 0115  doxycycline (VIBRAMYCIN) 100 mg in sodium chloride 0.9 % 250 mL IVPB  Status:  Discontinued        100 mg 125 mL/hr over 120 Minutes Intravenous  Once 02/22/22 0113 02/22/22 0138   02/22/22 0115  metroNIDAZOLE (FLAGYL) IVPB 500 mg        500 mg 100 mL/hr over 60 Minutes Intravenous  Once 02/22/22 0113 02/22/22 4034       REVIEW OF SYSTEMS:  Const:  fever,  chills, negative weight loss Eyes: negative diplopia or visual changes, negative eye pain ENT: negative coryza, negative sore throat Resp: negative cough, hemoptysis, dyspnea Cards: negative for chest pain, palpitations, lower extremity edema GU:  dysuria  Dark vaginal discharge GI:  abdominal pain, no diarrhea, bleeding, constipation Skin: negative for rash and pruritus Heme: negative for easy bruising and gum/nose bleeding MS: weakness Neurolo:dizziness and headache Psych:  anxiety Substance use Endocrine: negative for thyroid, diabetes Allergy/Immunology- negative for any medication or food allergies ?  Objective:  VITALS:  BP 136/83 (BP Location: Left Arm)   Pulse (!) 122   Temp 98.4 F (36.9 C) (Oral)   Resp 20   Ht 5\' 5"  (1.651 m)   Wt 77.1 kg   LMP  (LMP Unknown)   SpO2 99%   BMI 28.29 kg/m   PHYSICAL EXAM:  General: Alert, cooperative, no distress, appears stated age.  Head: Normocephalic, without obvious abnormality, atraumatic. Eyes: Conjunctivae clear, anicteric  sclerae. Pupils are equal ENT Nares normal. No drainage or sinus tenderness. Lips, mucosa, and tongue normal. No Thrush Neck: Supple, symmetrical, no adenopathy, thyroid: non tender no carotid bruit and no JVD. Back: No CVA  tenderness. Lungs: Clear to auscultation bilaterally. No Wheezing or Rhonchi. No rales. Heart: Regular rate and rhythm, no murmur, rub or gallop. Abdomen: Soft, non-tender,not distended. Bowel sounds normal. No masses Extremities: atraumatic, no cyanosis. No edema. No clubbing Skin: No rashes or lesions. Or bruising Lymph: Cervical, supraclavicular normal. Neurologic: Grossly non-focal Pertinent Labs Lab Results CBC    Component Value Date/Time   WBC 15.4 (H) 02/22/2022 0017   RBC 4.67 02/22/2022 0017   HGB 13.1 02/22/2022 0017   HGB 13.4 04/25/2020 1108   HCT 40.0 02/22/2022 0017   HCT 39.1 04/25/2020 1108   PLT 318 02/22/2022 0017   PLT 270 04/25/2020 1108   MCV 85.7 02/22/2022 0017   MCV 94 04/25/2020 1108   MCV 96 01/17/2014 1240   MCH 28.1 02/22/2022 0017   MCHC 32.8 02/22/2022 0017   RDW 18.3 (H) 02/22/2022 0017   RDW 12.3 04/25/2020 1108   RDW 13.0 01/17/2014 1240   LYMPHSABS 2.9 02/22/2022 0017   LYMPHSABS 2.6 08/21/2017 1128   MONOABS 0.8 02/22/2022 0017   EOSABS 0.1 02/22/2022 0017   EOSABS 0.1 08/21/2017 1128   BASOSABS 0.1 02/22/2022 0017   BASOSABS 0.0 08/21/2017 1128       Latest Ref Rng & Units 02/22/2022   12:17 AM 01/08/2022   11:55 PM 01/01/2022    3:13 AM  CMP  Glucose 70 - 99 mg/dL 219  758  92   BUN 6 - 20 mg/dL 10  10  14    Creatinine 0.44 - 1.00 mg/dL  8.32  5.49   Sodium 135 - 145 mmol/L 134  138  134   Potassium 3.5 - 5.1 mmol/L 2.9  2.9  4.1   Chloride 98 - 111 mmol/L 104  105  107   CO2 22 - 32 mmol/L 21  24  20    Calcium 8.9 - 10.3 mg/dL 9.5  9.3  9.1   Total Protein 6.5 - 8.1 g/dL 7.8  7.5  7.2   Total Bilirubin 0.3 - 1.2 mg/dL 0.7  0.9  0.9   Alkaline Phos 38 - 126 U/L 155  132  104   AST 15 - 41 U/L 21   24  25    ALT 0 - 44 U/L 29  61  35       Microbiology: Recent Results (from the past 240 hour(s))  Culture, blood (Routine x 2)     Status: None (Preliminary result)   Collection Time: 02/22/22 12:17 AM   Specimen: BLOOD  Result Value Ref Range Status   Specimen Description BLOOD BLOOD RIGHT ARM  Final   Special Requests   Final    BOTTLES DRAWN AEROBIC AND ANAEROBIC Blood Culture adequate volume   Culture   Final    NO GROWTH < 12 HOURS Performed at [facility], [address] [place], [facility] [address]    Report Status PENDING  Incomplete  Culture, blood (Routine x 2)     Status: None (Preliminary result)   Collection Time: 02/22/22 12:30 AM   Specimen: BLOOD  Result Value Ref Range Status   Specimen Description BLOOD LEFT ANTECUBITAL  Final   Special Requests   Final    BOTTLES DRAWN AEROBIC ONLY Blood Culture adequate volume   Culture   Final    NO GROWTH < 12 HOURS Performed at [facility], [address] [place], [facility] [address]    Report Status PENDING  Incomplete  Chlamydia/NGC rt PCR ([facility] only)  Status: None   Collection Time: 02/22/22  1:04 AM   Specimen: Cervical/Vaginal swab  Result Value Ref Range Status   Specimen source GC/Chlam ENDOCERVICAL  Final   Chlamydia Tr NOT DETECTED NOT DETECTED Final   N gonorrhoeae NOT DETECTED NOT DETECTED Final    Comment: (NOTE) This CT/NG assay has not been evaluated in patients with a history of  hysterectomy. Performed at [facility], [address] [place], [place] [address]   Wet prep, genital     Status: Abnormal   Collection Time: 02/22/22  1:04 AM   Specimen: Cervical/Vaginal swab  Result Value Ref Range Status   Yeast Wet Prep HPF POC NONE SEEN NONE SEEN Final   Trich, Wet Prep PRESENT (A) NONE SEEN Final   Clue Cells Wet Prep HPF POC NONE SEEN NONE SEEN Final   WBC, Wet Prep HPF POC >10 (A) <10 Final   Sperm NONE SEEN  Final    Comment: Performed at [facility], [address] [place], [place] [address]  02/22/22 BC UC  pending  [place] A 0.9 cm x 0.9 cm x 1.5 cm anechoic area is seen within the placenta. No abnormal flow is noted within this region on color. Doppler evaluation. IMAGING RESULTS: CXR - normal I have personally reviewed the films ? Impression/Recommendation ? ?[redacted] week pregnant female presenting with fever, abdominal and back pain, vaginal discharge, dysuria, cervical motion tenderness PID Trichomonas Recent chlamydia infection in Aug-but neg now Need to r/o UTI Currently on ceftriaxone, metronidazole Also got a dose of 500mg  azithromycin IV Will give another dose of azithromycin 500mg  tomorrow Continue CTX and Metronidazole If no UTI then would treat like PID and on discharge would need azithromycin 250mg  once a day for 10 days along with metronidazole 500mg  PO TID for 12 more days IF the UC is positive will have to be treated accordingly Doxy cannot be used because of pregnancy  Her partner will have to be treated for trich and chlamydia ( if he was not treated before)  Substance use- positive tox screen     ? ___________________________________________________ Discussed with patient, and her mom and requesting provider Note:  This document was prepared using Dragon voice recognition software and may include unintentional dictation errors.

## 2022-02-22 NOTE — ED Triage Notes (Signed)
Pt states she is having lower abd cramping, "dark urine", vomiting, difficulty keeping po fluids down. Pt states she feels like she is also having an intermittent fever. Pt states she thinks she is also 3-4 months pregnant.

## 2022-02-22 NOTE — Progress Notes (Signed)
Daily Antepartum Note  Admission Date: 02/22/2022 Current Date: 02/22/2022 1:58 PM  Susan [name] is a 26 y.o. [id number] @ [redacted]w[redacted]d by 14 week ultrasound, HD#1, admitted for suspected PID in the setting of pregnancy.  Pregnancy complicated by: Patient Active Problem List   Diagnosis Date Noted   Pelvic inflammatory disease (PID) 02/22/2022   Substance abuse affecting pregnancy in third trimester, antepartum (HCC) 04/29/2021   Abdominal pain during pregnancy in third trimester 04/27/2021   Prenatal care insufficient 04/27/2021   Chief complaint: abdominal pain, spottting  Overnight/24hr events:  No acute events. Feeling somewhat better.  Subjective:  Denies current fever, chills, chest pain, trouble breathing, vaginal bleeding. She does not mild cramping. She really wants to leave the hospital since she is bored and feels better.  She states that her heart rate is always over 100.    Objective:   Vitals:   02/22/22 0758 02/22/22 1159  BP: (!) 139/90 136/83  Pulse: (!) 110 (!) 119  Resp: 20 18  Temp: 98.7 F (37.1 C) 98.4 F (36.9 C)  SpO2: 100% 98%   Temp:  [98.3 F (36.8 C)-103.2 F (39.6 C)] 98.4 F (36.9 C) (10/14 1159) Pulse Rate:  [110-146] 119 (10/14 1159) Resp:  [16-22] 18 (10/14 1159) BP: (135-151)/(81-92) 136/83 (10/14 1159) SpO2:  [98 %-100 %] 98 % (10/14 1159) Weight:  [77.1 kg] 77.1 kg (10/14 0005) Temp (24hrs), Avg:99.5 F (37.5 C), Min:98.3 F (36.8 C), Max:103.2 F (39.6 C)  No intake or output data in the 24 hours ending 02/22/22 1358   Current Vital Signs 24h Vital Sign Ranges  T 98.4 F (36.9 C) Temp  Avg: 99.5 F (37.5 C)  Min: 98.3 F (36.8 C)  Max: 103.2 F (39.6 C)  BP 136/83 BP  Min: 135/92  Max: 151/87  HR (!) 119 Pulse  Avg: 129.5  Min: 110  Max: 146  RR 18 Resp  Avg: 20  Min: 16  Max: 22  SaO2 98 % Room Air SpO2  Avg: 99.5 %  Min: 98 %  Max: 100 %       24 Hour I/O Current Shift I/O  Time Ins Outs No intake/output data  recorded. No intake/output data recorded.   Patient Vitals for the past 24 hrs:  BP Temp Temp src Pulse Resp SpO2 Height Weight  02/22/22 1159 136/83 98.4 F (36.9 C) Oral (!) 119 18 98 % -- --  02/22/22 0758 (!) 139/90 98.7 F (37.1 C) Oral (!) 110 20 100 % -- --  02/22/22 0600 (!) 150/82 99.1 F (37.3 C) Oral (!) 130 (!) 22 100 % -- --  02/22/22 0330 (!) 151/87 -- -- (!) 142 16 100 % -- --  02/22/22 0243 138/81 98.3 F (36.8 C) Oral (!) 146 (!) 22 99 % -- --  02/22/22 0005 (!) 135/92 (!) 103.2 F (39.6 C) Oral (!) 130 (!) 22 100 % 5\' 5"  (1.651 m) 77.1 kg    Fetal Heart Tones: 163 bpm today Tocometry: not done due to early getstional age  Physical exam: General: Well nourished, well developed female in no acute distress. Abdomen: gravid nontender Cardiovascular: tachycardic, rhythm regular Respiratory: CTAB Extremities: no clubbing, cyanosis or edema Skin: Warm and dry.   Medications: Current Facility-Administered Medications  Medication Dose Route Frequency Provider Last Rate Last Admin   acetaminophen (TYLENOL) tablet 1,000 mg  1,000 mg Oral Q6H PRN , CNM       [START ON 02/23/2022] cefTRIAXone (ROCEPHIN) 1 g  in sodium chloride 0.9 % 100 mL IVPB  1 g Intravenous Q24H Drinda Butts M, CNM       lactated ringers infusion   Intravenous Continuous Minda Meo, [place] 125 mL/hr at 02/22/22 1093 New Bag at 02/22/22 2355   metroNIDAZOLE (FLAGYL) IVPB 500 mg  500 mg Intravenous Q12H Drinda Butts M, CNM       nicotine polacrilex (NICORETTE) gum 2 mg  2 mg Oral PRN McVey, Murray Hodgkins, CNM   2 mg at 02/22/22 1318   ondansetron (ZOFRAN) tablet 4 mg  4 mg Oral Q6H PRN Minda Meo, CNM       Or   ondansetron [facility]) injection 4 mg  4 mg Intravenous Q6H PRN Minda Meo, CNM       [START ON 02/23/2022] potassium chloride SA (KLOR-CON M) CR tablet 40 mEq  40 mEq Oral Daily Drinda Butts M, CNM       sodium chloride flush (NS) 0.9 % injection 3 mL  3 mL Intravenous Q12H  Minda Meo, CNM        Labs:  Recent Labs  Lab 02/22/22 0017  WBC 15.4*  HGB 13.1  HCT 40.0  PLT 318    Recent Labs  Lab 02/22/22 0017  NA 134*  K 2.9*  CL 104  CO2 21*  BUN 10  CREATININE 0.62  CALCIUM 9.5  PROT 7.8  BILITOT 0.7  ALKPHOS 155*  ALT 29  AST 21  GLUCOSE 160*     Radiology:  US OB Limited  Result Date: 02/22/2022 CLINICAL DATA:  Right lower quadrant pain. EXAM: LIMITED OBSTETRIC ULTRASOUND COMPARISON:  January 09, 2022 FINDINGS: Number of Fetuses: 1 Heart Rate:  160 bpm Movement: Yes Presentation: Cephalic Placental Location: Anterior Previa: No Amniotic Fluid (Subjective):  Within normal limits. AFI: N/A cm FL: 1.44 cm 14 w  2 d MATERNAL FINDINGS: Cervix:  Appears closed. Uterus/Adnexae: No abnormality visualized. Other: A 0.9 cm x 0.9 cm x 1.5 cm anechoic area is seen within the placenta. No abnormal flow is noted within this region on color Doppler evaluation. IMPRESSION: Single, viable intrauterine pregnancy at approximately 14 weeks and 2 days gestation by ultrasound evaluation. This exam is performed on an emergent basis and does not comprehensively evaluate fetal size, dating, or anatomy; follow-up complete OB US should be considered if further fetal assessment is warranted. Electronically Signed   By: Virgina Norfolk M.D.   On: 02/22/2022 03:29   DG Chest 2 View  Result Date: 02/22/2022 CLINICAL DATA:  Fevers and darkened urine, initial encounter EXAM: CHEST - 2 VIEW COMPARISON:  None Available. FINDINGS: The heart size and mediastinal contours are within normal limits. Both lungs are clear. The visualized skeletal structures are unremarkable. IMPRESSION: No active cardiopulmonary disease. Electronically Signed   By: Inez Catalina M.D.   On: 02/22/2022 00:51     Assessment & Plan:  26 y.o. [id number] female at [redacted]w[redacted]d with suspected PID.    Discussed findings with her from last ER visit. She states that she was called in oral medication for one dose  which she took. She was also called in two "creams."  She is a poor historian and states she does not like creams, but wouldn't really comment on whether she used them or not.   *Pregnancy:PNV, daily heart tones, SCDs * ID: Currently on Ceftriaxone, metronidazole.  ID consult due to possible PID in pregnancy -CDC recommends consultation with Infectious Disease specialist. I've already contacted Dr. Delaine Lame.  HIV and RPR testing ordered. Being collected as I left the room.  *Preterm: no current issues. Spotting of brown blood resolved.  Fetal status reassuring on ultrasound last night. *PPx: SCDs *FEN/GI: diet as tolerated. *Dispo: once meeting discharge criteria.  She is at high risk of maternal and fetal complications, including sepsis, death of mother and/or fetus, given the potential severity of the infection.  I explained all this to her and implored her to stay and let us get her to a stable state. I explained to her that we would involve the Infectious Disease specialist. She is ok with this.   Thomasene Mohair, MD, [facility] 02/22/2022 2:07 PM

## 2022-02-22 NOTE — Progress Notes (Signed)
ANTEPARTUM PROGRESS NOTE  Susan [name] is a 26 y.o. [id number] at [redacted]w[redacted]d with [facility] of Estimated Date of Delivery: 08/21/22 who is admitted for  Length of Stay:  0 Days. Admitted 02/22/2022  Subjective: Patient reports craving nicotine, she usually smokes 1ppd cigarettes.  She reports no bleeding and no loss of fluid per vagina. She reports taht she feels better this morning than when she was admitted.   Vitals:  BP 136/83 (BP Location: Left Arm)   Pulse (!) 119   Temp 98.4 F (36.9 C) (Oral)   Resp 18   Ht 5\' 5"  (1.651 m)   Wt 77.1 kg   LMP  (LMP Unknown)   SpO2 98%   BMI 28.29 kg/m   Physical Examination: CONSTITUTIONAL: Well-developed, well-nourished female in no acute distress.  HENT:  Normocephalic, atraumatic EYES: Conjunctivae and EOM are normal.  SKIN: Skin is warm and dry.  NEUROLGIC: Alert and oriented to person, place, and time.  PSYCHIATRIC: Normal mood and affect. Normal behavior.  CARDIOVASCULAR: Normal heart rate noted, regular rhythm RESPIRATORY: Effort and breath sounds normal MUSCULOSKELETAL: Normal range of motion. No edema and no tenderness. ABDOMEN: Soft, nontender, nondistended, gravid.  Uterus: soft, but notably tender to palpation.  Fetal monitoring: FHR: 160s bpm   Results for orders placed or performed during the hospital encounter of 02/22/22 (from the past 48 hour(s))  Comprehensive metabolic panel     Status: Abnormal   Collection Time: 02/22/22 12:17 AM  Result Value Ref Range   Sodium 134 (L) 135 - 145 mmol/L   Potassium 2.9 (L) 3.5 - 5.1 mmol/L   Chloride 104 98 - 111 mmol/L   CO2 21 (L) 22 - 32 mmol/L   Glucose, Bld 160 (H) 70 - 99 mg/dL    Comment: Glucose reference range applies only to samples taken after fasting for at least 8 hours.   BUN 10 6 - 20 mg/dL   Creatinine, Ser 02/24/22 0.44 - 1.00 mg/dL   Calcium 9.5 8.9 - 1.24 mg/dL   Total Protein 7.8 6.5 - 8.1 g/dL   Albumin 3.7 3.5 - 5.0 g/dL   AST 21 15 - 41 U/L   ALT 29 0 - 44 U/L    Alkaline Phosphatase 155 (H) 38 - 126 U/L   Total Bilirubin 0.7 0.3 - 1.2 mg/dL   GFR, Estimated 58.0 >99 mL/min    Comment: (NOTE) Calculated using the CKD-EPI Creatinine Equation (2021)    Anion gap 9 5 - 15    Comment: Performed at [facility], [address] [place], [place]  Lactic acid, plasma     Status: None   Collection Time: 02/22/22 12:17 AM  Result Value Ref Range   Lactic Acid, Venous 1.7 0.5 - 1.9 mmol/L    Comment: Performed at [facility], [address] [place], [place]  CBC with Differential     Status: Abnormal   Collection Time: 02/22/22 12:17 AM  Result Value Ref Range   WBC 15.4 (H) 4.0 - 10.5 K/uL   RBC 4.67 3.87 - 5.11 MIL/uL   Hemoglobin 13.1 12.0 - 15.0 g/dL   HCT 02/24/22 73.4 - 19.3 %   MCV 85.7 80.0 - 100.0 fL   MCH 28.1 26.0 - 34.0 pg   MCHC 32.8 30.0 - 36.0 g/dL   RDW 79.0 (H) 24.0 - 97.3 %   Platelets 318 150 - 400 K/uL   nRBC 0.0 0.0 - 0.2 %   Neutrophils Relative %  76 %   Neutro Abs 11.5 (H) 1.7 - 7.7 K/uL   Lymphocytes Relative 19 %   Lymphs Abs 2.9 0.7 - 4.0 K/uL   Monocytes Relative 5 %   Monocytes Absolute 0.8 0.1 - 1.0 K/uL   Eosinophils Relative 0 %   Eosinophils Absolute 0.1 0.0 - 0.5 K/uL   Basophils Relative 0 %   Basophils Absolute 0.1 0.0 - 0.1 K/uL   Immature Granulocytes 0 %   Abs Immature Granulocytes 0.06 0.00 - 0.07 K/uL    Comment: Performed at [facility], [address] [place], [place] [address]  Protime-INR     Status: None   Collection Time: 02/22/22 12:17 AM  Result Value Ref Range   Prothrombin Time 13.6 11.4 - 15.2 seconds   INR 1.1 0.8 - 1.2    Comment: (NOTE) INR goal varies based on device and disease states. Performed at [facility], [address] [place], [place] [address]   Culture, blood (Routine x 2)     Status: None (Preliminary result)   Collection Time: 02/22/22 12:17 AM   Specimen: BLOOD  Result Value Ref Range   Specimen  Description BLOOD BLOOD RIGHT ARM    Special Requests      BOTTLES DRAWN AEROBIC AND ANAEROBIC Blood Culture adequate volume   Culture      NO GROWTH < 12 HOURS Performed at [facility], [address], [place], [place] [address]    Report Status PENDING   Urinalysis, Routine w reflex microscopic Urine, Clean Catch     Status: Abnormal   Collection Time: 02/22/22 12:17 AM  Result Value Ref Range   Color, Urine AMBER (A) YELLOW    Comment: BIOCHEMICALS MAY BE AFFECTED BY COLOR   APPearance CLOUDY (A) CLEAR   Specific Gravity, Urine 1.027 1.005 - 1.030   pH 5.0 5.0 - 8.0   Glucose, UA NEGATIVE NEGATIVE mg/dL   Hgb urine dipstick SMALL (A) NEGATIVE   Bilirubin Urine NEGATIVE NEGATIVE   Ketones, ur 5 (A) NEGATIVE mg/dL   Protein, ur NEGATIVE NEGATIVE mg/dL   Nitrite NEGATIVE NEGATIVE   Leukocytes,Ua LARGE (A) NEGATIVE   RBC / HPF 6-10 0 - 5 RBC/hpf   WBC, UA 21-50 0 - 5 WBC/hpf   Bacteria, UA RARE (A) NONE SEEN   Squamous Epithelial / LPF 6-10 0 - 5   Mucus PRESENT    Sperm, UA PRESENT     Comment: Performed at [facility], [address] [place], [place] [address]  hCG, quantitative, pregnancy     Status: Abnormal   Collection Time: 02/22/22 12:17 AM  Result Value Ref Range   hCG, Beta Chain, Quant, S 110,260 (H) <5 mIU/mL    Comment:          GEST. AGE      CONC.  (mIU/mL)   <=1 WEEK        5 - 50     2 WEEKS       50 - 500     3 WEEKS       100 - 10,000     4 WEEKS     1,000 - 30,000     5 WEEKS     3,500 - 115,000   6-8 WEEKS     12,000 - 270,000    12 WEEKS     15,000 - 220,000        FEMALE AND NON-PREGNANT FEMALE:     LESS THAN  5 mIU/mL Performed at [facility], [address] [place], [place] [address]   Culture, blood (Routine x 2)     Status: None (Preliminary result)   Collection Time: 02/22/22 12:30 AM   Specimen: BLOOD  Result Value Ref Range   Specimen Description BLOOD LEFT ANTECUBITAL    Special Requests      BOTTLES  DRAWN AEROBIC ONLY Blood Culture adequate volume   Culture      NO GROWTH < 12 HOURS Performed at [facility], [address], [place], [place] [address]    Report Status PENDING   Chlamydia/NGC rt PCR ([facility] only)     Status: None   Collection Time: 02/22/22  1:04 AM   Specimen: Cervical/Vaginal swab  Result Value Ref Range   Specimen source GC/Chlam ENDOCERVICAL    Chlamydia Tr NOT DETECTED NOT DETECTED   N gonorrhoeae NOT DETECTED NOT DETECTED    Comment: (NOTE) This CT/NG assay has not been evaluated in patients with a history of  hysterectomy. Performed at [facility], [address] [place], [place] [address]   Wet prep, genital     Status: Abnormal   Collection Time: 02/22/22  1:04 AM   Specimen: Cervical/Vaginal swab  Result Value Ref Range   Yeast Wet Prep HPF POC NONE SEEN NONE SEEN   Trich, Wet Prep PRESENT (A) NONE SEEN   Clue Cells Wet Prep HPF POC NONE SEEN NONE SEEN   WBC, Wet Prep HPF POC >10 (A) <10   Sperm NONE SEEN     Comment: Performed at [facility], [address], [place], [place] [address]  Urine Drug Screen, Qualitative ([facility] only)     Status: Abnormal   Collection Time: 02/22/22  6:00 AM  Result Value Ref Range   Tricyclic, Ur Screen NONE DETECTED NONE DETECTED   Amphetamines, Ur Screen POSITIVE (A) NONE DETECTED   MDMA (Ecstasy)Ur Screen NONE DETECTED NONE DETECTED   Cocaine Metabolite,Ur [place] NONE DETECTED NONE DETECTED   Opiate, Ur Screen NONE DETECTED NONE DETECTED   Phencyclidine (PCP) Ur S NONE DETECTED NONE DETECTED   Cannabinoid 50 Ng, Ur [place] POSITIVE (A) NONE DETECTED   Barbiturates, Ur Screen NONE DETECTED NONE DETECTED   Benzodiazepine, Ur Scrn NONE DETECTED NONE DETECTED   Methadone Scn, Ur NONE DETECTED NONE DETECTED    Comment: (NOTE) Tricyclics + metabolites, urine    Cutoff 1000 ng/mL Amphetamines + metabolites, urine  Cutoff 1000 ng/mL MDMA (Ecstasy), urine              Cutoff 500 ng/mL Cocaine  Metabolite, urine          Cutoff 300 ng/mL Opiate + metabolites, urine        Cutoff 300 ng/mL Phencyclidine (PCP), urine         Cutoff 25 ng/mL Cannabinoid, urine                 Cutoff 50 ng/mL Barbiturates + metabolites, urine  Cutoff 200 ng/mL Benzodiazepine, urine              Cutoff 200 ng/mL Methadone, urine                   Cutoff 300 ng/mL  The urine drug screen provides only a preliminary, unconfirmed analytical test result and should not be used for non-medical purposes. Clinical consideration and professional judgment should be applied to any positive drug screen result due to possible interfering substances. A more specific alternate  chemical method must be used in order to obtain a confirmed analytical result. Gas chromatography / mass spectrometry (GC/MS) is the preferred confirm atory method. Performed at [facility], [address] [place], [place] [address]     [employer] [employer]  Result Date: 02/22/2022 CLINICAL DATA:  Right lower quadrant pain. EXAM: LIMITED OBSTETRIC ULTRASOUND COMPARISON:  January 09, 2022 FINDINGS: Number of Fetuses: 1 Heart Rate:  160 bpm Movement: Yes Presentation: Cephalic Placental Location: Anterior Previa: No Amniotic Fluid (Subjective):  Within normal limits. AFI: N/A cm FL: 1.44 cm 14 w  2 d MATERNAL FINDINGS: Cervix:  Appears closed. Uterus/Adnexae: No abnormality visualized. Other: A 0.9 cm x 0.9 cm x 1.5 cm anechoic area is seen within the placenta. No abnormal flow is noted within this region on color Doppler evaluation. IMPRESSION: Single, viable intrauterine pregnancy at approximately 14 weeks and 2 days gestation by ultrasound evaluation. This exam is performed on an emergent basis and does not comprehensively evaluate fetal size, dating, or anatomy; follow-up complete OB US should be considered if further fetal assessment is warranted. Electronically Signed   By: Virgina Norfolk M.D.   On: 02/22/2022 03:29   DG Chest 2  View  Result Date: 02/22/2022 CLINICAL DATA:  Fevers and darkened urine, initial encounter EXAM: CHEST - 2 VIEW COMPARISON:  None Available. FINDINGS: The heart size and mediastinal contours are within normal limits. Both lungs are clear. The visualized skeletal structures are unremarkable. IMPRESSION: No active cardiopulmonary disease. Electronically Signed   By: Inez Catalina M.D.   On: 02/22/2022 00:51    Current scheduled medications  [START ON 02/23/2022] potassium chloride  40 mEq Oral Daily   sodium chloride flush  3 mL Intravenous Q12H    I have reviewed the patient's current medications.  ASSESSMENT: Patient Active Problem List   Diagnosis Date Noted   Pelvic inflammatory disease (PID) 02/22/2022   Substance abuse affecting pregnancy in third trimester, antepartum ([place]) 04/29/2021   Abdominal pain during pregnancy in third trimester 04/27/2021   Prenatal care insufficient 04/27/2021   Preterm labor third trimester with preterm delivery third trimester 04/27/2021   Desires VBAC (vaginal birth after cesarean) trial 02/08/2021   Obesity affecting pregnancy in first trimester 04/25/2020   MDD (major depressive disorder), recurrent episode, severe ([place]) 12/02/2019   Tobacco use complicating pregnancy, first trimester 08/21/2017   Family history of SIDS (sudden infant death syndrome) 05-20-2017   Supervision of high risk pregnancy, antepartum 04/06/2017   History of pre-eclampsia 04/06/2017   Marijuana abuse 12/07/2016   Abnormal liver function tests 09/18/2016   Chronic headaches 09/18/2016    PLAN:  On Rocephin and Flagyl for Trich and possible PID in pregnancy.  Discussed with Dr Glennon Mac regarding POC, he has consulted infectious disease. Pending blood and urine cultures.  Hypokalemia: given K-Dur 40eq daily one dose.     Francetta Found, CNM 02/22/2022  12:09 PM

## 2022-02-23 ENCOUNTER — Inpatient Hospital Stay: Payer: Medicaid Other

## 2022-02-23 DIAGNOSIS — N739 Female pelvic inflammatory disease, unspecified: Secondary | ICD-10-CM

## 2022-02-23 LAB — COMPREHENSIVE METABOLIC PANEL
ALT: 36 U/L (ref 0–44)
AST: 26 U/L (ref 15–41)
Albumin: 2.8 g/dL — ABNORMAL LOW (ref 3.5–5.0)
Alkaline Phosphatase: 121 U/L (ref 38–126)
Anion gap: 5 (ref 5–15)
BUN: 9 mg/dL (ref 6–20)
CO2: 21 mmol/L — ABNORMAL LOW (ref 22–32)
Calcium: 8.6 mg/dL — ABNORMAL LOW (ref 8.9–10.3)
Chloride: 109 mmol/L (ref 98–111)
Creatinine, Ser: 0.45 mg/dL (ref 0.44–1.00)
GFR, Estimated: >60 mL/min (ref >60)
Glucose, Bld: 87 mg/dL (ref 70–99)
Potassium: 3.9 mmol/L (ref 3.5–5.1)
Sodium: 135 mmol/L (ref 135–145)
Total Bilirubin: 0.5 mg/dL (ref 0.3–1.2)
Total Protein: 5.9 g/dL — ABNORMAL LOW (ref 6.5–8.1)

## 2022-02-23 LAB — CBC
HCT: 31.9 % — ABNORMAL LOW (ref 36.0–46.0)
Hemoglobin: 10.5 g/dL — ABNORMAL LOW (ref 12.0–15.0)
MCH: 28.4 pg (ref 26.0–34.0)
MCHC: 32.9 g/dL (ref 30.0–36.0)
MCV: 86.2 fL (ref 80.0–100.0)
Platelets: 233 10*3/uL (ref 150–400)
RBC: 3.7 MIL/uL — ABNORMAL LOW (ref 3.87–5.11)
RDW: 18.5 % — ABNORMAL HIGH (ref 11.5–15.5)
WBC: 11 10*3/uL — ABNORMAL HIGH (ref 4.0–10.5)
nRBC: 0 % (ref 0.0–0.2)

## 2022-02-23 LAB — URINE CULTURE: Culture: <10000 — AB

## 2022-02-23 LAB — RPR: RPR Ser Ql: NONREACTIVE

## 2022-02-23 MED ORDER — AMOXICILLIN-POT CLAVULANATE 875-125 MG PO TABS: 1.0000 | ORAL_TABLET | Freq: Two times a day (BID) | ORAL | 0 refills | Status: AC

## 2022-02-23 MED ORDER — METRONIDAZOLE 500 MG PO TABS: 500.0000 mg | ORAL_TABLET | Freq: Two times a day (BID) | ORAL | 0 refills | Status: AC

## 2022-02-23 MED ORDER — AZITHROMYCIN 250 MG PO TABS: 250.0000 mg | ORAL_TABLET | Freq: Every day | ORAL | 0 refills | Status: AC

## 2022-02-23 MED ORDER — NICOTINE 21 MG/24HR TD PT24
21.0000 mg | MEDICATED_PATCH | Freq: Every day | TRANSDERMAL | Status: DC
  Administered 2022-02-23: 21 mg via TRANSDERMAL
  Filled 2022-02-23: qty 1

## 2022-02-23 MED ORDER — DEXTROSE 5 % IV SOLN: 250.0000 mg | INTRAVENOUS | Status: DC

## 2022-02-23 MED ORDER — ACETAMINOPHEN 500 MG PO TABS: 1000.0000 mg | ORAL_TABLET | Freq: Four times a day (QID) | ORAL | 0 refills | Status: AC | PRN

## 2022-02-23 MED ORDER — ONDANSETRON HCL 4 MG PO TABS: 4.0000 mg | ORAL_TABLET | Freq: Four times a day (QID) | ORAL | 0 refills | Status: DC | PRN

## 2022-02-23 NOTE — Progress Notes (Signed)
Pt stated that she wanted to go outside and smoke. RN stated that she could not leave the hospital with IV sites and offered to remove the IVs so that she could go outside to smoke. Nicotine alternatives offered to pt as well. Pt requested AMA papers. Pt warned of the risks of leaving AMA. AMA papers signed. AVS instructions given. Pt escorted out by staff.

## 2022-02-23 NOTE — Progress Notes (Addendum)
ANTEPARTUM PROGRESS NOTE  Susan [name] is a 26 y.o. [id number] at [redacted]w[redacted]d with [employer] of Estimated Date of Delivery: 08/21/22 who is admitted for acute cervicitis and fever.   Length of Stay:  1 Days. Admitted 02/22/2022  Subjective: Patient reports craving nicotine, she usually smokes 1ppd cigarettes and says that the patch isnt working.  She reports no bleeding and no loss of fluid per vagina. She reports that she feels fine today and wants to go home.   Vitals:  BP 125/88 (BP Location: Right Arm)   Pulse 89   Temp 98.3 F (36.8 C) (Oral)   Resp 20   Ht 5\' 5"  (1.651 m)   Wt 77.1 kg   LMP  (LMP Unknown)   SpO2 100%   BMI 28.29 kg/m   Physical Examination: CONSTITUTIONAL: Well-developed, well-nourished female in no acute distress.  HENT:  Normocephalic, atraumatic EYES: Conjunctivae and EOM are normal.  SKIN: Skin is warm and dry.  [place]: Alert and oriented to person, place, and time.  PSYCHIATRIC: Normal mood and affect. Normal behavior.  CARDIOVASCULAR: Normal heart rate noted, regular rhythm RESPIRATORY: Effort and breath sounds normal MUSCULOSKELETAL: Normal range of motion. No edema and no tenderness. ABDOMEN: Soft, nontender, nondistended, gravid.  Uterus: soft, but notably tender to palpation.  Fetal monitoring: FHR: 160s bpm   Results for orders placed or performed during the hospital encounter of 02/22/22 (from the past 48 hour(s))  Comprehensive metabolic panel     Status: Abnormal   Collection Time: 02/22/22 12:17 AM  Result Value Ref Range   Sodium 134 (L) 135 - 145 mmol/L   Potassium 2.9 (L) 3.5 - 5.1 mmol/L   Chloride 104 98 - 111 mmol/L   CO2 21 (L) 22 - 32 mmol/L   Glucose, Bld 160 (H) 70 - 99 mg/dL    Comment: Glucose reference range applies only to samples taken after fasting for at least 8 hours.   BUN 10 6 - 20 mg/dL   Creatinine, Ser 0.62 0.44 - 1.00 mg/dL   Calcium 9.5 8.9 - 10.3 mg/dL   Total Protein 7.8 6.5 - 8.1 g/dL   Albumin 3.7 3.5 - 5.0  g/dL   AST 21 15 - 41 U/L   ALT 29 0 - 44 U/L   Alkaline Phosphatase 155 (H) 38 - 126 U/L   Total Bilirubin 0.7 0.3 - 1.2 mg/dL   GFR, Estimated >60 >60 mL/min    Comment: (NOTE) Calculated using the CKD-EPI Creatinine Equation (2021)    Anion gap 9 5 - 15    Comment: Performed at [facility], [address] [place], [place] [address]  Lactic acid, plasma     Status: None   Collection Time: 02/22/22 12:17 AM  Result Value Ref Range   Lactic Acid, Venous 1.7 0.5 - 1.9 mmol/L    Comment: Performed at [facility], [address] [place], [place] [address]  CBC with Differential     Status: Abnormal   Collection Time: 02/22/22 12:17 AM  Result Value Ref Range   WBC 15.4 (H) 4.0 - 10.5 K/uL   RBC 4.67 3.87 - 5.11 MIL/uL   Hemoglobin 13.1 12.0 - 15.0 g/dL   HCT 40.0 36.0 - 46.0 %   MCV 85.7 80.0 - 100.0 fL   MCH 28.1 26.0 - 34.0 pg   MCHC 32.8 30.0 - 36.0 g/dL   RDW 18.3 (H) 11.5 - 15.5 %   Platelets 318 150 - 400 K/uL   nRBC  0.0 0.0 - 0.2 %   Neutrophils Relative % 76 %   Neutro Abs 11.5 (H) 1.7 - 7.7 K/uL   Lymphocytes Relative 19 %   Lymphs Abs 2.9 0.7 - 4.0 K/uL   Monocytes Relative 5 %   Monocytes Absolute 0.8 0.1 - 1.0 K/uL   Eosinophils Relative 0 %   Eosinophils Absolute 0.1 0.0 - 0.5 K/uL   Basophils Relative 0 %   Basophils Absolute 0.1 0.0 - 0.1 K/uL   Immature Granulocytes 0 %   Abs Immature Granulocytes 0.06 0.00 - 0.07 K/uL    Comment: Performed at [facility], [address] [place], [place] [address]  Protime-INR     Status: None   Collection Time: 02/22/22 12:17 AM  Result Value Ref Range   Prothrombin Time 13.6 11.4 - 15.2 seconds   INR 1.1 0.8 - 1.2    Comment: (NOTE) INR goal varies based on device and disease states. Performed at [facility], [address] [place], [place] [address]   Culture, blood (Routine x 2)     Status: None (Preliminary result)   Collection Time: 02/22/22 12:17 AM   Specimen:  BLOOD  Result Value Ref Range   Specimen Description BLOOD BLOOD RIGHT ARM    Special Requests      BOTTLES DRAWN AEROBIC AND ANAEROBIC Blood Culture adequate volume   Culture      NO GROWTH 1 DAY Performed at [facility], [address], [place], [place] [address]    Report Status PENDING   Urinalysis, Routine w reflex microscopic Urine, Clean Catch     Status: Abnormal   Collection Time: 02/22/22 12:17 AM  Result Value Ref Range   Color, Urine AMBER (A) YELLOW    Comment: BIOCHEMICALS MAY BE AFFECTED BY COLOR   APPearance CLOUDY (A) CLEAR   Specific Gravity, Urine 1.027 1.005 - 1.030   pH 5.0 5.0 - 8.0   Glucose, UA NEGATIVE NEGATIVE mg/dL   Hgb urine dipstick SMALL (A) NEGATIVE   Bilirubin Urine NEGATIVE NEGATIVE   Ketones, ur 5 (A) NEGATIVE mg/dL   Protein, ur NEGATIVE NEGATIVE mg/dL   Nitrite NEGATIVE NEGATIVE   Leukocytes,Ua LARGE (A) NEGATIVE   RBC / HPF 6-10 0 - 5 RBC/hpf   WBC, UA 21-50 0 - 5 WBC/hpf   Bacteria, UA RARE (A) NONE SEEN   Squamous Epithelial / LPF 6-10 0 - 5   Mucus PRESENT    Sperm, UA PRESENT     Comment: Performed at [facility], [address] [place], [place] [address]  hCG, quantitative, pregnancy     Status: Abnormal   Collection Time: 02/22/22 12:17 AM  Result Value Ref Range   hCG, Beta Chain, Quant, S 110,260 (H) <5 mIU/mL    Comment:          GEST. AGE      CONC.  (mIU/mL)   <=1 WEEK        5 - 50     2 WEEKS       50 - 500     3 WEEKS       100 - 10,000     4 WEEKS     1,000 - 30,000     5 WEEKS     3,500 - 115,000   6-8 WEEKS     12,000 - 270,000    12 WEEKS     15,000 - 220,000        FEMALE  AND NON-PREGNANT FEMALE:     LESS THAN 5 mIU/mL Performed at [facility], [address] [place], [place] [address]   Hemoglobin A1c     Status: None   Collection Time: 02/22/22 12:17 AM  Result Value Ref Range   Hgb A1c MFr Bld 5.3 4.8 - 5.6 %    Comment: (NOTE) Pre diabetes:           5.7%-6.4%  Diabetes:              >6.4%  Glycemic control for   <7.0% adults with diabetes    Mean Plasma Glucose 105.41 mg/dL    Comment: Performed at [facility], [address], [place], [place] [address]  Culture, blood (Routine x 2)     Status: None (Preliminary result)   Collection Time: 02/22/22 12:30 AM   Specimen: BLOOD  Result Value Ref Range   Specimen Description BLOOD LEFT ANTECUBITAL    Special Requests      BOTTLES DRAWN AEROBIC ONLY Blood Culture adequate volume   Culture      NO GROWTH 1 DAY Performed at [facility], [address] [place], [place] [address]    Report Status PENDING   Chlamydia/NGC rt PCR ([facility] only)     Status: None   Collection Time: 02/22/22  1:04 AM   Specimen: Cervical/Vaginal swab  Result Value Ref Range   Specimen source GC/Chlam ENDOCERVICAL    Chlamydia Tr NOT DETECTED NOT DETECTED   N gonorrhoeae NOT DETECTED NOT DETECTED    Comment: (NOTE) This CT/NG assay has not been evaluated in patients with a history of  hysterectomy. Performed at [facility], [address] [place], [place] [address]   Wet prep, genital     Status: Abnormal   Collection Time: 02/22/22  1:04 AM   Specimen: Cervical/Vaginal swab  Result Value Ref Range   Yeast Wet Prep HPF POC NONE SEEN NONE SEEN   Trich, Wet Prep PRESENT (A) NONE SEEN   Clue Cells Wet Prep HPF POC NONE SEEN NONE SEEN   WBC, Wet Prep HPF POC >10 (A) <10   Sperm NONE SEEN     Comment: Performed at [facility], [address] [place], [place] [address]  Urine Culture     Status: Abnormal   Collection Time: 02/22/22  6:00 AM   Specimen: Urine, Clean Catch  Result Value Ref Range   Specimen Description      URINE, CLEAN CATCH Performed at [facility], [address], [place], [place] [address]    Special Requests      NONE Performed at [facility], [address] [place], [place] [address]    Culture (A)     <10,000  COLONIES/mL INSIGNIFICANT GROWTH NO GROUP B STREP (S.AGALACTIAE) ISOLATED Performed at [facility], [address], [place], [place] [address]    Report Status 02/23/2022 FINAL   Urine Drug Screen, Qualitative ([facility] only)     Status: Abnormal   Collection Time: 02/22/22  6:00 AM  Result Value Ref Range   Tricyclic, Ur Screen NONE DETECTED NONE DETECTED   Amphetamines, Ur Screen POSITIVE (A) NONE DETECTED   MDMA (Ecstasy)Ur Screen NONE DETECTED NONE DETECTED   Cocaine Metabolite,Ur [place] NONE DETECTED NONE DETECTED   Opiate, Ur Screen NONE DETECTED NONE DETECTED   Phencyclidine (PCP) Ur S NONE DETECTED NONE DETECTED   Cannabinoid 50 Ng, Ur [place] POSITIVE (A) NONE DETECTED   Barbiturates,  Ur Screen NONE DETECTED NONE DETECTED   Benzodiazepine, Ur Scrn NONE DETECTED NONE DETECTED   Methadone Scn, Ur NONE DETECTED NONE DETECTED    Comment: (NOTE) Tricyclics + metabolites, urine    Cutoff 1000 ng/mL Amphetamines + metabolites, urine  Cutoff 1000 ng/mL MDMA (Ecstasy), urine              Cutoff 500 ng/mL Cocaine Metabolite, urine          Cutoff 300 ng/mL Opiate + metabolites, urine        Cutoff 300 ng/mL Phencyclidine (PCP), urine         Cutoff 25 ng/mL Cannabinoid, urine                 Cutoff 50 ng/mL Barbiturates + metabolites, urine  Cutoff 200 ng/mL Benzodiazepine, urine              Cutoff 200 ng/mL Methadone, urine                   Cutoff 300 ng/mL  The urine drug screen provides only a preliminary, unconfirmed analytical test result and should not be used for non-medical purposes. Clinical consideration and professional judgment should be applied to any positive drug screen result due to possible interfering substances. A more specific alternate chemical method must be used in order to obtain a confirmed analytical result. Gas chromatography / mass spectrometry (GC/MS) is the preferred confirm atory method. Performed at [facility], [address]  [place], [place] [address]   Lactic acid, plasma     Status: None   Collection Time: 02/22/22  1:59 PM  Result Value Ref Range   Lactic Acid, Venous 0.6 0.5 - 1.9 mmol/L    Comment: Performed at [facility], [address] [place], [place] [address]  HIV Antibody (routine testing w rflx)     Status: None   Collection Time: 02/22/22  1:59 PM  Result Value Ref Range   HIV Screen 4th Generation wRfx Non Reactive Non Reactive    Comment: Performed at [facility], [address] [place], [place] [address]  RPR     Status: None   Collection Time: 02/22/22  1:59 PM  Result Value Ref Range   RPR Ser Ql NON REACTIVE NON REACTIVE    Comment: Performed at [facility], [address] [place], [place] [address]  Comprehensive metabolic panel     Status: Abnormal   Collection Time: 02/23/22  6:45 AM  Result Value Ref Range   Sodium 135 135 - 145 mmol/L   Potassium 3.9 3.5 - 5.1 mmol/L   Chloride 109 98 - 111 mmol/L   CO2 21 (L) 22 - 32 mmol/L   Glucose, Bld 87 70 - 99 mg/dL    Comment: Glucose reference range applies only to samples taken after fasting for at least 8 hours.   BUN 9 6 - 20 mg/dL   Creatinine, Ser 1.85 0.44 - 1.00 mg/dL   Calcium 8.6 (L) 8.9 - 10.3 mg/dL   Total Protein 5.9 (L) 6.5 - 8.1 g/dL   Albumin 2.8 (L) 3.5 - 5.0 g/dL   AST 26 15 - 41 U/L   ALT 36 0 - 44 U/L   Alkaline Phosphatase 121 38 - 126 U/L   Total Bilirubin 0.5 0.3 - 1.2 mg/dL   GFR, Estimated >63 >14 mL/min    Comment: (NOTE) Calculated using the CKD-EPI Creatinine Equation (2021)    Anion gap 5  5 - 15    Comment: Performed at [facility], [address] [place], [place] [address]  CBC     Status: Abnormal   Collection Time: 02/23/22  6:45 AM  Result Value Ref Range   WBC 11.0 (H) 4.0 - 10.5 K/uL   RBC 3.70 (L) 3.87 - 5.11 MIL/uL   Hemoglobin 10.5 (L) 12.0 - 15.0 g/dL   HCT 31.9 (L) 36.0 - 46.0 %   MCV 86.2 80.0 - 100.0 fL   MCH 28.4 26.0 - 34.0 pg   MCHC 32.9 30.0  - 36.0 g/dL   RDW 18.5 (H) 11.5 - 15.5 %   Platelets 233 150 - 400 K/uL   nRBC 0.0 0.0 - 0.2 %    Comment: Performed at [facility], [address] [place], [place] [address]    [place] Transvaginal  Result Date: 02/23/2022 CLINICAL DATA:  Pelvic inflammatory disease EXAM: TRANSVAGINAL OBSTETRICAL ULTRASOUND >14 WKS FINDINGS: Number of Fetuses: 1 Heart Rate:  160 bpm Movement: Yes Previa: No Placental Location: Anterior Amniotic Fluid (Subjective): Normal FETAL BIOMETRY BPD: 2.7cm 14w 5d Maternal Findings: Cervix:  Closed. Adnexa: Unremarkable.  No masses. Other: No free fluid. IMPRESSION: 1. Single live intrauterine gestation measuring 14 weeks 5 days by biparietal diameter. 2. Active fetal heart tones at 160 BPM. 3. Adnexal regions within normal limits.  No free fluid. Electronically Signed   By: Davina Poke D.O.   On: 02/23/2022 12:28   US OB Limited  Result Date: 02/22/2022 CLINICAL DATA:  Right lower quadrant pain. EXAM: LIMITED OBSTETRIC ULTRASOUND COMPARISON:  January 09, 2022 FINDINGS: Number of Fetuses: 1 Heart Rate:  160 bpm Movement: Yes Presentation: Cephalic Placental Location: Anterior Previa: No Amniotic Fluid (Subjective):  Within normal limits. AFI: N/A cm FL: 1.44 cm 14 w  2 d MATERNAL FINDINGS: Cervix:  Appears closed. Uterus/Adnexae: No abnormality visualized. Other: A 0.9 cm x 0.9 cm x 1.5 cm anechoic area is seen within the placenta. No abnormal flow is noted within this region on color Doppler evaluation. IMPRESSION: Single, viable intrauterine pregnancy at approximately 14 weeks and 2 days gestation by ultrasound evaluation. This exam is performed on an emergent basis and does not comprehensively evaluate fetal size, dating, or anatomy; follow-up complete OB US should be considered if further fetal assessment is warranted. Electronically Signed   By: Virgina Norfolk M.D.   On: 02/22/2022 03:29   DG Chest 2 View  Result Date: 02/22/2022 CLINICAL DATA:   Fevers and darkened urine, initial encounter EXAM: CHEST - 2 VIEW COMPARISON:  None Available. FINDINGS: The heart size and mediastinal contours are within normal limits. Both lungs are clear. The visualized skeletal structures are unremarkable. IMPRESSION: No active cardiopulmonary disease. Electronically Signed   By: Inez Catalina M.D.   On: 02/22/2022 00:51    Current scheduled medications  nicotine  21 mg Transdermal Daily   potassium chloride  40 mEq Oral Daily   sodium chloride flush  3 mL Intravenous Q12H    I have reviewed the patient's current medications.  ASSESSMENT: Patient Active Problem List   Diagnosis Date Noted   Pelvic inflammatory disease (PID) 02/22/2022   Substance abuse affecting pregnancy in third trimester, antepartum ([place]) 04/29/2021   Abdominal pain during pregnancy in third trimester 04/27/2021   Prenatal care insufficient 04/27/2021   Preterm labor third trimester with preterm delivery third trimester 04/27/2021   Desires VBAC (vaginal birth after cesarean) trial 02/08/2021   Obesity affecting pregnancy in first trimester  04/25/2020   MDD (major depressive disorder), recurrent episode, severe (HCC) 12/02/2019   Tobacco use complicating pregnancy, first trimester 08/21/2017   Family history of SIDS (sudden infant death syndrome) 05-11-2017   Supervision of high risk pregnancy, antepartum 04/06/2017   History of pre-eclampsia 04/06/2017   Marijuana abuse 12/07/2016   Abnormal liver function tests 09/18/2016   Chronic headaches 09/18/2016    PLAN:  On Rocephin and Flagyl for Trich and possible PID in pregnancy, s/p 2 days of IV Abx.  Discussed with Dr Jean Rosenthal regarding POC, he has consulted infectious disease. Pending blood culture. Negative urine culture.  - current plan is to have 3days of IV Abx and dc home with total 14d of abx therapy, pending decision on outpatient regimen.   Hypokalemia: given K-Dur 40eq daily x 2 doses with normal potassium on  repeat labs done this am.   Continue nicotine patch for withdrawal   Discussion with pt and her mother regarding risks of leaving prior to completion of recommended IV Abx, reviewed recs from ID consult and from Dr Jean Rosenthal, pt and her mother want to know why blood cultures are not repeated. Again reiterated safety for pt and her baby and recommendation to remain inpatient for full course. Pt has decided to leave AMA, appropriate paperwork signed, Rx for recommended flagyl, augmentin and azithromycin sent to pharmacy on file.    Randa Ngo, CNM 02/23/2022  5:46 PM

## 2022-02-23 NOTE — Discharge Summary (Signed)
ANTEPARTUM PROGRESS NOTE  Susan [name] is a 26 y.o. [id number] at [redacted]w[redacted]d with [employer] of Estimated Date of Delivery: 08/21/22 who is admitted for acute cervicitis and fever.   Length of Stay:  1 Days. Admitted 02/22/2022  Subjective: Patient reports craving nicotine, she usually smokes 1ppd cigarettes and says that the patch isnt working.  She reports no bleeding and no loss of fluid per vagina. She reports that she feels fine today and wants to go home.   Vitals:  BP 125/88 (BP Location: Right Arm)   Pulse 89   Temp 98.3 F (36.8 C) (Oral)   Resp 20   Ht 5\' 5"  (1.651 m)   Wt 77.1 kg   LMP  (LMP Unknown)   SpO2 100%   BMI 28.29 kg/m   Physical Examination: CONSTITUTIONAL: Well-developed, well-nourished female in no acute distress.  HENT:  Normocephalic, atraumatic EYES: Conjunctivae and EOM are normal.  SKIN: Skin is warm and dry.  [place]: Alert and oriented to person, place, and time.  PSYCHIATRIC: Normal mood and affect. Normal behavior.  CARDIOVASCULAR: Normal heart rate noted, regular rhythm RESPIRATORY: Effort and breath sounds normal MUSCULOSKELETAL: Normal range of motion. No edema and no tenderness. ABDOMEN: Soft, nontender, nondistended, gravid.  Uterus: soft, but notably tender to palpation.  Fetal monitoring: FHR: 160s bpm   Results for orders placed or performed during the hospital encounter of 02/22/22 (from the past 48 hour(s))  Comprehensive metabolic panel     Status: Abnormal   Collection Time: 02/22/22 12:17 AM  Result Value Ref Range   Sodium 134 (L) 135 - 145 mmol/L   Potassium 2.9 (L) 3.5 - 5.1 mmol/L   Chloride 104 98 - 111 mmol/L   CO2 21 (L) 22 - 32 mmol/L   Glucose, Bld 160 (H) 70 - 99 mg/dL    Comment: Glucose reference range applies only to samples taken after fasting for at least 8 hours.   BUN 10 6 - 20 mg/dL   Creatinine, Ser 0.62 0.44 - 1.00 mg/dL   Calcium 9.5 8.9 - 10.3 mg/dL   Total Protein 7.8 6.5 - 8.1 g/dL   Albumin 3.7 3.5 - 5.0  g/dL   AST 21 15 - 41 U/L   ALT 29 0 - 44 U/L   Alkaline Phosphatase 155 (H) 38 - 126 U/L   Total Bilirubin 0.7 0.3 - 1.2 mg/dL   GFR, Estimated >60 >60 mL/min    Comment: (NOTE) Calculated using the CKD-EPI Creatinine Equation (2021)    Anion gap 9 5 - 15    Comment: Performed at [facility], [address] [place], [place] [address]  Lactic acid, plasma     Status: None   Collection Time: 02/22/22 12:17 AM  Result Value Ref Range   Lactic Acid, Venous 1.7 0.5 - 1.9 mmol/L    Comment: Performed at [facility], [address] [place], [place] [address]  CBC with Differential     Status: Abnormal   Collection Time: 02/22/22 12:17 AM  Result Value Ref Range   WBC 15.4 (H) 4.0 - 10.5 K/uL   RBC 4.67 3.87 - 5.11 MIL/uL   Hemoglobin 13.1 12.0 - 15.0 g/dL   HCT 40.0 36.0 - 46.0 %   MCV 85.7 80.0 - 100.0 fL   MCH 28.1 26.0 - 34.0 pg   MCHC 32.8 30.0 - 36.0 g/dL   RDW 18.3 (H) 11.5 - 15.5 %   Platelets 318 150 - 400 K/uL   nRBC  0.0 0.0 - 0.2 %   Neutrophils Relative % 76 %   Neutro Abs 11.5 (H) 1.7 - 7.7 K/uL   Lymphocytes Relative 19 %   Lymphs Abs 2.9 0.7 - 4.0 K/uL   Monocytes Relative 5 %   Monocytes Absolute 0.8 0.1 - 1.0 K/uL   Eosinophils Relative 0 %   Eosinophils Absolute 0.1 0.0 - 0.5 K/uL   Basophils Relative 0 %   Basophils Absolute 0.1 0.0 - 0.1 K/uL   Immature Granulocytes 0 %   Abs Immature Granulocytes 0.06 0.00 - 0.07 K/uL    Comment: Performed at [facility], [address] [place], [place] [address]  Protime-INR     Status: None   Collection Time: 02/22/22 12:17 AM  Result Value Ref Range   Prothrombin Time 13.6 11.4 - 15.2 seconds   INR 1.1 0.8 - 1.2    Comment: (NOTE) INR goal varies based on device and disease states. Performed at [facility], [address] [place], [place] [address]   Culture, blood (Routine x 2)     Status: None (Preliminary result)   Collection Time: 02/22/22 12:17 AM   Specimen:  BLOOD  Result Value Ref Range   Specimen Description BLOOD BLOOD RIGHT ARM    Special Requests      BOTTLES DRAWN AEROBIC AND ANAEROBIC Blood Culture adequate volume   Culture      NO GROWTH 1 DAY Performed at [facility], [address], [place], [place] [address]    Report Status PENDING   Urinalysis, Routine w reflex microscopic Urine, Clean Catch     Status: Abnormal   Collection Time: 02/22/22 12:17 AM  Result Value Ref Range   Color, Urine AMBER (A) YELLOW    Comment: BIOCHEMICALS MAY BE AFFECTED BY COLOR   APPearance CLOUDY (A) CLEAR   Specific Gravity, Urine 1.027 1.005 - 1.030   pH 5.0 5.0 - 8.0   Glucose, UA NEGATIVE NEGATIVE mg/dL   Hgb urine dipstick SMALL (A) NEGATIVE   Bilirubin Urine NEGATIVE NEGATIVE   Ketones, ur 5 (A) NEGATIVE mg/dL   Protein, ur NEGATIVE NEGATIVE mg/dL   Nitrite NEGATIVE NEGATIVE   Leukocytes,Ua LARGE (A) NEGATIVE   RBC / HPF 6-10 0 - 5 RBC/hpf   WBC, UA 21-50 0 - 5 WBC/hpf   Bacteria, UA RARE (A) NONE SEEN   Squamous Epithelial / LPF 6-10 0 - 5   Mucus PRESENT    Sperm, UA PRESENT     Comment: Performed at [facility], [address] [place], [place] [address]  hCG, quantitative, pregnancy     Status: Abnormal   Collection Time: 02/22/22 12:17 AM  Result Value Ref Range   hCG, Beta Chain, Quant, S 110,260 (H) <5 mIU/mL    Comment:          GEST. AGE      CONC.  (mIU/mL)   <=1 WEEK        5 - 50     2 WEEKS       50 - 500     3 WEEKS       100 - 10,000     4 WEEKS     1,000 - 30,000     5 WEEKS     3,500 - 115,000   6-8 WEEKS     12,000 - 270,000    12 WEEKS     15,000 - 220,000        FEMALE  AND NON-PREGNANT FEMALE:     LESS THAN 5 mIU/mL Performed at [facility], [address] [place], [place] [address]   Hemoglobin A1c     Status: None   Collection Time: 02/22/22 12:17 AM  Result Value Ref Range   Hgb A1c MFr Bld 5.3 4.8 - 5.6 %    Comment: (NOTE) Pre diabetes:           5.7%-6.4%  Diabetes:              >6.4%  Glycemic control for   <7.0% adults with diabetes    Mean Plasma Glucose 105.41 mg/dL    Comment: Performed at [facility], [address], [place], [place] [address]  Culture, blood (Routine x 2)     Status: None (Preliminary result)   Collection Time: 02/22/22 12:30 AM   Specimen: BLOOD  Result Value Ref Range   Specimen Description BLOOD LEFT ANTECUBITAL    Special Requests      BOTTLES DRAWN AEROBIC ONLY Blood Culture adequate volume   Culture      NO GROWTH 1 DAY Performed at [facility], [address] [place], [place] [address]    Report Status PENDING   Chlamydia/NGC rt PCR ([facility] only)     Status: None   Collection Time: 02/22/22  1:04 AM   Specimen: Cervical/Vaginal swab  Result Value Ref Range   Specimen source GC/Chlam ENDOCERVICAL    Chlamydia Tr NOT DETECTED NOT DETECTED   N gonorrhoeae NOT DETECTED NOT DETECTED    Comment: (NOTE) This CT/NG assay has not been evaluated in patients with a history of  hysterectomy. Performed at [facility], [address] [place], [place] [address]   Wet prep, genital     Status: Abnormal   Collection Time: 02/22/22  1:04 AM   Specimen: Cervical/Vaginal swab  Result Value Ref Range   Yeast Wet Prep HPF POC NONE SEEN NONE SEEN   Trich, Wet Prep PRESENT (A) NONE SEEN   Clue Cells Wet Prep HPF POC NONE SEEN NONE SEEN   WBC, Wet Prep HPF POC >10 (A) <10   Sperm NONE SEEN     Comment: Performed at [facility], [address] [place], [place] [address]  Urine Culture     Status: Abnormal   Collection Time: 02/22/22  6:00 AM   Specimen: Urine, Clean Catch  Result Value Ref Range   Specimen Description      URINE, CLEAN CATCH Performed at [facility], [address], [place], [place] [address]    Special Requests      NONE Performed at [facility], [address] [place], [place] [address]    Culture (A)     <10,000  COLONIES/mL INSIGNIFICANT GROWTH NO GROUP B STREP (S.AGALACTIAE) ISOLATED Performed at [facility], [address], [place], [place] [address]    Report Status 02/23/2022 FINAL   Urine Drug Screen, Qualitative ([facility] only)     Status: Abnormal   Collection Time: 02/22/22  6:00 AM  Result Value Ref Range   Tricyclic, Ur Screen NONE DETECTED NONE DETECTED   Amphetamines, Ur Screen POSITIVE (A) NONE DETECTED   MDMA (Ecstasy)Ur Screen NONE DETECTED NONE DETECTED   Cocaine Metabolite,Ur [place] NONE DETECTED NONE DETECTED   Opiate, Ur Screen NONE DETECTED NONE DETECTED   Phencyclidine (PCP) Ur S NONE DETECTED NONE DETECTED   Cannabinoid 50 Ng, Ur [place] POSITIVE (A) NONE DETECTED   Barbiturates,  Ur Screen NONE DETECTED NONE DETECTED   Benzodiazepine, Ur Scrn NONE DETECTED NONE DETECTED   Methadone Scn, Ur NONE DETECTED NONE DETECTED    Comment: (NOTE) Tricyclics + metabolites, urine    Cutoff 1000 ng/mL Amphetamines + metabolites, urine  Cutoff 1000 ng/mL MDMA (Ecstasy), urine              Cutoff 500 ng/mL Cocaine Metabolite, urine          Cutoff 300 ng/mL Opiate + metabolites, urine        Cutoff 300 ng/mL Phencyclidine (PCP), urine         Cutoff 25 ng/mL Cannabinoid, urine                 Cutoff 50 ng/mL Barbiturates + metabolites, urine  Cutoff 200 ng/mL Benzodiazepine, urine              Cutoff 200 ng/mL Methadone, urine                   Cutoff 300 ng/mL  The urine drug screen provides only a preliminary, unconfirmed analytical test result and should not be used for non-medical purposes. Clinical consideration and professional judgment should be applied to any positive drug screen result due to possible interfering substances. A more specific alternate chemical method must be used in order to obtain a confirmed analytical result. Gas chromatography / mass spectrometry (GC/MS) is the preferred confirm atory method. Performed at [facility], [address]  [place], [place] [address]   Lactic acid, plasma     Status: None   Collection Time: 02/22/22  1:59 PM  Result Value Ref Range   Lactic Acid, Venous 0.6 0.5 - 1.9 mmol/L    Comment: Performed at [facility], [address] [place], [place] [address]  HIV Antibody (routine testing w rflx)     Status: None   Collection Time: 02/22/22  1:59 PM  Result Value Ref Range   HIV Screen 4th Generation wRfx Non Reactive Non Reactive    Comment: Performed at [facility], [address], [place], [place] [address]  RPR     Status: None   Collection Time: 02/22/22  1:59 PM  Result Value Ref Range   RPR Ser Ql NON REACTIVE NON REACTIVE    Comment: Performed at [facility], [address], [place], [place] [address]  Comprehensive metabolic panel     Status: Abnormal   Collection Time: 02/23/22  6:45 AM  Result Value Ref Range   Sodium 135 135 - 145 mmol/L   Potassium 3.9 3.5 - 5.1 mmol/L   Chloride 109 98 - 111 mmol/L   CO2 21 (L) 22 - 32 mmol/L   Glucose, Bld 87 70 - 99 mg/dL    Comment: Glucose reference range applies only to samples taken after fasting for at least 8 hours.   BUN 9 6 - 20 mg/dL   Creatinine, Ser 1.19 0.44 - 1.00 mg/dL   Calcium 8.6 (L) 8.9 - 10.3 mg/dL   Total Protein 5.9 (L) 6.5 - 8.1 g/dL   Albumin 2.8 (L) 3.5 - 5.0 g/dL   AST 26 15 - 41 U/L   ALT 36 0 - 44 U/L   Alkaline Phosphatase 121 38 - 126 U/L   Total Bilirubin 0.5 0.3 - 1.2 mg/dL   GFR, Estimated >41 >74 mL/min    Comment: (NOTE) Calculated using the CKD-EPI Creatinine Equation (2021)    Anion gap 5  5 - 15    Comment: Performed at [facility], [address] [place], [place] [address]  CBC     Status: Abnormal   Collection Time: 02/23/22  6:45 AM  Result Value Ref Range   WBC 11.0 (H) 4.0 - 10.5 K/uL   RBC 3.70 (L) 3.87 - 5.11 MIL/uL   Hemoglobin 10.5 (L) 12.0 - 15.0 g/dL   HCT 31.9 (L) 36.0 - 46.0 %   MCV 86.2 80.0 - 100.0 fL   MCH 28.4 26.0 - 34.0 pg   MCHC 32.9 30.0  - 36.0 g/dL   RDW 18.5 (H) 11.5 - 15.5 %   Platelets 233 150 - 400 K/uL   nRBC 0.0 0.0 - 0.2 %    Comment: Performed at [facility], [address] [place], [place] [address]    [place] Transvaginal  Result Date: 02/23/2022 CLINICAL DATA:  Pelvic inflammatory disease EXAM: TRANSVAGINAL OBSTETRICAL ULTRASOUND >14 WKS FINDINGS: Number of Fetuses: 1 Heart Rate:  160 bpm Movement: Yes Previa: No Placental Location: Anterior Amniotic Fluid (Subjective): Normal FETAL BIOMETRY BPD: 2.7cm 14w 5d Maternal Findings: Cervix:  Closed. Adnexa: Unremarkable.  No masses. Other: No free fluid. IMPRESSION: 1. Single live intrauterine gestation measuring 14 weeks 5 days by biparietal diameter. 2. Active fetal heart tones at 160 BPM. 3. Adnexal regions within normal limits.  No free fluid. Electronically Signed   By: Davina Poke D.O.   On: 02/23/2022 12:28   US OB Limited  Result Date: 02/22/2022 CLINICAL DATA:  Right lower quadrant pain. EXAM: LIMITED OBSTETRIC ULTRASOUND COMPARISON:  January 09, 2022 FINDINGS: Number of Fetuses: 1 Heart Rate:  160 bpm Movement: Yes Presentation: Cephalic Placental Location: Anterior Previa: No Amniotic Fluid (Subjective):  Within normal limits. AFI: N/A cm FL: 1.44 cm 14 w  2 d MATERNAL FINDINGS: Cervix:  Appears closed. Uterus/Adnexae: No abnormality visualized. Other: A 0.9 cm x 0.9 cm x 1.5 cm anechoic area is seen within the placenta. No abnormal flow is noted within this region on color Doppler evaluation. IMPRESSION: Single, viable intrauterine pregnancy at approximately 14 weeks and 2 days gestation by ultrasound evaluation. This exam is performed on an emergent basis and does not comprehensively evaluate fetal size, dating, or anatomy; follow-up complete OB US should be considered if further fetal assessment is warranted. Electronically Signed   By: Virgina Norfolk M.D.   On: 02/22/2022 03:29   DG Chest 2 View  Result Date: 02/22/2022 CLINICAL DATA:   Fevers and darkened urine, initial encounter EXAM: CHEST - 2 VIEW COMPARISON:  None Available. FINDINGS: The heart size and mediastinal contours are within normal limits. Both lungs are clear. The visualized skeletal structures are unremarkable. IMPRESSION: No active cardiopulmonary disease. Electronically Signed   By: Inez Catalina M.D.   On: 02/22/2022 00:51    Current scheduled medications  nicotine  21 mg Transdermal Daily   potassium chloride  40 mEq Oral Daily   sodium chloride flush  3 mL Intravenous Q12H    I have reviewed the patient's current medications.  ASSESSMENT: Patient Active Problem List   Diagnosis Date Noted   Pelvic inflammatory disease (PID) 02/22/2022   Substance abuse affecting pregnancy in third trimester, antepartum ([place]) 04/29/2021   Abdominal pain during pregnancy in third trimester 04/27/2021   Prenatal care insufficient 04/27/2021   Preterm labor third trimester with preterm delivery third trimester 04/27/2021   Desires VBAC (vaginal birth after cesarean) trial 02/08/2021   Obesity affecting pregnancy in first trimester  04/25/2020   MDD (major depressive disorder), recurrent episode, severe (HCC) 12/02/2019   Tobacco use complicating pregnancy, first trimester 08/21/2017   Family history of SIDS (sudden infant death syndrome) 05/01/2017   Supervision of high risk pregnancy, antepartum 04/06/2017   History of pre-eclampsia 04/06/2017   Marijuana abuse 12/07/2016   Abnormal liver function tests 09/18/2016   Chronic headaches 09/18/2016    PLAN:  On Rocephin and Flagyl for Trich and possible PID in pregnancy, s/p 2 days of IV Abx.  Discussed with Dr Jean Rosenthaljackson regarding POC, he has consulted infectious disease. Pending blood culture. Negative urine culture.  - current plan is to have 3days of IV Abx and dc home with total 14d of abx therapy, pending decision on outpatient regimen.   Hypokalemia: given K-Dur 40eq daily x 2 doses with normal potassium on  repeat labs done this am.   Continue nicotine patch for withdrawal   Discussion with pt and her mother regarding risks of leaving prior to completion of recommended IV Abx, reviewed recs from ID consult and from Dr Jean RosenthalJackson, pt and her mother want to know why blood cultures are not repeated. Again reiterated safety for pt and her baby and recommendation to remain inpatient for full course. Pt has decided to leave AMA, appropriate paperwork signed, Rx for recommended flagyl, augmentin and azithromycin sent to pharmacy on file.    Randa NgoRebecca A Canaan Holzer, CNM 02/23/2022  5:47 PM

## 2022-02-23 NOTE — Progress Notes (Signed)
ID Spoke to her nurse and team  Pt apparently is doing okay No fever in 24 hrs EDP' Dr.Dylan Tamala Julian  note states  "Chaperoned speculum pelvic exam with a closed cervical os yielding continuous flow of purulent material.  No bleeding.  Cervical motion tenderness is present." Dr.Smith also collected the endocervical specimen for GC/CHL  Patient Vitals for the past 24 hrs:  BP Temp Temp src Pulse Resp SpO2  02/23/22 0755 112/75 98.1 F (36.7 C) Oral 82 18 100 %  02/23/22 0415 112/70 98.4 F (36.9 C) Oral 95 20 100 %  02/23/22 0100 95/78 98.5 F (36.9 C) -- 98 18 100 %  02/22/22 1934 (!) 140/84 (!) 97.5 F (36.4 C) Oral (!) 115 20 100 %  02/22/22 1418 -- -- -- -- 20 --  02/22/22 1400 -- -- -- -- (!) 24 --  02/22/22 1300 -- -- -- (!) 122 (!) 24 99 %  02/22/22 1200 -- -- -- (!) 117 11 99 %  02/22/22 1159 136/83 98.4 F (36.9 C) Oral (!) 119 18 98 %  02/22/22 1100 -- -- -- (!) 127 17 100 %    Labs    Latest Ref Rng & Units 02/23/2022    6:45 AM 02/22/2022   12:17 AM 01/08/2022   11:55 PM  CBC  WBC 4.0 - 10.5 K/uL 11.0  15.4  12.3   Hemoglobin 12.0 - 15.0 g/dL 10.5  13.1  11.7   Hematocrit 36.0 - 46.0 % 31.9  40.0  37.2   Platelets 150 - 400 K/uL 233  318  329        Latest Ref Rng & Units 02/23/2022    6:45 AM 02/22/2022   12:17 AM 01/08/2022   11:55 PM  CMP  Glucose 70 - 99 mg/dL 87  160  104   BUN 6 - 20 mg/dL 9  10  10    Creatinine 0.44 - 1.00 mg/dL 0.45  0.62  0.61   Sodium 135 - 145 mmol/L 135  134  138   Potassium 3.5 - 5.1 mmol/L 3.9  2.9  2.9   Chloride 98 - 111 mmol/L 109  104  105   CO2 22 - 32 mmol/L 21  21  24    Calcium 8.9 - 10.3 mg/dL 8.6  9.5  9.3   Total Protein 6.5 - 8.1 g/dL 5.9  7.8  7.5   Total Bilirubin 0.3 - 1.2 mg/dL 0.5  0.7  0.9   Alkaline Phos 38 - 126 U/L 121  155  132   AST 15 - 41 U/L 26  21  24    ALT 0 - 44 U/L 36  29  61     Urine culture < 10K colonies- no gram neg rods RPR NR HIV NR  Impression/recommendation 26 yr female [redacted] weeks  pregnant presenting with fever, suprapubic and low back pain. Temp 103. WBC 15K, speculum /pelvic examination by EDP noted purulent discharge from the cervix, CMT Pt had chlamydia infection in August (01/09/22) she had received one dose ceftriaxone in the ED  She says she took 1 pill after that- could be azithromycin but not sure Now has Trichomonas/  GC/Chl endocervix NAAT neg  PID_ recommend transvaginal ultrasound to confirm it as PID is not very common in pregnancy.  There are no clear guidelines on managing PID in pregnancy except we know ,Doxy which is standard of care, cannot be used PID is a usually a polymicrobial infection Common organisms causing PID are  chlamydia, GC, vaginal anerobes, mycoplasma, and other organisms like e.coli, enterococcus, strep can all be in the mix  Recommendation  Transvaginal ultrasound to look for tuboovarian abscess, collection in pouch of douglas- as this would alter the management  Continue IV ceftriaxone , azithro and metronidazole for 3 days and if no fever for 24 hrs without tylenol then can switch to PO .She will need a total of 14 days of antibiotics counting  the days of IV) The PO antibiotic choice will be based on the TVU  finding... Azithromycin 250mg  Qd + metronidazole 500mg  Po BID  and whether augmentin need to be added will be decided.  Discussed the management in detail with Dr.Jackson

## 2022-02-27 LAB — CULTURE, BLOOD (ROUTINE X 2)
Culture: NO GROWTH
Culture: NO GROWTH
Special Requests: ADEQUATE
Special Requests: ADEQUATE

## 2022-05-12 NOTE — L&D Delivery Note (Signed)
Delivery Note  Susan [name] is a G5P4 at [redacted]w[redacted]d  First Stage: Labor onset: unknown Analgesia /Anesthesia intrapartum: none SROM at  unknown GBS: unknown IP Antibiotics: n/a  Second Stage: Complete dilation at unknown Onset of pushing at unknown FHR second stage unknown bpm   [name] presented to L&D after a precipitous delivery with EMS.   Delivery of a viable baby girl on 08/10/22 at 0949 by EMS personnel  no nuchal cord;  Anterior then posterior shoulders delivered easily with gentle downward traction. Baby placed on mom's chest, and attended to by baby RN Cord double clamped after cessation of pulsation, cut by CNM  Cord blood sample collection: Yes  Collection of cord blood donation N/A Arterial cord blood sample no  Third Stage: Oxytocin bolus started after delivery of infant for hemorrhage prophylaxis  Placenta delivered Susan [name] intact with 3 VC @ 1007 Placenta disposition: discarded per protocol Uterine tone Firm / bleeding small  no laceration identified  Anesthesia for repair: N/A Repair N/A Est. Blood Loss (mL): [id number]  Complications: none  Mom to postpartum.  Baby to Couplet care / Skin to Skin.  Newborn: Information for the patient's newborn:  Susan [name], Susan [name] [id number]  Live born female  Birth Weight:   APGAR: , /  Newborn Delivery   Birth date/time: 08/10[birth year]2022 09:49:00 Delivery type: Vaginal, Spontaneous       Feeding planned: formula feeding  ---------- Susan [name], CNM Certified Nurse Midwife [facility]
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
## 2022-05-19 ENCOUNTER — Inpatient Hospital Stay
Admission: EM | Admit: 2022-05-19 | Discharge: 2022-05-21 | DRG: 832 | Disposition: A | Discharge status: Home / Self Care | Payer: Medicaid Other | Attending: Obstetrics | Admitting: Obstetrics

## 2022-05-19 ENCOUNTER — Encounter: Payer: Self-pay | Admitting: Obstetrics and Gynecology

## 2022-05-19 DIAGNOSIS — R109 Unspecified abdominal pain: Secondary | ICD-10-CM | POA: Diagnosis present

## 2022-05-19 DIAGNOSIS — O34211 Maternal care for low transverse scar from previous cesarean delivery: Secondary | ICD-10-CM | POA: Diagnosis present

## 2022-05-19 DIAGNOSIS — O4692 Antepartum hemorrhage, unspecified, second trimester: Principal | ICD-10-CM | POA: Diagnosis present

## 2022-05-19 DIAGNOSIS — O99212 Obesity complicating pregnancy, second trimester: Secondary | ICD-10-CM | POA: Diagnosis present

## 2022-05-19 DIAGNOSIS — O99332 Smoking (tobacco) complicating pregnancy, second trimester: Secondary | ICD-10-CM | POA: Diagnosis present

## 2022-05-19 DIAGNOSIS — O0990 Supervision of high risk pregnancy, unspecified, unspecified trimester: Principal | ICD-10-CM

## 2022-05-19 DIAGNOSIS — O26892 Other specified pregnancy related conditions, second trimester: Secondary | ICD-10-CM | POA: Diagnosis present

## 2022-05-19 DIAGNOSIS — O365920 Maternal care for other known or suspected poor fetal growth, second trimester, not applicable or unspecified: Secondary | ICD-10-CM | POA: Diagnosis present

## 2022-05-19 DIAGNOSIS — Z3A25 25 weeks gestation of pregnancy: Secondary | ICD-10-CM

## 2022-05-19 DIAGNOSIS — Z3A26 26 weeks gestation of pregnancy: Secondary | ICD-10-CM

## 2022-05-19 DIAGNOSIS — O99320 Drug use complicating pregnancy, unspecified trimester: Secondary | ICD-10-CM | POA: Diagnosis present

## 2022-05-19 DIAGNOSIS — Z8482 Family history of sudden infant death syndrome: Secondary | ICD-10-CM

## 2022-05-19 DIAGNOSIS — O23512 Infections of cervix in pregnancy, second trimester: Secondary | ICD-10-CM | POA: Diagnosis present

## 2022-05-19 DIAGNOSIS — F1290 Cannabis use, unspecified, uncomplicated: Secondary | ICD-10-CM | POA: Diagnosis present

## 2022-05-19 DIAGNOSIS — O09212 Supervision of pregnancy with history of pre-term labor, second trimester: Secondary | ICD-10-CM

## 2022-05-19 DIAGNOSIS — O0932 Supervision of pregnancy with insufficient antenatal care, second trimester: Secondary | ICD-10-CM

## 2022-05-19 DIAGNOSIS — F17210 Nicotine dependence, cigarettes, uncomplicated: Secondary | ICD-10-CM | POA: Diagnosis present

## 2022-05-19 DIAGNOSIS — F1210 Cannabis abuse, uncomplicated: Secondary | ICD-10-CM | POA: Diagnosis present

## 2022-05-19 DIAGNOSIS — F1910 Other psychoactive substance abuse, uncomplicated: Secondary | ICD-10-CM | POA: Diagnosis present

## 2022-05-19 DIAGNOSIS — Z8759 Personal history of other complications of pregnancy, childbirth and the puerperium: Secondary | ICD-10-CM

## 2022-05-19 DIAGNOSIS — O99322 Drug use complicating pregnancy, second trimester: Secondary | ICD-10-CM | POA: Diagnosis present

## 2022-05-19 LAB — WET PREP, GENITAL
Clue Cells Wet Prep HPF POC: NONE SEEN
Sperm: NONE SEEN
Trich, Wet Prep: NONE SEEN
WBC, Wet Prep HPF POC: >=10 — AB (ref <10)
Yeast Wet Prep HPF POC: NONE SEEN

## 2022-05-19 LAB — DIFFERENTIAL
Abs Immature Granulocytes: 0.04 10*3/uL (ref 0.00–0.07)
Basophils Absolute: 0.1 10*3/uL (ref 0.0–0.1)
Basophils Relative: 0 %
Eosinophils Absolute: 0.1 10*3/uL (ref 0.0–0.5)
Eosinophils Relative: 1 %
Immature Granulocytes: 0 %
Lymphocytes Relative: 18 %
Lymphs Abs: 2.2 10*3/uL (ref 0.7–4.0)
Monocytes Absolute: 1 10*3/uL (ref 0.1–1.0)
Monocytes Relative: 8 %
Neutro Abs: 8.8 10*3/uL — ABNORMAL HIGH (ref 1.7–7.7)
Neutrophils Relative %: 73 %

## 2022-05-19 LAB — CBC
HCT: 32.6 % — ABNORMAL LOW (ref 36.0–46.0)
Hemoglobin: 11 g/dL — ABNORMAL LOW (ref 12.0–15.0)
MCH: 29.8 pg (ref 26.0–34.0)
MCHC: 33.7 g/dL (ref 30.0–36.0)
MCV: 88.3 fL (ref 80.0–100.0)
Platelets: 268 10*3/uL (ref 150–400)
RBC: 3.69 MIL/uL — ABNORMAL LOW (ref 3.87–5.11)
RDW: 14.1 % (ref 11.5–15.5)
WBC: 12.1 10*3/uL — ABNORMAL HIGH (ref 4.0–10.5)
nRBC: 0 % (ref 0.0–0.2)

## 2022-05-19 LAB — URINALYSIS, COMPLETE (UACMP) WITH MICROSCOPIC
Bilirubin Urine: NEGATIVE
Glucose, UA: NEGATIVE mg/dL
Hgb urine dipstick: NEGATIVE
Ketones, ur: 20 mg/dL — AB
Nitrite: NEGATIVE
Protein, ur: 30 mg/dL — AB
Specific Gravity, Urine: 1.03 (ref 1.005–1.030)
pH: 6 (ref 5.0–8.0)

## 2022-05-19 LAB — COMPREHENSIVE METABOLIC PANEL
ALT: 21 U/L (ref 0–44)
AST: 22 U/L (ref 15–41)
Albumin: 3.1 g/dL — ABNORMAL LOW (ref 3.5–5.0)
Alkaline Phosphatase: 115 U/L (ref 38–126)
Anion gap: 10 (ref 5–15)
BUN: 13 mg/dL (ref 6–20)
CO2: 23 mmol/L (ref 22–32)
Calcium: 8.9 mg/dL (ref 8.9–10.3)
Chloride: 101 mmol/L (ref 98–111)
Creatinine, Ser: 0.47 mg/dL (ref 0.44–1.00)
GFR, Estimated: >60 mL/min (ref >60)
Glucose, Bld: 94 mg/dL (ref 70–99)
Potassium: 3.4 mmol/L — ABNORMAL LOW (ref 3.5–5.1)
Sodium: 134 mmol/L — ABNORMAL LOW (ref 135–145)
Total Bilirubin: 0.8 mg/dL (ref 0.3–1.2)
Total Protein: 7.3 g/dL (ref 6.5–8.1)

## 2022-05-19 LAB — RAPID HIV SCREEN (HIV 1/2 AB+AG)
HIV 1/2 Antibodies: NONREACTIVE
HIV-1 P24 Antigen - HIV24: NONREACTIVE

## 2022-05-19 LAB — URINE DRUG SCREEN, QUALITATIVE (ARMC ONLY)
Amphetamines, Ur Screen: POSITIVE — AB
Barbiturates, Ur Screen: NOT DETECTED
Benzodiazepine, Ur Scrn: NOT DETECTED
Cannabinoid 50 Ng, Ur ~~LOC~~: POSITIVE — AB
Cocaine Metabolite,Ur ~~LOC~~: NOT DETECTED
MDMA (Ecstasy)Ur Screen: NOT DETECTED
Methadone Scn, Ur: NOT DETECTED
Opiate, Ur Screen: NOT DETECTED
Phencyclidine (PCP) Ur S: NOT DETECTED
Tricyclic, Ur Screen: NOT DETECTED

## 2022-05-19 MED ORDER — NICOTINE 21 MG/24HR TD PT24
21.0000 mg | MEDICATED_PATCH | Freq: Every day | TRANSDERMAL | Status: DC
  Administered 2022-05-19: 21 mg via TRANSDERMAL
  Filled 2022-05-19: qty 1

## 2022-05-19 MED ORDER — AZITHROMYCIN 500 MG PO TABS
1000.0000 mg | ORAL_TABLET | Freq: Once | ORAL | Status: AC
  Administered 2022-05-19: 1000 mg via ORAL
  Filled 2022-05-19: qty 2

## 2022-05-19 MED ORDER — METRONIDAZOLE 500 MG PO TABS
500.0000 mg | ORAL_TABLET | Freq: Two times a day (BID) | ORAL | Status: DC
  Administered 2022-05-19: 500 mg via ORAL
  Filled 2022-05-19: qty 1

## 2022-05-19 MED ORDER — ONDANSETRON HCL 4 MG PO TABS: 4.0000 mg | ORAL_TABLET | Freq: Three times a day (TID) | ORAL | Status: DC | PRN

## 2022-05-19 MED ORDER — CALCIUM CARBONATE ANTACID 500 MG PO CHEW: 2.0000 | CHEWABLE_TABLET | ORAL | Status: DC | PRN

## 2022-05-19 MED ORDER — HYDROXYZINE HCL 25 MG PO TABS
50.0000 mg | ORAL_TABLET | Freq: Once | ORAL | Status: AC | PRN
  Administered 2022-05-20: 50 mg via ORAL
  Filled 2022-05-19: qty 2

## 2022-05-19 MED ORDER — ACETAMINOPHEN 500 MG PO TABS
1000.0000 mg | ORAL_TABLET | Freq: Four times a day (QID) | ORAL | Status: DC | PRN
  Administered 2022-05-19: 1000 mg via ORAL
  Filled 2022-05-19: qty 2

## 2022-05-19 NOTE — H&P (Signed)
HISTORY AND PHYSICAL NOTE  History of Present Illness: Susan [name] is a 27 y.o. [id number] at [redacted]w[redacted]d admitted for vaginal bleeding in 2nd trimester.  She presented to L&D with complaints of vaginal mucus-like discharge, vaginal bleeding, and abdominal pain.  She reports intercourse 2 days ago.  Vaginal discharge has been continuous and feels like it's constantly coming out.  She has a mucus like discharge along with dark red vaginal bleeding.  Denies cramping or contractions but states she has lower abdominal pain and feels like her "insides are on fire".  Endorses fetal movement.   Her current pregnancy is complicated by no prenatal care, marijuana and tobacco use in pregnancy, PID in first trimester, unstable living situation. Her housing situation is not stable and she is currently staying in a "building" without access to a bathroom or running water.  Sui's first baby died of SIDS and she does not have custody currently of her living children.  She currently smokes 2 ppd of tobacco and daily marijuana use.  She also has concerns for limited to no transportation and has not started prenatal care for this pregnancy d/t these concerns.    Reports active fetal movement  Contractions: denies  LOF/SROM: denies  Vaginal bleeding: small amount of dark red vaginal bleeding  Fetal presentation is unsure.  Factors complicating pregnancy:  No prenatal care  Substance use disorder in pregnancy History of preeclampsia with severe features  History of preterm delivery Tobacco use in pregnancy  Marijuana use in pregnancy  Previous LTCS with successful VBAC Obesity in pregnancy   Prenatal care site:  No established prenatal care   Patient Active Problem List   Diagnosis Date Noted   Vaginal bleeding in pregnancy, second trimester 05/19/2022   Marijuana use during pregnancy 05/19/2022   Pelvic inflammatory disease (PID) 02/22/2022   Substance abuse complicating pregnancy in second trimester,  antepartum (HCC) 04/29/2021   Abdominal pain during pregnancy in second trimester 04/27/2021   No prenatal care in current pregnancy in second trimester 04/27/2021   Current pregnancy with history of preterm labor, second trimester 04/27/2021   Desires VBAC (vaginal birth after cesarean) trial 02/08/2021   Obesity affecting pregnancy in second trimester 04/25/2020   MDD (major depressive disorder), recurrent episode, severe (HCC) 12/02/2019   Tobacco smoking affecting pregnancy in second trimester 08/21/2017   Family history of SIDS (sudden infant death syndrome) 05-17-17   Supervision of high risk pregnancy, antepartum 04/06/2017   History of pre-eclampsia 04/06/2017   Marijuana abuse 12/07/2016   Abnormal liver function tests 09/18/2016   Chronic headaches 09/18/2016    Past Medical History:  Diagnosis Date   ADHD (attention deficit hyperactivity disorder)    Depression    Preeclampsia, severe, third trimester    HELLP Syndrome    Past Surgical History:  Procedure Laterality Date   NO PAST SURGERIES      OB History  Gravida Para Term Preterm AB Living  5 3 2 1 1 2   SAB IAB Ectopic Multiple Live Births  1 0 0 0 3    # Outcome Date GA Lbr Len/2nd Weight Sex Delivery Anes PTL Lv  5 Current           4 Preterm 04/27/21 [redacted]w[redacted]d 01:55 / 00:07 2670 g F Vag-Spont None  LIV  3 Term 11/03/17 [redacted]w[redacted]d / 00:13 3550 g F Vag-Spont   LIV  2 Term 02/02/16 [redacted]w[redacted]d  3317 g M   N DEC     Complications: Preeclampsia, severe, third  trimester  1 SAB             Social History:  reports that she has been smoking cigarettes. She has been smoking an average of 2 packs per day. She has never used smokeless tobacco. She reports current alcohol use. She reports current drug use. Frequency: 1.00 time per week. Drugs: Marijuana and Methamphetamines.  Family History: family history includes Diabetes in her father, mother, and paternal aunt; SIDS in her son.  No Known Allergies  Medications Prior to  Admission  Medication Sig Dispense Refill Last Dose   acetaminophen (TYLENOL) 500 MG tablet Take 2 tablets (1,000 mg total) by mouth every 6 (six) hours as needed for mild pain (temp >100.4 F). 30 tablet 0    ondansetron (ZOFRAN) 4 MG tablet Take 1 tablet (4 mg total) by mouth every 6 (six) hours as needed for nausea. 20 tablet 0     Review of Systems  Constitutional:  Negative for fever.  Respiratory:  Negative for cough.   Cardiovascular:  Negative for chest pain.  Gastrointestinal:  Positive for abdominal pain. Negative for heartburn and nausea.  Genitourinary:  Positive for dysuria, frequency and urgency.  Musculoskeletal:  Negative for myalgias and neck pain.  Neurological:  Negative for dizziness and headaches.  Psychiatric/Behavioral:  The patient is nervous/anxious.     Physical Examination: Vitals:  LMP  (LMP Unknown)  Physical Exam Constitutional:      Appearance: Normal appearance.  Genitourinary:     Vaginal discharge (copious amount of thick, yellow to green discharge) and erythema present.     Cervical motion tenderness, discharge and friability present.     Cervical exam comments: Visually closed, long.     Uterus is enlarged (c/w [redacted] week gestation).  HENT:     Mouth/Throat:     Mouth: Mucous membranes are moist.  Cardiovascular:     Rate and Rhythm: Normal rate.  Pulmonary:     Effort: Pulmonary effort is normal.  Abdominal:     Palpations: Abdomen is soft.     Tenderness: There is abdominal tenderness.  Musculoskeletal:        General: Normal range of motion.     Cervical back: Normal range of motion.  Neurological:     Mental Status: She is alert and oriented to person, place, and time.  Skin:    General: Skin is cool.     Capillary Refill: Capillary refill takes less than 2 seconds.  Psychiatric:        Mood and Affect: Mood normal.     Membranes: intact FHT:  FHR: 155 bpm, variability: moderate,  accelerations:  Present,  decelerations:   Absent Category/reactivity:  Category I  UC:   none  Labs:  Results for orders placed or performed during the hospital encounter of 05/19/22 (from the past 24 hour(s))  Wet prep, genital   Collection Time: 05/19/22  9:46 PM   Specimen: Urine, Clean Catch  Result Value Ref Range   Yeast Wet Prep HPF POC NONE SEEN NONE SEEN   Trich, Wet Prep NONE SEEN NONE SEEN   Clue Cells Wet Prep HPF POC NONE SEEN NONE SEEN   WBC, Wet Prep HPF POC >=10 (A) <10   Sperm NONE SEEN   Urinalysis, Complete w Microscopic Urine, Clean Catch   Collection Time: 05/19/22  9:46 PM  Result Value Ref Range   Color, Urine YELLOW (A) YELLOW   APPearance HAZY (A) CLEAR   Specific Gravity, Urine 1.030 1.005 -  1.030   pH 6.0 5.0 - 8.0   Glucose, UA NEGATIVE NEGATIVE mg/dL   Hgb urine dipstick NEGATIVE NEGATIVE   Bilirubin Urine NEGATIVE NEGATIVE   Ketones, ur 20 (A) NEGATIVE mg/dL   Protein, ur 30 (A) NEGATIVE mg/dL   Nitrite NEGATIVE NEGATIVE   Leukocytes,Ua SMALL (A) NEGATIVE   RBC / HPF 0-5 0 - 5 RBC/hpf   WBC, UA 11-20 0 - 5 WBC/hpf   Bacteria, UA RARE (A) NONE SEEN   Squamous Epithelial / HPF 0-5 0 - 5 /HPF   Mucus PRESENT     Prenatal Labs: Blood type/Rh O pos  Antibody screen neg  Rubella Pending   Varicella Pending   RPR NR  HBsAg Pending   Hep C Pending   HIV NR  GC Pending   Chlamydia Pending   Genetic screening Unable to collect   1 hour GTT Not due   3 hour GTT N/A  GBS Not due     Imaging Studies: No results found.  Assessment and Plan: Patient Active Problem List   Diagnosis Date Noted   Vaginal bleeding in pregnancy, second trimester 05/19/2022   Marijuana use during pregnancy 05/19/2022   Pelvic inflammatory disease (PID) 02/22/2022   Substance abuse complicating pregnancy in second trimester, antepartum (HCC) 04/29/2021   Abdominal pain during pregnancy in second trimester 04/27/2021   No prenatal care in current pregnancy in second trimester 04/27/2021   Current  pregnancy with history of preterm labor, second trimester 04/27/2021   Desires VBAC (vaginal birth after cesarean) trial 02/08/2021   Obesity affecting pregnancy in second trimester 04/25/2020   MDD (major depressive disorder), recurrent episode, severe (HCC) 12/02/2019   Tobacco smoking affecting pregnancy in second trimester 08/21/2017   Family history of SIDS (sudden infant death syndrome) 2017-05-07   Supervision of high risk pregnancy, antepartum 04/06/2017   History of pre-eclampsia 04/06/2017   Marijuana abuse 12/07/2016   Abnormal liver function tests 09/18/2016   Chronic headaches 09/18/2016    1. Observation to Antenatal -Dr. Jean Rosenthal notified of observation, assessment, social concerns, and plan of care  -Continuous toco with FHT q shift  -Regular diet  -Bathroom privileges  -May shower  -Nicotine patch per protocol  -Routine antenatal care  2. Cervicitis in pregnancy  -Exam today c/w cervicitis in pregnancy.   -Copious amount of purulent discharge is present along with CMT and friable cervix -Wet prep is negative for BV and Trichomonas  -Will treat presumptively for cervicitis and coexisting infections d/t high risk of exposure and trichomonas infection x 2 in in pregnancy   3. No prenatal care  -Prenatal care labs ordered  -OB US ordered for placenta location, AFI, presentation, and EFW  -Will place MFM consult in AM d/t high risk pregnancy with no established care, multiple pelvic infections in pregnancy, history of poor obstetric outcomes.   -I am concerned that [facility] will not follow up d/t social concerns, transportation needs, and unstable living situation.  Will consult TOC in AM to help establish resources and assure safe living environment  Gustavo Lah, CNM  Certified Nurse Midwife [facility]

## 2022-05-19 NOTE — OB Triage Note (Signed)
840pm- Drinda Butts, CNM notified of patients arrival and stated she would be in to assess patient.

## 2022-05-19 NOTE — OB Triage Note (Signed)
Patient arrived to unit with complaints of lower abdominal pain and bleeding. Patient reports intercourse in the last 2 days. Patient reports active fetal movement. Pt is a G5P2. Pt is tearful and concerned that she cannot provide for this baby and is unable to get proper PNC due to no transportation. Patient has no stable living dwelling and is "on and off" with her boyfriend. Patient is requesting a shower as she has not had access to shower or a bathroom with running water. Patient reports her living babies were taken and in custody of the state. Patient reports smoking cigarettes and THC daily smoking/use. Patient placed on EFM and TOCO to non tender area of abdomen. Will notify provider of patient's arrival.

## 2022-05-20 ENCOUNTER — Other Ambulatory Visit: Payer: Self-pay

## 2022-05-20 ENCOUNTER — Observation Stay: Payer: Medicaid Other

## 2022-05-20 ENCOUNTER — Observation Stay (HOSPITAL_BASED_OUTPATIENT_CLINIC_OR_DEPARTMENT_OTHER): Payer: Medicaid Other

## 2022-05-20 VITALS — BP 114/80 | HR 98 | Temp 97.4°F

## 2022-05-20 DIAGNOSIS — O99332 Smoking (tobacco) complicating pregnancy, second trimester: Secondary | ICD-10-CM

## 2022-05-20 DIAGNOSIS — O4692 Antepartum hemorrhage, unspecified, second trimester: Secondary | ICD-10-CM

## 2022-05-20 DIAGNOSIS — O99322 Drug use complicating pregnancy, second trimester: Secondary | ICD-10-CM | POA: Diagnosis present

## 2022-05-20 DIAGNOSIS — F1910 Other psychoactive substance abuse, uncomplicated: Secondary | ICD-10-CM

## 2022-05-20 DIAGNOSIS — Z8759 Personal history of other complications of pregnancy, childbirth and the puerperium: Secondary | ICD-10-CM

## 2022-05-20 DIAGNOSIS — O0990 Supervision of high risk pregnancy, unspecified, unspecified trimester: Secondary | ICD-10-CM

## 2022-05-20 DIAGNOSIS — O365920 Maternal care for other known or suspected poor fetal growth, second trimester, not applicable or unspecified: Secondary | ICD-10-CM | POA: Diagnosis present

## 2022-05-20 DIAGNOSIS — O99212 Obesity complicating pregnancy, second trimester: Secondary | ICD-10-CM

## 2022-05-20 DIAGNOSIS — F17210 Nicotine dependence, cigarettes, uncomplicated: Secondary | ICD-10-CM

## 2022-05-20 DIAGNOSIS — E669 Obesity, unspecified: Secondary | ICD-10-CM

## 2022-05-20 DIAGNOSIS — O0932 Supervision of pregnancy with insufficient antenatal care, second trimester: Secondary | ICD-10-CM

## 2022-05-20 DIAGNOSIS — O23512 Infections of cervix in pregnancy, second trimester: Secondary | ICD-10-CM | POA: Diagnosis present

## 2022-05-20 DIAGNOSIS — O26892 Other specified pregnancy related conditions, second trimester: Secondary | ICD-10-CM

## 2022-05-20 DIAGNOSIS — Z8482 Family history of sudden infant death syndrome: Secondary | ICD-10-CM

## 2022-05-20 DIAGNOSIS — O99320 Drug use complicating pregnancy, unspecified trimester: Secondary | ICD-10-CM

## 2022-05-20 DIAGNOSIS — O34211 Maternal care for low transverse scar from previous cesarean delivery: Secondary | ICD-10-CM | POA: Diagnosis present

## 2022-05-20 DIAGNOSIS — Z3A25 25 weeks gestation of pregnancy: Secondary | ICD-10-CM

## 2022-05-20 DIAGNOSIS — Z363 Encounter for antenatal screening for malformations: Secondary | ICD-10-CM

## 2022-05-20 DIAGNOSIS — Z3A26 26 weeks gestation of pregnancy: Secondary | ICD-10-CM | POA: Diagnosis not present

## 2022-05-20 DIAGNOSIS — O09292 Supervision of pregnancy with other poor reproductive or obstetric history, second trimester: Secondary | ICD-10-CM

## 2022-05-20 DIAGNOSIS — Z3A27 27 weeks gestation of pregnancy: Secondary | ICD-10-CM

## 2022-05-20 DIAGNOSIS — O09212 Supervision of pregnancy with history of pre-term labor, second trimester: Secondary | ICD-10-CM

## 2022-05-20 DIAGNOSIS — F1290 Cannabis use, unspecified, uncomplicated: Secondary | ICD-10-CM | POA: Diagnosis present

## 2022-05-20 LAB — HEPATITIS B SURFACE ANTIGEN: Hepatitis B Surface Ag: NONREACTIVE

## 2022-05-20 LAB — CHLAMYDIA/NGC RT PCR (ARMC ONLY)
Chlamydia Tr: NOT DETECTED
N gonorrhoeae: NOT DETECTED

## 2022-05-20 LAB — RPR: RPR Ser Ql: NONREACTIVE

## 2022-05-20 MED ORDER — METRONIDAZOLE 500 MG/100ML IV SOLN
500.0000 mg | Freq: Two times a day (BID) | INTRAVENOUS | Status: DC
  Administered 2022-05-20 (×2): 500 mg via INTRAVENOUS
  Filled 2022-05-20 (×3): qty 100

## 2022-05-20 MED ORDER — SODIUM CHLORIDE 0.9 % IV SOLN
500.0000 mg | INTRAVENOUS | Status: DC
  Administered 2022-05-20: 500 mg via INTRAVENOUS
  Filled 2022-05-20 (×2): qty 5

## 2022-05-20 NOTE — Consult Note (Addendum)
Maternal-Fetal Medicine   Name: Chantay Whitelock DOB: January 23, 1996 MRN: [id number] Referring Provider: Chari Manning, CNM   I had the pleasure of seeing Ms. Delton See today at [facility].  She is G5 P2112 at 26w 5d gestation and was admitted yesterday with vaginal bleeding.   She had mild abdominal pain.  Patient gives history of sexual intercourse 3 days ago.  On admission examination, copious vaginal discharge was seen.  Gonorrhea/chlamydia screening is negative.  She had insufficient prenatal care.  Her pregnancy is dated by first ultrasound in which only female length was measured and a later transvaginal ultrasound measured only the BPD.  Obstetric history 01/2016: Term vaginal delivery of a female infant weighing 3317 g at birth.  Unfortunately, her son died at 26 months of age (possibly SIDS). 10/2017: Term vaginal delivery of a female infant weighing 3550 g.  Her daughter is in good health. 04/2021: Spontaneous preterm birth at [redacted] weeks gestation of a female infant weighing 2670 g at birth.  She had insufficient prenatal care and a previous pregnancy.  Patient presented to labor and delivery and on examination the cervix was fully dilated.  Her daughter is in good health.  Placenta weight 540 g and there was no evidence of chorioamnionitis or placental abruption.  GYN history: No history of cervical surgeries. Past medical history: No history of diabetes or hypertension or any chronic medical conditions. Past surgical history: Nil of note. Allergies: No known drug allergies. Medications: Nicotine patches, prenatal vitamins, metronidazole. Social history: Patient reports she smokes 2 packs of cigarettes daily.  No history of alcohol use.  She admits to using marijuana and amphetamine.  Labs Hemoglobin 11, hematocrit 32.6, WBC 12.1, platelets 268, electrolytes normal, creatinine 0.47, ALT 21, AST 22, glucose 94.  GC/chlamydia negative/negative.  Ultrasound We performed a  fetal anatomical survey.  Fetal biometry is consistent with 25 weeks and 2 days gestation.  Amniotic fluid is normal and good fetal activity seen.  Fetal anatomical survey appears normal.  Placenta is anterior and there is no evidence of previa.  No evidence of placental abruption.  Umbilical artery Doppler was performed because of suspected fetal growth restriction based on pregnancy dating by LMP.  Normal forward diastolic flow is seen.  Our concerns include: Pregnancy dating and Fetal growth restriction (See ADDENDUM below) She had 2 ultrasounds performed on 02/22/2022 and 02/23/2022 and on both these occasions, complete biometry was not performed.  According to ACOG, pregnancy details.  Based on full biometry measurements.  Based on our ultrasound, fetal biometry is consistent with 25 weeks and 2 days gestation.  We recommend assigning her EDD at 08/31/2022.  I explained the possibility of fetal growth restriction that could be detected only on follow-up ultrasound.  We recommended a follow-up fetal growth assessment in 3 weeks.  She has other factors including smoking that can cause fetal growth restriction. ADDENDUM (05/20/22) After this report was signed, I found that the patient had one ultrasound on 01/09/22 and the CRL measurement was consistent with 8w 3d establishing her EDD at 08/18/22.  Her pregnancy should be dated by this ultrasound. Based on this EDD, today's ultrasound measurements are consistent with severe fetal growth restriction.  I saw the patient again at [facility] unit and counseled her on the finding of fetal growth restriction. Fetal growth restriction I explained the finding of fetal growth restriction that is difficult to differentiate from a constitutionally small for gestational age fetus. I discussed the possible causes of fetal growth  restriction including placental insufficiency (most common cause), and chromosomal anomalies. I explained that only  amniocentesis will give a definitive result on the fetal karyotype and some genetic conditions (Microarray. I discussed cell-free fetal DNA screening. Patient is sleepy and will decide later.  Midtrimester vaginal bleeding I discussed the possible causes of vaginal bleeding including placental abruption, placenta previa and local causes (cervicitis).  I reassured her of normal placental location.  Ultrasound, however, has limitations in diagnosing placental abruption and placental abruption may be missed and up to 50% of cases.  I explained that bleeding could have resulted following sexual intercourse and the presence of cervicitis.  Cigarette Smoking  Smoking increases the risk of maternal complications that include placental abruption. Fetal complications include fetal growth restriction, prematurity, stillbirth, and sudden infant death syndrome (SIDS). Risk of spontaneous abortions is increased.   Smoking cessation at any gestational age is beneficial and should routinely be addressed in her prenatal visits. Patient is on nicotine-replacement therapy (NRT. Its chief benefit is that it does not contain other toxic substances present in cigarettes. Cigarettes, in addition to nicotine and Carbon monoxide, contain several other toxic substances. Overall, the benefit of NRT outweighs cigarette smoking as cigarettes contain several other compounds including carcinogens that are harmful to her and her pregnancy.  Amphetamine  It is a sympathomimetic drug and acts as a central-nervous system stimulant. This drug can be used for illicit abuse. Most studies showed no increase in fetal congenital malformations. Amphetamine withdrawal has been reported in newborns whose mothers have been dependent on this drug. Symptoms of withdrawal include dysphoria, agitation, poor feeding, and lassitude. Low-birth weight and preterm deliveries have also been associated with the use of this drug.   Prenatal Screening I  discussed her prenatal screening for fetal aneuploidies.  I discussed the significance of cell-free fetal DNA screening.  Patient opted not to have screening for aneuploidies.  Recommendations -If vaginal bleeding had stopped, patient may be discharged tomorrow. -Screening for gestational diabetes now. -UA Doppler and NST in 2 weeks (06/03/22 at 2 PM). Kindly inform the patient at discharge. -An appointment was made for her to return in 3 weeks for fetal growth assessment (06/10/22 at 11 AM). -Cell-free fetal DNA screening.  Discussed with Avelino Leeds, CNM  Thank you for consultation.  If you have any questions or concerns, please contact me the [facility].  Consultation including face-to-face counseling (more than 50% of time spent) is 45 minutes.

## 2022-05-20 NOTE — TOC Initial Note (Signed)
Transition of Care [facility]) - Initial/Assessment Note    Patient Details  Name: Susan [name] MRN: [id number] Date of Birth: 02-20-1996  [facility] CM/SW Contact:    Shelbie Hutching, RN Phone Number: 05/20/2022, 4:34 PM  Clinical Narrative:                 Patient admitted to the hospital with vaginal bleeding, substance abuse during pregnancy, and no prenatal care so far.  RNCM met with patient at the bedside.  Patient may be able to discharge tomorrow.  Per patient she has been living in a building behind someone's house, like an out building, she says they have become difficult and she will be leaving there.  She needs to go tomorrow and clean all of her stuff out and get her puppy.  She thinks she will go to her sisters home.  She reports her sister Susan [name] lives in [place] in a house with her 4 kids, she does not give out any other information about her family other than she has an aunt.  Patient does not work, she does not have transportation, she does not have a doctor.  Provided patient with 3 bus passes and information for the [facility] at the health Department.  Instructed her to call the Department of Social Services to ask about Medicaid transportation, they can get her back and forth to her appointments.   Added resources to her discharge paperwork on housing and [employer].  Informed her that [place] may be able to help her find a place to stay as she is a single pregnant vulnerable woman, this number is on the discharge instructions.    Encouraged her to reach out to [facility] if she has any questions or concerns.   Strongly encouraged her to quit smoking or at least cut back to 1 pack per day.    Expected Discharge Plan: Home/Self Care Barriers to Discharge: Continued Medical Work up   Patient Goals and CMS Choice Patient states their goals for this hospitalization and ongoing recovery are:: patient states she will probably  go to her sisters          Expected Discharge Plan and Services   Discharge Planning Services: CM Consult   Living arrangements for the past 2 months: Homeless                           [place] Arranged: NA          Prior Living Arrangements/Services Living arrangements for the past 2 months: Homeless Lives with:: Self Patient language and need for interpreter reviewed:: Yes        Need for Family Participation in Patient Care: Yes (Comment) Care giver support system in place?: No (comment)   Criminal Activity/Legal Involvement Pertinent to Current Situation/Hospitalization: No - Comment as needed  Activities of Daily Living Home Assistive Devices/Equipment: None ADL Screening (condition at time of admission) Patient's cognitive ability adequate to safely complete daily activities?: Yes Is the patient deaf or have difficulty hearing?: No Does the patient have difficulty seeing, even when wearing glasses/contacts?: No Does the patient have difficulty concentrating, remembering, or making decisions?: No Patient able to express need for assistance with ADLs?: Yes Does the patient have difficulty dressing or bathing?: No Independently performs ADLs?: Yes (appropriate for developmental age) Does the patient have difficulty walking or climbing stairs?: No Weakness of Legs: None Weakness of Arms/Hands: None  Permission Sought/Granted                  Emotional Assessment Appearance:: Appears stated age Attitude/Demeanor/Rapport: Avoidant, Guarded, Suspicious Affect (typically observed): Flat Orientation: : Oriented to Self, Oriented to Place, Oriented to  Time, Oriented to Situation Alcohol / Substance Use: Illicit Drugs, Tobacco Use Psych Involvement: No (comment)  Admission diagnosis:  Vaginal bleeding in pregnancy, second trimester [O46.92] Indication for care in labor and delivery, antepartum [O75.9] Patient Active Problem List   Diagnosis Date Noted   [redacted]  weeks gestation of pregnancy 05/20/2022   Indication for care in labor and delivery, antepartum 05/20/2022   Vaginal bleeding in pregnancy, second trimester 05/19/2022   Marijuana use during pregnancy 05/19/2022   Pelvic inflammatory disease (PID) 02/22/2022   Substance abuse complicating pregnancy in second trimester, antepartum (HCC) 04/29/2021   Abdominal pain during pregnancy in second trimester 04/27/2021   No prenatal care in current pregnancy in second trimester 04/27/2021   Current pregnancy with history of preterm labor, second trimester 04/27/2021   Desires VBAC (vaginal birth after cesarean) trial 02/08/2021   Obesity affecting pregnancy in second trimester 04/25/2020   MDD (major depressive disorder), recurrent episode, severe (HCC) 12/02/2019   Tobacco smoking affecting pregnancy in second trimester 08/21/2017   Family history of SIDS (sudden infant death syndrome) 15-May-2017   Supervision of high risk pregnancy, antepartum 04/06/2017   History of pre-eclampsia 04/06/2017   Marijuana abuse 12/07/2016   Abnormal liver function tests 09/18/2016   Chronic headaches 09/18/2016   PCP:  Hildred Laser, MD Pharmacy:   [id number] [place], [place] - [address] AT [facility] [address] [address] [place] [address] Phone: [phone] Fax: [phone]     Social Determinants of Health (SDOH) Social History: SDOH Screenings   Alcohol Screen: [name] Risk  (12/02/2019)  Tobacco Use: High Risk (05/20/2022)   SDOH Interventions:     Readmission Risk Interventions     No data to display

## 2022-05-20 NOTE — Discharge Instructions (Signed)
Transportation Agency Name: [facility] Agency Address: [address] [place], [place] [address] Phone: [phone] Email: [email or web address] Website: [email or web address] Service(s) Offered: [employer], self-sufficiency, congregate meal  program, weatherization program, Administrator, sports program, emergency food assistance,  housing counseling, home ownership program, [place]ork program. September 04, 2016 22  Agency Name: [place] [phone]) Address: [place], [place], [place] [address] Phone: [phone] Email:  Website: [email or web address] Service(s) Offered: Transportation for general public, subscription and demand  response; Dial-a-Ride for citizens 28 years of age or older. Agency Name: [employer] Address: [address] [place], [place] [address] Phone: [phone] Service(s) Offered: Child support services; child welfare services; food stamps;  Medicaid; work first family assistance; and aid with fuel,  rent, food and medicine, transportation assistance.  Agency Name: [employer] [facility]  Network Address: Phone: [phone] Service(s) Offered: Transports veterans to the [facility] medical center. Call  forty-eight hours in advance and leave the name, telephone  number, date, and time of appointment. Veteran will be  contacted by the driver the day before the appointment to  arrange a pick up [place]: [phone] (24 hours a day)    [employer] [employer] [phone]  [place] [phone]  [facility] [phone]  [facility]  [phone]

## 2022-05-20 NOTE — Progress Notes (Addendum)
ANTEPARTUM PROGRESS NOTE  Susan [name] is a 27 y.o. [phone] at [redacted]w[redacted]d who is admitted for vaginal bleeding in 2nd trimester, vaginal discharge and abdominal pain.   Estimated Date of Delivery: 08/18/22. All vaginal cultures were negative. She was evaluated today by Dr Dillard Cannon (see recommendations below) TOC in to see patient and provide resources including housing. Patient states was living in a garage behind a house with her dog and 2 heaters to keep her warm.She notified TOC nurse that she plans to stay with her sister after she is discharged.  Her Pelvic/Abd MRI was negative and did not show any inflammation or masses.  Length of Stay:  0 Days. Admitted 05/19/2022  Subjective:feels better, rates abdominal pain 7/10, and feeling hungry   She reports:  -active fetal movement -no leakage of fluid -no vaginal bleeding -no contractions  Vitals:  BP 130/67 (BP Location: Left Arm)   Pulse 97   Temp 98.5 F (36.9 C) (Axillary)   Resp 18   Ht 5\' 6"  (1.676 m)   Wt 80.7 kg   LMP  (LMP Unknown)   SpO2 99%   BMI 28.73 kg/m  Physical Exam Constitutional:      Appearance: Normal appearance.  Genitourinary:     Vaginal discharge present.  Cardiovascular:     Rate and Rhythm: Normal rate.     Pulses: Normal pulses.  Pulmonary:     Effort: Pulmonary effort is normal.  Abdominal:     Tenderness: There is abdominal tenderness.  Musculoskeletal:        General: Normal range of motion.     Cervical back: Normal range of motion.  Neurological:     Mental Status: She is alert and oriented to person, place, and time.  Skin:    General: Skin is warm and dry.  Psychiatric:        Attention and Perception: Attention normal.        Mood and Affect: Affect is flat.        Behavior: Behavior is slowed and withdrawn.        Thought Content: Thought content normal.      Fetal monitoring: FHR: 145 bpm,  Results for orders placed or performed during the hospital encounter of 05/19/22  (from the past 48 hour(s))  Urinalysis, Complete w Microscopic Urine, Clean Catch     Status: Abnormal   Collection Time: 05/19/22  9:46 PM  Result Value Ref Range   Color, Urine YELLOW (A) YELLOW   APPearance HAZY (A) CLEAR   Specific Gravity, Urine 1.030 1.005 - 1.030   pH 6.0 5.0 - 8.0   Glucose, UA NEGATIVE NEGATIVE mg/dL   Hgb urine dipstick NEGATIVE NEGATIVE   Bilirubin Urine NEGATIVE NEGATIVE   Ketones, ur 20 (A) NEGATIVE mg/dL   Protein, ur 30 (A) NEGATIVE mg/dL   Nitrite NEGATIVE NEGATIVE   Leukocytes,Ua SMALL (A) NEGATIVE   RBC / HPF 0-5 0 - 5 RBC/hpf   WBC, UA 11-20 0 - 5 WBC/hpf   Bacteria, UA RARE (A) NONE SEEN   Squamous Epithelial / HPF 0-5 0 - 5 /HPF   Mucus PRESENT     Comment: Performed at [facility], [address], [place], [place] [address]  Urine Drug Screen, Qualitative ([facility] only)     Status: Abnormal   Collection Time: 05/19/22  9:46 PM  Result Value Ref Range   Tricyclic, Ur Screen NONE DETECTED NONE DETECTED   Amphetamines, Ur Screen POSITIVE (A) NONE DETECTED  MDMA (Ecstasy)Ur Screen NONE DETECTED NONE DETECTED   Cocaine Metabolite,Ur [place] NONE DETECTED NONE DETECTED   Opiate, Ur Screen NONE DETECTED NONE DETECTED   Phencyclidine (PCP) Ur S NONE DETECTED NONE DETECTED   Cannabinoid 50 Ng, Ur [place] POSITIVE (A) NONE DETECTED   Barbiturates, Ur Screen NONE DETECTED NONE DETECTED   Benzodiazepine, Ur Scrn NONE DETECTED NONE DETECTED   Methadone Scn, Ur NONE DETECTED NONE DETECTED    Comment: (NOTE) Tricyclics + metabolites, urine    Cutoff 1000 ng/mL Amphetamines + metabolites, urine  Cutoff 1000 ng/mL MDMA (Ecstasy), urine              Cutoff 500 ng/mL Cocaine Metabolite, urine          Cutoff 300 ng/mL Opiate + metabolites, urine        Cutoff 300 ng/mL Phencyclidine (PCP), urine         Cutoff 25 ng/mL Cannabinoid, urine                 Cutoff 50 ng/mL Barbiturates + metabolites, urine  Cutoff 200 ng/mL Benzodiazepine, urine               Cutoff 200 ng/mL Methadone, urine                   Cutoff 300 ng/mL  The urine drug screen provides only a preliminary, unconfirmed analytical test result and should not be used for non-medical purposes. Clinical consideration and professional judgment should be applied to any positive drug screen result due to possible interfering substances. A more specific alternate chemical method must be used in order to obtain a confirmed analytical result. Gas chromatography / mass spectrometry (GC/MS) is the preferred confirm atory method. Performed at [facility], [address] [place], [place] [address]   [place] rt PCR [facility] only)     Status: None   Collection Time: 05/19/22  9:46 PM   Specimen: Urine, Clean Catch; Genital  Result Value Ref Range   Specimen source GC/Chlam ENDOCERVICAL    Chlamydia Tr NOT DETECTED NOT DETECTED   N gonorrhoeae NOT DETECTED NOT DETECTED    Comment: (NOTE) This CT/NG assay has not been evaluated in patients with a history of  hysterectomy. Performed at [facility], [address] [place], [place] [address]   Wet prep, genital     Status: Abnormal   Collection Time: 05/19/22  9:46 PM   Specimen: Urine, Clean Catch  Result Value Ref Range   Yeast Wet Prep HPF POC NONE SEEN NONE SEEN   Trich, Wet Prep NONE SEEN NONE SEEN   Clue Cells Wet Prep HPF POC NONE SEEN NONE SEEN   WBC, Wet Prep HPF POC >=10 (A) <10   Sperm NONE SEEN     Comment: Performed at [facility], [address] [place], [place] [address]  Hepatitis B surface antigen     Status: None   Collection Time: 05/19/22 11:04 PM  Result Value Ref Range   Hepatitis B Surface Ag NON REACTIVE NON REACTIVE    Comment: Performed at [facility], [place] [address], [place], [place] [address]  RPR     Status: None   Collection Time: 05/19/22 11:04 PM  Result Value Ref Range   RPR Ser Ql NON REACTIVE NON REACTIVE    Comment: Performed at [facility], [place] [address], [place], [place] [address]  CBC     Status: Abnormal   Collection  Time: 05/19/22 11:04 PM  Result Value Ref Range   WBC 12.1 (H) 4.0 - 10.5 K/uL   RBC 3.69 (L) 3.87 - 5.11 MIL/uL   Hemoglobin 11.0 (L) 12.0 - 15.0 g/dL   HCT 32.6 (L) 36.0 - 46.0 %   MCV 88.3 80.0 - 100.0 fL   MCH 29.8 26.0 - 34.0 pg   MCHC 33.7 30.0 - 36.0 g/dL   RDW 14.1 11.5 - 15.5 %   Platelets 268 150 - 400 K/uL   nRBC 0.0 0.0 - 0.2 %    Comment: Performed at [facility], [address] [place], [place] [address]  Differential     Status: Abnormal   Collection Time: 05/19/22 11:04 PM  Result Value Ref Range   Neutrophils Relative % 73 %   Neutro Abs 8.8 (H) 1.7 - 7.7 K/uL   Lymphocytes Relative 18 %   Lymphs Abs 2.2 0.7 - 4.0 K/uL   Monocytes Relative 8 %   Monocytes Absolute 1.0 0.1 - 1.0 K/uL   Eosinophils Relative 1 %   Eosinophils Absolute 0.1 0.0 - 0.5 K/uL   Basophils Relative 0 %   Basophils Absolute 0.1 0.0 - 0.1 K/uL   Immature Granulocytes 0 %   Abs Immature Granulocytes 0.04 0.00 - 0.07 K/uL    Comment: Performed at [facility], [address] [place], [place] [address]  Rapid HIV screen (HIV 1/2 Ab+Ag)     Status: None   Collection Time: 05/19/22 11:04 PM  Result Value Ref Range   HIV-1 P24 Antigen - HIV24 NON REACTIVE NON REACTIVE    Comment: (NOTE) Detection of p24 may be inhibited by biotin in the sample, causing false negative results in acute infection.    HIV 1/2 Antibodies NON REACTIVE NON REACTIVE   Interpretation (HIV Ag Ab)      A non reactive test result means that HIV 1 or HIV 2 antibodies and HIV 1 p24 antigen were not detected in the specimen.    Comment: Performed at [facility], [address] [place], [place] [address]  Comprehensive metabolic panel     Status: Abnormal   Collection Time: 05/19/22 11:04 PM  Result Value Ref Range   Sodium 134 (L) 135 - 145 mmol/L   Potassium 3.4 (L) 3.5 - 5.1 mmol/L   Chloride  101 98 - 111 mmol/L   CO2 23 22 - 32 mmol/L   Glucose, Bld 94 70 - 99 mg/dL    Comment: Glucose reference range applies only to samples taken after fasting for at least 8 hours.   BUN 13 6 - 20 mg/dL   Creatinine, Ser 0.47 0.44 - 1.00 mg/dL   Calcium 8.9 8.9 - 10.3 mg/dL   Total Protein 7.3 6.5 - 8.1 g/dL   Albumin 3.1 (L) 3.5 - 5.0 g/dL   AST 22 15 - 41 U/L   ALT 21 0 - 44 U/L   Alkaline Phosphatase 115 38 - 126 U/L   Total Bilirubin 0.8 0.3 - 1.2 mg/dL   GFR, Estimated >60 >60 mL/min    Comment: (NOTE) Calculated using the CKD-EPI Creatinine Equation (2021)    Anion gap 10 5 - 15    Comment: Performed at [facility], [address], [place],  [address]    [place] MFM OB DETAIL +14 WK  Result Date: 05/20/2022 ----------------------------------------------------------------------  OBSTETRICS REPORT                       (  Signed Final 05/20/2022 11:25 am) ---------------------------------------------------------------------- Patient Info  ID #:       [id number]                          D.O.B.:  04/30/1996 (27 yrs)  Name:       Susan [name]                Visit Date: 05/20/2022 09:25 am ---------------------------------------------------------------------- Performed By  Attending:        Tama High MD        Referred By:      Tama High  Performed By:     Germain Osgood            [facility]                                     [facility] at                                                             [facility] ---------------------------------------------------------------------- Orders  #  Description                           Code        Ordered By  1  [place] MFM OB DETAIL +14 [place]               [address].01    RAVI [facility]  2  [place] MFM UA CORD DOPPLER                768[address].02    RAVI [facility] ----------------------------------------------------------------------  #  Order #                     Accession #                Episode #  1   [id number]                   [id number]                 [id number]  2  [id number]                   [id number]                 [id number] ---------------------------------------------------------------------- Indications  Late prenatal care, second trimester           O09.32  Substance abuse affecting pregnancy,           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 F19.10  antepartum  Vaginal bleeding in pregnancy, second          O46.92  trimester  Poor obstetric history: Previous               O09.299  preeclampsia / eclampsia/gestational HTN  Poor obstetric history: Previous preterm       O09.219  delivery, antepartum (123XX123)  Obesity complicating pregnancy, second  O99.212  trimester  Medical complication of pregnancy (PID)        O26.90  Encounter for antenatal screening for          Z36.3  malformations  [redacted] weeks gestation of pregnancy                Z3A.25 ---------------------------------------------------------------------- Fetal Evaluation  Num Of Fetuses:         1  Fetal Heart Rate(bpm):  164  Cardiac Activity:       Observed  Presentation:           Breech  Placenta:               Anterior  P. Cord Insertion:      Visualized, central  Amniotic Fluid  AFI FV:      Within normal limits                              Largest Pocket(cm)                              5.05 ---------------------------------------------------------------------- Biometry  BPD:     62.51  mm     G. Age:  25w 2d         43  %    CI:         70.1   %    70 - 86                                                          FL/HC:      19.0   %    18.7 - 20.3  HC:    238.15   mm     G. Age:  25w 6d         49  %    HC/AC:      1.16        1.04 - 1.22  AC:    205.54   mm     G. Age:  25w 1d         36  %    FL/BPD:     72.4   %    71 - 87  FL:      45.23  mm     G. Age:  25w 0d         26  %    FL/AC:      22.0   %    20 - 24  HUM:      40.7  mm     G. Age:  24w 5d         29  %  CER:      28.7  mm     G. Age:  25w 2d         52  %  LV:        2.5  mm  CM:        7.1  mm   Est. FW:     776  gm    1 lb 11 oz      34  % ---------------------------------------------------------------------- OB History  Gravidity:    5  Term:   2        Prem:   1        SAB:   1  Living:       2 ---------------------------------------------------------------------- Gestational Age  U/S Today:     25w 2d                                        EDD:   08/31/22  Best:          25w 2d     Det. By:  U/S (05/20/22)           EDD:   08/31/22 ---------------------------------------------------------------------- Anatomy  Cranium:               Appears normal         Aortic Arch:            Appears normal  Cavum:                 Appears normal         Ductal Arch:            Appears normal  Ventricles:            Appears normal         Diaphragm:              Appears normal  Choroid Plexus:        Appears normal         Stomach:                Appears normal, left                                                                        sided  Cerebellum:            Appears normal         Abdomen:                Appears normal  Posterior Fossa:       Appears normal         Abdominal Wall:         Appears nml (cord                                                                        insert, abd wall)  Nuchal Fold:           Not applicable ([id number]    Cord Vessels:           Appears normal ([redacted]                         wks GA)  vessel cord)  Face:                  Orbits nl; profile not Kidneys:                Appear normal                         well visualized  Lips:                  Appears normal         Bladder:                Appears normal  Thoracic:              Appears normal         Spine:                  Appears normal  Heart:                 Appears normal         Upper Extremities:      Appears normal                         (4CH, axis, and                         situs)  RVOT:                  Not well visualized    Lower Extremities:      Appears normal  LVOT:                   Appears normal  Other:  3VV and 3VTV visualized. Hands and feet visualized. Female gender          previously seen. ---------------------------------------------------------------------- Doppler - Fetal Vessels  Umbilical Artery   S/D     %tile      RI    %tile      PI    %tile     PSV    ADFV    RDFV                                                     (cm/s)    3.6       63    0.72       66    1.17       67    38.65      No      No ---------------------------------------------------------------------- Cervix Uterus Adnexa  Cervix  Length:            3.7  cm.  Normal appearance by transabdominal scan  Uterus  No abnormality visualized.  Right Ovary  Not visualized.  Left Ovary  Not visualized.  Cul De Sac  No free fluid seen.  Adnexa  No adnexal mass visualized ---------------------------------------------------------------------- Impression  We performed a fetal anatomical survey.  Fetal biometry is  consistent with 25 weeks and 2 days gestation.  Amniotic  fluid is normal and good fetal activity seen.  Fetal anatomical  survey appears normal.  Placenta is anterior and there is no  evidence of previa.  No evidence of placental  abruption.  Umbilical artery Doppler was performed because of  suspected fetal growth restriction based on pregnancy dating  by LMP.  Normal forward diastolic flow is seen.  xxxxxxxxxxxxxxxxxxxxxxxxxxxxxxxxxxxxxxxxxxxxxxxxxxxxx  Consultation  I had the pleasure of seeing [name] today at [facility].  She is G5 P2112 at 26w 5d gestation  and was admitted yesterday with vaginal bleeding.  Thank you for consultation.  If you have any questions or  concerns, please contact me the [facility].  Consultation including face-to-face counseling (more  than 50% of time spent) is 45 minutes. She had mild  abdominal pain.  Patient gives history of sexual intercourse 3  days ago.  On admission examination, copious vaginal  discharge was seen.   Gonorrhea/chlamydia screening is  negative.  She had insufficient prenatal care.  Her pregnancy is dated  by first ultrasound in which only female length was measured  and a later transvaginal ultrasound measured only the BPD.  Obstetric history  01/2016: Term vaginal delivery of a female infant weighing  3317 g at birth.  Unfortunately, her son died at 61 months of  age (possibly SIDS).  10/2017: Term vaginal delivery of a female infant weighing  3550 g.  Her daughter is in good health.  04/2021: Spontaneous preterm birth at [redacted] weeks gestation of  a female infant weighing 2670 g at birth.  She had insufficient  prenatal care and a previous pregnancy.  Patient presented  to labor and delivery and on examination the cervix was fully  dilated.  Her daughter is in good health.  Placenta weight 540  g and there was no evidence of chorioamnionitis or placental  abruption.  GYN history: No history of cervical surgeries.  Past medical history: No history of diabetes or hypertension  or any chronic medical conditions.  Past surgical history: Nil of note.  Allergies: No known drug allergies.  Medications: Nicotine patches, prenatal vitamins,  metronidazole.  Social history: Patient reports she smokes 2 packs of  cigarettes daily.  No history of alcohol use.  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  Labs  Hemoglobin 11, hematocrit 32.6, WBC 12.1, platelets 268,  electrolytes normal, creatinine 0.47, ALT 21, AST 22, glucose  94.  GC/chlamydia negative/negative.  Our concerns include:  Pregnancy dating  She had 2 ultrasounds performed on 02/22/2022 and  02/23/2022 and on both these occasions, complete biometry  was not performed.  According to ACOG, pregnancy details.  Based on full biometry measurements.  Based on our  ultrasound, fetal biometry is consistent with 25 weeks and 2  days gestation.  We recommend assigning her EDD at 08/31/2022.  I explained the possibility of fetal growth restriction that could  be detected  only on follow-up ultrasound.  We recommended  a follow-up fetal growth assessment in 3 weeks.  She has  other factors including smoking that can cause fetal growth  restriction.  Midtrimester vaginal bleeding  I discussed the possible causes of vaginal bleeding including  placental abruption, placenta previa and local causes  (cervicitis).  I reassured her of normal placental location.  Ultrasound, however, has limitations in diagnosing placental  abruption and placental abruption may be missed and up to  50% of cases.  I explained that bleeding could have resulted  following sexual intercourse and the presence of cervicitis.  Cigarette Smoking  Smoking increases the risk of maternal complications that  include placental abruption. Fetal complications include fetal  growth restriction, prematurity, stillbirth,  and sudden infant  death syndrome (SIDS). Risk of spontaneous abortions is  increased.  Smoking cessation at any gestational age is beneficial and  should routinely be addressed in her prenatal visits. Patient is  on nicotine-replacement therapy (NRT. Its chief benefit is that  it does not contain other toxic substances present in  cigarettes. Cigarettes, in addition to nicotine and Carbon  monoxide, contain several other toxic substances.  Overall, the benefit of NRT outweighs cigarette smoking as  cigarettes contain several other compounds including  carcinogens that are harmful to her and her pregnancy.  Amphetamine  It is a sympathomimetic drug and acts as a central-nervous  system stimulant. This drug can be used for illicit abuse. Most  studies showed no increase in fetal congenital malformations.  Amphetamine withdrawal has been reported in newborns  whose mothers have been dependent on this drug.  Symptoms of withdrawal include dysphoria, agitation, poor  feeding, and lassitude. Low-birth weight and preterm  deliveries have also been associated with the use of this  drug.  Prenatal Screening  I  discussed her prenatal screening for fetal aneuploidies.  I  discussed the significance of cell-free fetal DNA screening.  Patient opted not to have screening for aneuploidies. ---------------------------------------------------------------------- Recommendations  -If vaginal bleeding had stopped, patient may be discharged  tomorrow.  -Screening for gestational diabetes now.  -An appointment was made for her to return in 3 weeks for  fetal growth assessment. ----------------------------------------------------------------------                 Tama High, MD Electronically Signed Final Report   05/20/2022 11:25 am ----------------------------------------------------------------------  [place] MFM UA CORD DOPPLER  Result Date: 05/20/2022 ----------------------------------------------------------------------  OBSTETRICS REPORT                       (Signed Final 05/20/2022 11:25 am) ---------------------------------------------------------------------- Patient Info  ID #:       [id number]                          D.O.B.:  03/06/1996 (27 yrs)  Name:       Susan [name]                Visit Date: 05/20/2022 09:25 am ---------------------------------------------------------------------- Performed By  Attending:        Tama High MD        Referred By:      Tama High  Performed By:     Germain Osgood            [facility]         [facility]                    RD[facility]                                     F[facility] Care at                                                             [facility] ---------------------------------------------------------------------- Orders  #  Description  Code        Ordered By  1  [place] MFM OB DETAIL +14 [place]               [id number]    Dante  2  [place] MFM UA CORD DOPPLER                76820.02    RAVI [facility] ----------------------------------------------------------------------  #  Order #                     Accession #                Episode #  1  [id number]                    [id number]                 [id number]  2  [id number]                   [id number]                 [id number] ---------------------------------------------------------------------- Indications  Late prenatal care, second trimester           O09.32  Substance abuse affecting pregnancy,           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.320 F19.10  antepartum  Vaginal bleeding in pregnancy, second          O46.92  trimester  Poor obstetric history: Previous               O09.299  preeclampsia / eclampsia/gestational HTN  Poor obstetric history: Previous preterm       O09.219  delivery, antepartum (123XX123)  Obesity complicating pregnancy, second         O99.212  trimester  Medical complication of pregnancy (PID)        O26.90  Encounter for antenatal screening for          Z36.3  malformations  [redacted] weeks gestation of pregnancy                Z3A.25 ---------------------------------------------------------------------- Fetal Evaluation  Num Of Fetuses:         1  Fetal Heart Rate(bpm):  164  Cardiac Activity:       Observed  Presentation:           Breech  Placenta:               Anterior  P. Cord Insertion:      Visualized, central  Amniotic Fluid  AFI FV:      Within normal limits                              Largest Pocket(cm)                              5.05 ---------------------------------------------------------------------- Biometry  BPD:     62.51  mm     G. Age:  25w 2d         43  %    CI:         70.1   %    70 - 86  FL/HC:      19.0   %    18.7 - 20.3  HC:    238.15   mm     G. Age:  25w 6d         49  %    HC/AC:      1.16        1.04 - 1.22  AC:    205.54   mm     G. Age:  25w 1d         36  %    FL/BPD:     72.4   %    71 - 87  FL:      45.23  mm     G. Age:  25w 0d         26  %    FL/AC:      22.0   %    20 - 24  HUM:      40.7  mm     G. Age:  24w 5d         29  %  CER:      28.7  mm     G. Age:  25w 2d         52  %  LV:        2.5  mm  CM:        7.1  mm  Est. FW:      776  gm    1 lb 11 oz      34  % ---------------------------------------------------------------------- OB History  Gravidity:    5         Term:   2        Prem:   1        SAB:   1  Living:       2 ---------------------------------------------------------------------- Gestational Age  U/S Today:     25w 2d                                        EDD:   08/31/22  Best:          25w 2d     Det. By:  U/S (05/20/22)           EDD:   08/31/22 ---------------------------------------------------------------------- Anatomy  Cranium:               Appears normal         Aortic Arch:            Appears normal  Cavum:                 Appears normal         Ductal Arch:            Appears normal  Ventricles:            Appears normal         Diaphragm:              Appears normal  Choroid Plexus:        Appears normal         Stomach:                Appears normal, left  sided  Cerebellum:            Appears normal         Abdomen:                Appears normal  Posterior Fossa:       Appears normal         Abdominal Wall:         Appears nml (cord                                                                        insert, abd wall)  Nuchal Fold:           Not applicable ([id number]    Cord Vessels:           Appears normal ([redacted]                         wks GA)                                        vessel cord)  Face:                  Orbits nl; profile not Kidneys:                Appear normal                         well visualized  Lips:                  Appears normal         Bladder:                Appears normal  Thoracic:              Appears normal         Spine:                  Appears normal  Heart:                 Appears normal         Upper Extremities:      Appears normal                         (4CH, axis, and                         situs)  RVOT:                  Not well visualized    Lower Extremities:      Appears normal  LVOT:                   Appears normal  Other:  3VV and 3VTV visualized. Hands and feet visualized. Female gender          previously seen. ---------------------------------------------------------------------- Doppler - Fetal Vessels  Umbilical Artery   S/D     %tile      RI    %  tile      PI    %tile     PSV    ADFV    RDFV                                                     (cm/s)    3.6       63    0.72       66    1.17       67    38.65      No      No ---------------------------------------------------------------------- Cervix Uterus Adnexa  Cervix  Length:            3.7  cm.  Normal appearance by transabdominal scan  Uterus  No abnormality visualized.  Right Ovary  Not visualized.  Left Ovary  Not visualized.  Cul De Sac  No free fluid seen.  Adnexa  No adnexal mass visualized ---------------------------------------------------------------------- Impression  We performed a fetal anatomical survey.  Fetal biometry is  consistent with 25 weeks and 2 days gestation.  Amniotic  fluid is normal and good fetal activity seen.  Fetal anatomical  survey appears normal.  Placenta is anterior and there is no  evidence of previa.  No evidence of placental abruption.  Umbilical artery Doppler was performed because of  suspected fetal growth restriction based on pregnancy dating  by LMP.  Normal forward diastolic flow is seen.  xxxxxxxxxxxxxxxxxxxxxxxxxxxxxxxxxxxxxxxxxxxxxxxxxxxxx  Consultation  I had the pleasure of seeing [name] today at [facility].  She is G5 P2112 at 26w 5d gestation  and was admitted yesterday with vaginal bleeding.  Thank you for consultation.  If you have any questions or  concerns, please contact me the [facility].  Consultation including face-to-face counseling (more  than 50% of time spent) is 45 minutes. She had mild  abdominal pain.  Patient gives history of sexual intercourse 3  days ago.  On admission examination, copious vaginal  discharge was seen.  Gonorrhea/chlamydia  screening is  negative.  She had insufficient prenatal care.  Her pregnancy is dated  by first ultrasound in which only female length was measured  and a later transvaginal ultrasound measured only the BPD.  Obstetric history  01/2016: Term vaginal delivery of a female infant weighing  3317 g at birth.  Unfortunately, her son died at 39 months of  age (possibly SIDS).  10/2017: Term vaginal delivery of a female infant weighing  3550 g.  Her daughter is in good health.  04/2021: Spontaneous preterm birth at [redacted] weeks gestation of  a female infant weighing 2670 g at birth.  She had insufficient  prenatal care and a previous pregnancy.  Patient presented  to labor and delivery and on examination the cervix was fully  dilated.  Her daughter is in good health.  Placenta weight 540  g and there was no evidence of chorioamnionitis or placental  abruption.  GYN history: No history of cervical surgeries.  Past medical history: No history of diabetes or hypertension  or any chronic medical conditions.  Past surgical history: Nil of note.  Allergies: No known drug allergies.  Medications: Nicotine patches, prenatal vitamins,  metronidazole.  Social history: Patient reports she smokes 2 packs of  cigarettes daily.  No history of alcohol use.  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  Labs  Hemoglobin 11, hematocrit 32.6, WBC 12.1, platelets 268,  electrolytes normal, creatinine 0.47, ALT 21, AST 22, glucose  94.  GC/chlamydia negative/negative.  Our concerns include:  Pregnancy dating  She had 2 ultrasounds performed on 02/22/2022 and  02/23/2022 and on both these occasions, complete biometry  was not performed.  According to ACOG, pregnancy details.  Based on full biometry measurements.  Based on our  ultrasound, fetal biometry is consistent with 25 weeks and 2  days gestation.  We recommend assigning her EDD at 08/31/2022.  I explained the possibility of fetal growth restriction that could  be detected only on follow-up  ultrasound.  We recommended  a follow-up fetal growth assessment in 3 weeks.  She has  other factors including smoking that can cause fetal growth  restriction.  Midtrimester vaginal bleeding  I discussed the possible causes of vaginal bleeding including  placental abruption, placenta previa and local causes  (cervicitis).  I reassured her of normal placental location.  Ultrasound, however, has limitations in diagnosing placental  abruption and placental abruption may be missed and up to  50% of cases.  I explained that bleeding could have resulted  following sexual intercourse and the presence of cervicitis.  Cigarette Smoking  Smoking increases the risk of maternal complications that  include placental abruption. Fetal complications include fetal  growth restriction, prematurity, stillbirth, and sudden infant  death syndrome (SIDS). Risk of spontaneous abortions is  increased.  Smoking cessation at any gestational age is beneficial and  should routinely be addressed in her prenatal visits. Patient is  on nicotine-replacement therapy (NRT. Its chief benefit is that  it does not contain other toxic substances present in  cigarettes. Cigarettes, in addition to nicotine and Carbon  monoxide, contain several other toxic substances.  Overall, the benefit of NRT outweighs cigarette smoking as  cigarettes contain several other compounds including  carcinogens that are harmful to her and her pregnancy.  Amphetamine  It is a sympathomimetic drug and acts as a central-nervous  system stimulant. This drug can be used for illicit abuse. Most  studies showed no increase in fetal congenital malformations.  Amphetamine withdrawal has been reported in newborns  whose mothers have been dependent on this drug.  Symptoms of withdrawal include dysphoria, agitation, poor  feeding, and lassitude. Low-birth weight and preterm  deliveries have also been associated with the use of this  drug.  Prenatal Screening  I discussed her prenatal  screening for fetal aneuploidies.  I  discussed the significance of cell-free fetal DNA screening.  Patient opted not to have screening for aneuploidies. ---------------------------------------------------------------------- Recommendations  -If vaginal bleeding had stopped, patient may be discharged  tomorrow.  -Screening for gestational diabetes now.  -An appointment was made for her to return in 3 weeks for  fetal growth assessment. ----------------------------------------------------------------------                 Noralee Space, MD Electronically Signed Final Report   05/20/2022 11:25 am ----------------------------------------------------------------------  US OB Limited  Result Date: 05/20/2022 CLINICAL DATA:  Vaginal bleeding. EXAM: LIMITED OBSTETRIC ULTRASOUND COMPARISON:  None Available. FINDINGS: Number of Fetuses: 1 Heart Rate:  150 bpm Movement: Yes Presentation: Transverse Placental Location: Anterior Previa: No Amniotic Fluid (Subjective):  Within normal limits. AFI: 14.3 cm BPD: 6.2 cm 25 w  0 d MATERNAL FINDINGS: Cervix:  Appears closed. Uterus/Adnexae: No abnormality visualized. IMPRESSION: Single, viable intrauterine pregnancy at approximately 25 weeks and  0 days gestation by ultrasound evaluation. This exam is performed on an emergent basis and does not comprehensively evaluate fetal size, dating, or anatomy; follow-up complete OB US should be considered if further fetal assessment is warranted. Electronically Signed   By: Virgina Norfolk M.D.   On: 05/20/2022 02:19    Current scheduled medications  nicotine  21 mg Transdermal Q0600    I have reviewed the patient's current medications.  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, antepartum ([place]) 04/29/2021   Abdominal pain during pregnancy in second trimester 04/27/2021   No prenatal care in current pregnancy in second trimester 04/27/2021   Current pregnancy with history of preterm labor, second trimester 04/27/2021   Desires VBAC (vaginal birth after cesarean) trial 02/08/2021   Obesity affecting pregnancy in second trimester 04/25/2020   MDD (major depressive disorder), recurrent episode, severe ([place]) 12/02/2019   Tobacco smoking affecting pregnancy in second trimester 08/21/2017   Family history of SIDS (sudden infant death syndrome) 02-May-2017   Supervision of high risk pregnancy, antepartum 04/06/2017   History of pre-eclampsia 04/06/2017   Marijuana abuse 12/07/2016   Abnormal liver function tests 09/18/2016   Chronic headaches 09/18/2016    PLAN: IV Azithromycin/Flagyl Urine culture pending Maintain Nicotine patch Fetal Dopplers Q Shift 1 hr gtt ordered for AM MRI- unremarkable Consult to [facility] consult MFM Recommendations -If vaginal bleeding had stopped, patient may be discharged tomorrow. -Screening for gestational diabetes now. -UA Doppler and NST in 2 weeks (06/03/22 at 2 PM). Kindly inform the patient at discharge. -An appointment was made for her to return in 3 weeks for fetal growth assessment (06/10/22 at 11 AM). -Cell-free fetal DNA screening.  Continue routine antenatal care.  Avelino Leeds, CNM Certified Nurse Midwife [facility]

## 2022-05-21 ENCOUNTER — Other Ambulatory Visit: Payer: Self-pay

## 2022-05-21 LAB — URINE CULTURE: Culture: NO GROWTH

## 2022-05-21 LAB — HCV INTERPRETATION

## 2022-05-21 LAB — RUBELLA SCREEN: Rubella: 1.52 index (ref >0.99)

## 2022-05-21 LAB — HCV AB W REFLEX TO QUANT PCR: HCV Ab: NONREACTIVE

## 2022-05-21 LAB — GLUCOSE, CAPILLARY: Glucose-Capillary: 100 mg/dL — ABNORMAL HIGH (ref 70–99)

## 2022-05-21 LAB — GLUCOSE TOLERANCE, 1 HOUR: Glucose, 1 Hour GTT: 138 mg/dL

## 2022-05-21 LAB — VARICELLA ZOSTER ANTIBODY, IGG: Varicella IgG: <135 index — ABNORMAL LOW (ref >165)

## 2022-05-21 MED ORDER — ONDANSETRON HCL 4 MG PO TABS
4.0000 mg | ORAL_TABLET | Freq: Three times a day (TID) | ORAL | 0 refills | Status: AC | PRN
  Filled 2022-05-21: qty 20, 7d supply, fill #0

## 2022-05-21 MED ORDER — NICOTINE 21 MG/24HR TD PT24
21.0000 mg | MEDICATED_PATCH | Freq: Every day | TRANSDERMAL | 0 refills | Status: AC
  Filled 2022-05-21: qty 14, 14d supply, fill #0

## 2022-05-21 MED ORDER — METRONIDAZOLE 500 MG PO TABS
500.0000 mg | ORAL_TABLET | Freq: Two times a day (BID) | ORAL | 0 refills | Status: AC
  Filled 2022-05-21: qty 14, 7d supply, fill #0

## 2022-05-21 MED ORDER — CALCIUM CARBONATE ANTACID 500 MG PO CHEW: 2.0000 | CHEWABLE_TABLET | ORAL | Status: AC | PRN

## 2022-05-21 NOTE — TOC Progression Note (Signed)
Transition of Care [facility]) - Progression Note    Patient Details  Name: Susan [name] MRN: [id number] Date of Birth: 10-Oct-1995  [facility] CM/SW Contact  Shelbie Hutching, RN Phone Number: 05/21/2022, 9:22 AM  Clinical Narrative:    RNCM met with patient again at the bedside this morning.  Patient is happy about being discharged today.  She reports she will be going to her sister's home.  Gave her the number for [facility] Number [phone]- RNCM reached out to them this morning, they said they may be able to help her depending on her situation, she will need to call.  If they cannot help they can put her in touch with community resources that can.   Patient has a friend at the bedside, she is not sure how she will get to her sister's she may use one of the bus passes I gave her yesterday.    Expected Discharge Plan: Home/Self Care Barriers to Discharge: Continued Medical Work up  Expected Discharge Plan and Services   Discharge Planning Services: CM Consult   Living arrangements for the past 2 months: Homeless                           HH Arranged: NA           Social Determinants of Health (SDOH) Interventions SDOH Screenings   Alcohol Screen: Low Risk  (12/02/2019)  Tobacco Use: High Risk (05/20/2022)    Readmission Risk Interventions     No data to display

## 2022-05-21 NOTE — Progress Notes (Addendum)
Patient will be discharged to sister's home via taxi cab voucher from [facility]. Discharge instructions, when to follow up, and prescriptions reviewed with patient. Patient verbalized understanding. Patient will be escorted out by auxiliary.    Edit: Patient's sister is now going to pick up patient for discharge. TOC updated.

## 2022-05-21 NOTE — Discharge Summary (Signed)
Patient ID: Susan [name] MRN: [id number] DOB/AGE: [birth year] 27 y.o.  Admit date: 05/19/2022 Discharge date: 05/21/2022  Admission Diagnoses: vaginal bleeding, 26wks, pelvic pain  Discharge Diagnoses: severe IUGR, 27wks, no prenatal care  Prenatal Procedures: NST and ultrasound  Consults: Neonatology, Maternal Fetal Medicine  Significant Diagnostic Studies:  Results for orders placed or performed during the hospital encounter of 05/19/22 (from the past 168 hour(s))  Chlamydia/NGC rt PCR ([place] only)   Collection Time: 05/19/22  9:46 PM   Specimen: Urine, Clean Catch; Genital  Result Value Ref Range   Specimen source GC/Chlam ENDOCERVICAL    Chlamydia Tr NOT DETECTED NOT DETECTED   N gonorrhoeae NOT DETECTED NOT DETECTED  Wet prep, genital   Collection Time: 05/19/22  9:46 PM   Specimen: Urine, Clean Catch  Result Value Ref Range   Yeast Wet Prep HPF POC NONE SEEN NONE SEEN   Trich, Wet Prep NONE SEEN NONE SEEN   Clue Cells Wet Prep HPF POC NONE SEEN NONE SEEN   WBC, Wet Prep HPF POC >=10 (A) <10   Sperm NONE SEEN   Urine Culture   Collection Time: 05/19/22  9:46 PM   Specimen: Urine, Clean Catch  Result Value Ref Range   Specimen Description      URINE, CLEAN CATCH Performed at [facility], [address], [place], [place] [address]    Special Requests      NONE Performed at [facility], [address] [place], [place] [address]    Culture      NO GROWTH Performed at [facility], [place] [address], [place], [place] [address]    Report Status 05/21/2022 FINAL   Urinalysis, Complete w Microscopic Urine, Clean Catch   Collection Time: 05/19/22  9:46 PM  Result Value Ref Range   Color, Urine YELLOW (A) YELLOW   APPearance HAZY (A) CLEAR   Specific Gravity, Urine 1.030 1.005 - 1.030   pH 6.0 5.0 - 8.0   Glucose, UA NEGATIVE NEGATIVE mg/dL   Hgb urine dipstick NEGATIVE NEGATIVE   Bilirubin Urine NEGATIVE NEGATIVE   Ketones, ur 20 (A)  NEGATIVE mg/dL   Protein, ur 30 (A) NEGATIVE mg/dL   Nitrite NEGATIVE NEGATIVE   Leukocytes,Ua SMALL (A) NEGATIVE   RBC / HPF 0-5 0 - 5 RBC/hpf   WBC, UA 11-20 0 - 5 WBC/hpf   Bacteria, UA RARE (A) NONE SEEN   Squamous Epithelial / HPF 0-5 0 - 5 /HPF   Mucus PRESENT   Urine Drug Screen, Qualitative ([facility] only)   Collection Time: 05/19/22  9:46 PM  Result Value Ref Range   Tricyclic, Ur Screen NONE DETECTED NONE DETECTED   Amphetamines, Ur Screen POSITIVE (A) NONE DETECTED   MDMA (Ecstasy)Ur Screen NONE DETECTED NONE DETECTED   Cocaine Metabolite,Ur [place] NONE DETECTED NONE DETECTED   Opiate, Ur Screen NONE DETECTED NONE DETECTED   Phencyclidine (PCP) Ur S NONE DETECTED NONE DETECTED   Cannabinoid 50 Ng, Ur [place] POSITIVE (A) NONE DETECTED   Barbiturates, Ur Screen NONE DETECTED NONE DETECTED   Benzodiazepine, Ur Scrn NONE DETECTED NONE DETECTED   Methadone Scn, Ur NONE DETECTED NONE DETECTED  Hepatitis B surface antigen   Collection Time: 05/19/22 11:04 PM  Result Value Ref Range   Hepatitis B Surface Ag NON REACTIVE NON REACTIVE  Rubella screen   Collection Time: 05/19/22 11:04 PM  Result Value Ref Range   Rubella 1.52 Immune >0.99 index  RPR   Collection Time: 05/19/22  11:04 PM  Result Value Ref Range   RPR Ser Ql NON REACTIVE NON REACTIVE  CBC   Collection Time: 05/19/22 11:04 PM  Result Value Ref Range   WBC 12.1 (H) 4.0 - 10.5 K/uL   RBC 3.69 (L) 3.87 - 5.11 MIL/uL   Hemoglobin 11.0 (L) 12.0 - 15.0 g/dL   HCT 67.3 (L) 41.9 - 37.9 %   MCV 88.3 80.0 - 100.0 fL   MCH 29.8 26.0 - 34.0 pg   MCHC 33.7 30.0 - 36.0 g/dL   RDW 02.4 09.7 - 35.3 %   Platelets 268 150 - 400 K/uL   nRBC 0.0 0.0 - 0.2 %  Differential   Collection Time: 05/19/22 11:04 PM  Result Value Ref Range   Neutrophils Relative % 73 %   Neutro Abs 8.8 (H) 1.7 - 7.7 K/uL   Lymphocytes Relative 18 %   Lymphs Abs 2.2 0.7 - 4.0 K/uL   Monocytes Relative 8 %   Monocytes Absolute 1.0 0.1 - 1.0 K/uL    Eosinophils Relative 1 %   Eosinophils Absolute 0.1 0.0 - 0.5 K/uL   Basophils Relative 0 %   Basophils Absolute 0.1 0.0 - 0.1 K/uL   Immature Granulocytes 0 %   Abs Immature Granulocytes 0.04 0.00 - 0.07 K/uL  Rapid HIV screen (HIV 1/2 Ab+Ag)   Collection Time: 05/19/22 11:04 PM  Result Value Ref Range   HIV-1 P24 Antigen - HIV24 NON REACTIVE NON REACTIVE   HIV 1/2 Antibodies NON REACTIVE NON REACTIVE   Interpretation (HIV Ag Ab)      A non reactive test result means that HIV 1 or HIV 2 antibodies and HIV 1 p24 antigen were not detected in the specimen.  HCV Ab w Reflex to Quant PCR   Collection Time: 05/19/22 11:04 PM  Result Value Ref Range   HCV Ab Non Reactive Non Reactive  Varicella zoster antibody, IgG   Collection Time: 05/19/22 11:04 PM  Result Value Ref Range   Varicella IgG <135 (L) Immune >165 index  Comprehensive metabolic panel   Collection Time: 05/19/22 11:04 PM  Result Value Ref Range   Sodium 134 (L) 135 - 145 mmol/L   Potassium 3.4 (L) 3.5 - 5.1 mmol/L   Chloride 101 98 - 111 mmol/L   CO2 23 22 - 32 mmol/L   Glucose, Bld 94 70 - 99 mg/dL   BUN 13 6 - 20 mg/dL   Creatinine, Ser 2.99 0.44 - 1.00 mg/dL   Calcium 8.9 8.9 - 24.2 mg/dL   Total Protein 7.3 6.5 - 8.1 g/dL   Albumin 3.1 (L) 3.5 - 5.0 g/dL   AST 22 15 - 41 U/L   ALT 21 0 - 44 U/L   Alkaline Phosphatase 115 38 - 126 U/L   Total Bilirubin 0.8 0.3 - 1.2 mg/dL   GFR, Estimated >68 >34 mL/min   Anion gap 10 5 - 15  Interpretation:   Collection Time: 05/19/22 11:04 PM  Result Value Ref Range   HCV Interp 1: Comment   Glucose tolerance, 1 hour   Collection Time: 05/21/22  9:12 AM  Result Value Ref Range   Glucose, 1 Hour GTT 138 mg/dL  Glucose, capillary   Collection Time: 05/21/22 12:20 PM  Result Value Ref Range   Glucose-Capillary 100 (H) 70 - 99 mg/dL   Comment 1 Document in Chart     Treatments: IV hydration and antibiotics: azithromycin and metronidazole  Hospital Course:  This is a  27  y.o. [id number] with IUP at [redacted]w[redacted]d admitted for vaginal bleeding, pelvic pain. She has had no prenatal care and had one prior admission during this pregnancy at 14wks for suspected PID. She was treated for suspected PID with IV Azythromycin and Metronidazole ad transitioned to PO Flagyl 500mg  BID at discharge. MFM consult performed, and severe IUGR diagnosed, dopplers performed WNL, Followup NST and dopplers were scheduled with MFM; New OB appt with NST was scheduled in 1 week with [facility] on 05/26/22.  She was deemed stable for discharge to home with outpatient follow up.  Discharge Physical Exam:  BP (!) 105/48   Pulse 72   Temp 98.6 F (37 C) (Oral)   Resp 18   Ht 5\' 6"  (1.676 m)   Wt 80 kg   LMP  (LMP Unknown)   SpO2 98%   BMI 28.47 kg/m   General: NAD CV: RRR Pulm: CTABL, nl effort ABD: s/nd/nt, gravid DVT Evaluation: LE non-ttp, no evidence of DVT on exam.  SVE: FHT TOCO:   Discharge Condition: Stable  Disposition: Discharge disposition: 01-Home or Self Care        Allergies as of 05/21/2022   No Known Allergies      Medication List     TAKE these medications    acetaminophen 500 MG tablet Commonly known as: TYLENOL Take 2 tablets (1,000 mg total) by mouth every 6 (six) hours as needed for mild pain (temp >100.4 F).   calcium carbonate 500 MG chewable tablet Commonly known as: TUMS - dosed in mg elemental calcium Chew 2 tablets (400 mg of elemental calcium total) by mouth every 4 (four) hours as needed for indigestion.   metroNIDAZOLE 500 MG tablet Commonly known as: Flagyl Take 1 tablet (500 mg total) by mouth 2 (two) times daily for 7 days.   nicotine 21 mg/24hr patch Commonly known as: NICODERM CQ - dosed in mg/24 hours Place 1 patch (21 mg total) onto the skin daily at 6 (six) AM.   ondansetron 4 MG tablet Commonly known as: ZOFRAN Take 1 tablet (4 mg total) by mouth every 8 (eight) hours as needed for nausea or vomiting. What changed:   when to take this reasons to take this        [facility] Follow up on 05/26/2022.   Why: arrive at [facility] to check in for your appointment for baby monitoring at 2.15pm Contact information: [place]. [place] [phone]        Alfons Sulkowski, Murray Hodgkins, CNM Follow up on 05/26/2022.   Specialty: Obstetrics and Gynecology Why: prenatal care appointment at [facility] information: [place] [id number] [phone]                 Signed: ----- Francetta Found, CNM 05/21/2022  1:50 PM

## 2022-05-21 NOTE — Progress Notes (Signed)
Attempted to wake pt x 2 for 1 hr gtt this am. Pt groaned multiple times and when I asked pt if she was going to drink the solution for the test she stated "no I ain't doing it". Will try again when pt wakes and is agreeable to test.

## 2022-05-21 NOTE — Progress Notes (Signed)
Patient plans to discharge at her sister's home:  [place] [address]   Patient cell # (only works on [employer] per pt) [phone]

## 2022-05-21 NOTE — Progress Notes (Signed)
Maternal Fetal Medicine (Telephone Call)  Patient was sleepy when I visited her yesterday after ultrasound. To ensure that she understood my counseling on fetal growth restriction, I called the patient at mother-baby unit and counseled her again on ultrasound findings.    Early pregnancy dating is accurate, and her EDD should be 08/18/2022.    Based on that EDD, she has severe fetal growth restriction.  I explained the possible causes including placental insufficiency (most common cause).    Early onset fetal growth restriction can be associated with fetal chromosomal anomalies.  I discussed cell-free fetal DNA screening.  I discussed ultrasound protocol of monitoring fetal growth restriction.

## 2022-05-21 NOTE — Progress Notes (Signed)
Completed drink for 1 hour gtt. Lab ordered and notified for draw at [facility].

## 2022-05-23 LAB — HEMOGLOBIN A1C
Hgb A1c MFr Bld: 5.3 % (ref 4.8–5.6)
Mean Plasma Glucose: 105 mg/dL

## 2022-05-29 ENCOUNTER — Other Ambulatory Visit: Payer: Self-pay

## 2022-05-29 DIAGNOSIS — O99213 Obesity complicating pregnancy, third trimester: Secondary | ICD-10-CM

## 2022-05-29 DIAGNOSIS — F1910 Other psychoactive substance abuse, uncomplicated: Secondary | ICD-10-CM

## 2022-05-29 DIAGNOSIS — F1210 Cannabis abuse, uncomplicated: Secondary | ICD-10-CM

## 2022-05-29 DIAGNOSIS — O0932 Supervision of pregnancy with insufficient antenatal care, second trimester: Secondary | ICD-10-CM

## 2022-05-29 DIAGNOSIS — O99322 Drug use complicating pregnancy, second trimester: Secondary | ICD-10-CM

## 2022-05-29 DIAGNOSIS — O99212 Obesity complicating pregnancy, second trimester: Secondary | ICD-10-CM

## 2022-05-29 DIAGNOSIS — O0990 Supervision of high risk pregnancy, unspecified, unspecified trimester: Secondary | ICD-10-CM

## 2022-05-29 DIAGNOSIS — Z8759 Personal history of other complications of pregnancy, childbirth and the puerperium: Secondary | ICD-10-CM

## 2022-05-29 LAB — MATERNIT21  PLUS CORE+ESS+SCA, BLOOD

## 2022-06-03 ENCOUNTER — Other Ambulatory Visit: Payer: Self-pay

## 2022-06-03 ENCOUNTER — Ambulatory Visit: Payer: Medicaid Other

## 2022-06-03 ENCOUNTER — Ambulatory Visit: Payer: Medicaid Other | Attending: Maternal & Fetal Medicine

## 2022-06-03 VITALS — BP 131/80 | HR 110 | Temp 98.6°F | Ht 65.0 in | Wt 193.0 lb

## 2022-06-03 DIAGNOSIS — F1290 Cannabis use, unspecified, uncomplicated: Secondary | ICD-10-CM

## 2022-06-03 DIAGNOSIS — Z3A29 29 weeks gestation of pregnancy: Secondary | ICD-10-CM

## 2022-06-03 DIAGNOSIS — F1910 Other psychoactive substance abuse, uncomplicated: Secondary | ICD-10-CM | POA: Diagnosis not present

## 2022-06-03 DIAGNOSIS — O09293 Supervision of pregnancy with other poor reproductive or obstetric history, third trimester: Secondary | ICD-10-CM

## 2022-06-03 DIAGNOSIS — O09212 Supervision of pregnancy with history of pre-term labor, second trimester: Secondary | ICD-10-CM

## 2022-06-03 DIAGNOSIS — O0990 Supervision of high risk pregnancy, unspecified, unspecified trimester: Secondary | ICD-10-CM

## 2022-06-03 DIAGNOSIS — O99323 Drug use complicating pregnancy, third trimester: Secondary | ICD-10-CM | POA: Diagnosis not present

## 2022-06-03 DIAGNOSIS — O0993 Supervision of high risk pregnancy, unspecified, third trimester: Secondary | ICD-10-CM | POA: Diagnosis present

## 2022-06-03 DIAGNOSIS — O99213 Obesity complicating pregnancy, third trimester: Secondary | ICD-10-CM

## 2022-06-03 DIAGNOSIS — Z8759 Personal history of other complications of pregnancy, childbirth and the puerperium: Secondary | ICD-10-CM

## 2022-06-03 DIAGNOSIS — O09213 Supervision of pregnancy with history of pre-term labor, third trimester: Secondary | ICD-10-CM

## 2022-06-03 DIAGNOSIS — O99212 Obesity complicating pregnancy, second trimester: Secondary | ICD-10-CM

## 2022-06-03 DIAGNOSIS — O0933 Supervision of pregnancy with insufficient antenatal care, third trimester: Secondary | ICD-10-CM | POA: Diagnosis not present

## 2022-06-03 DIAGNOSIS — Z363 Encounter for antenatal screening for malformations: Secondary | ICD-10-CM

## 2022-06-03 DIAGNOSIS — F1210 Cannabis abuse, uncomplicated: Secondary | ICD-10-CM

## 2022-06-03 DIAGNOSIS — O365930 Maternal care for other known or suspected poor fetal growth, third trimester, not applicable or unspecified: Secondary | ICD-10-CM

## 2022-06-03 DIAGNOSIS — O99332 Smoking (tobacco) complicating pregnancy, second trimester: Secondary | ICD-10-CM

## 2022-06-03 DIAGNOSIS — E669 Obesity, unspecified: Secondary | ICD-10-CM

## 2022-06-03 DIAGNOSIS — O26892 Other specified pregnancy related conditions, second trimester: Secondary | ICD-10-CM

## 2022-06-03 DIAGNOSIS — O0932 Supervision of pregnancy with insufficient antenatal care, second trimester: Secondary | ICD-10-CM

## 2022-06-03 DIAGNOSIS — O99322 Drug use complicating pregnancy, second trimester: Secondary | ICD-10-CM

## 2022-06-05 ENCOUNTER — Other Ambulatory Visit: Payer: Self-pay

## 2022-06-05 DIAGNOSIS — O0990 Supervision of high risk pregnancy, unspecified, unspecified trimester: Secondary | ICD-10-CM

## 2022-06-05 DIAGNOSIS — O0932 Supervision of pregnancy with insufficient antenatal care, second trimester: Secondary | ICD-10-CM

## 2022-06-05 DIAGNOSIS — O99322 Drug use complicating pregnancy, second trimester: Secondary | ICD-10-CM

## 2022-06-05 DIAGNOSIS — O99212 Obesity complicating pregnancy, second trimester: Secondary | ICD-10-CM

## 2022-06-05 DIAGNOSIS — Z8759 Personal history of other complications of pregnancy, childbirth and the puerperium: Secondary | ICD-10-CM

## 2022-06-05 DIAGNOSIS — O99213 Obesity complicating pregnancy, third trimester: Secondary | ICD-10-CM

## 2022-06-05 DIAGNOSIS — O0933 Supervision of pregnancy with insufficient antenatal care, third trimester: Secondary | ICD-10-CM

## 2022-06-05 DIAGNOSIS — F1210 Cannabis abuse, uncomplicated: Secondary | ICD-10-CM

## 2022-06-05 DIAGNOSIS — Z8482 Family history of sudden infant death syndrome: Secondary | ICD-10-CM

## 2022-06-08 ENCOUNTER — Encounter: Payer: Self-pay | Admitting: Obstetrics and Gynecology

## 2022-06-08 ENCOUNTER — Observation Stay
Admission: EM | Admit: 2022-06-08 | Discharge: 2022-06-09 | Disposition: A | Discharge status: Home / Self Care | Payer: Medicaid Other | Attending: Obstetrics and Gynecology | Admitting: Obstetrics and Gynecology

## 2022-06-08 ENCOUNTER — Other Ambulatory Visit: Payer: Self-pay

## 2022-06-08 DIAGNOSIS — O0932 Supervision of pregnancy with insufficient antenatal care, second trimester: Secondary | ICD-10-CM

## 2022-06-08 DIAGNOSIS — F1290 Cannabis use, unspecified, uncomplicated: Secondary | ICD-10-CM

## 2022-06-08 DIAGNOSIS — O99212 Obesity complicating pregnancy, second trimester: Secondary | ICD-10-CM

## 2022-06-08 DIAGNOSIS — Z3A29 29 weeks gestation of pregnancy: Secondary | ICD-10-CM | POA: Diagnosis not present

## 2022-06-08 DIAGNOSIS — O26892 Other specified pregnancy related conditions, second trimester: Secondary | ICD-10-CM

## 2022-06-08 DIAGNOSIS — R109 Unspecified abdominal pain: Secondary | ICD-10-CM

## 2022-06-08 DIAGNOSIS — N898 Other specified noninflammatory disorders of vagina: Secondary | ICD-10-CM | POA: Diagnosis present

## 2022-06-08 DIAGNOSIS — F17210 Nicotine dependence, cigarettes, uncomplicated: Secondary | ICD-10-CM | POA: Insufficient documentation

## 2022-06-08 DIAGNOSIS — O99320 Drug use complicating pregnancy, unspecified trimester: Secondary | ICD-10-CM

## 2022-06-08 DIAGNOSIS — O3463 Maternal care for abnormality of vagina, third trimester: Principal | ICD-10-CM | POA: Insufficient documentation

## 2022-06-08 DIAGNOSIS — O99332 Smoking (tobacco) complicating pregnancy, second trimester: Secondary | ICD-10-CM

## 2022-06-08 DIAGNOSIS — O99333 Smoking (tobacco) complicating pregnancy, third trimester: Secondary | ICD-10-CM | POA: Insufficient documentation

## 2022-06-08 DIAGNOSIS — O99322 Drug use complicating pregnancy, second trimester: Secondary | ICD-10-CM

## 2022-06-08 DIAGNOSIS — O09212 Supervision of pregnancy with history of pre-term labor, second trimester: Secondary | ICD-10-CM

## 2022-06-08 DIAGNOSIS — O26893 Other specified pregnancy related conditions, third trimester: Secondary | ICD-10-CM | POA: Diagnosis present

## 2022-06-08 LAB — WET PREP, GENITAL
Clue Cells Wet Prep HPF POC: NONE SEEN
Sperm: NONE SEEN
Trich, Wet Prep: NONE SEEN
WBC, Wet Prep HPF POC: <10 — AB (ref <10)
Yeast Wet Prep HPF POC: NONE SEEN

## 2022-06-08 LAB — URINE DRUG SCREEN, QUALITATIVE (ARMC ONLY)
Amphetamines, Ur Screen: POSITIVE — AB
Barbiturates, Ur Screen: NOT DETECTED
Benzodiazepine, Ur Scrn: NOT DETECTED
Cannabinoid 50 Ng, Ur ~~LOC~~: POSITIVE — AB
Cocaine Metabolite,Ur ~~LOC~~: POSITIVE — AB
MDMA (Ecstasy)Ur Screen: NOT DETECTED
Methadone Scn, Ur: NOT DETECTED
Opiate, Ur Screen: NOT DETECTED
Phencyclidine (PCP) Ur S: NOT DETECTED
Tricyclic, Ur Screen: NOT DETECTED

## 2022-06-08 LAB — URINALYSIS, COMPLETE (UACMP) WITH MICROSCOPIC
Bilirubin Urine: NEGATIVE
Glucose, UA: NEGATIVE mg/dL
Hgb urine dipstick: NEGATIVE
Ketones, ur: 5 mg/dL — AB
Leukocytes,Ua: NEGATIVE
Nitrite: NEGATIVE
Protein, ur: NEGATIVE mg/dL
Specific Gravity, Urine: 1.015 (ref 1.005–1.030)
pH: 6 (ref 5.0–8.0)

## 2022-06-08 LAB — RUPTURE OF MEMBRANE (ROM)PLUS: Rom Plus: NEGATIVE

## 2022-06-08 NOTE — OB Triage Note (Signed)
Susan [name] 27 y.o. presents to Labor & Delivery triage via wheelchair steered by ED staff reporting vaginal pain and leaking of fluid. She is a F7P1025 at [redacted]w[redacted]d . She denies signs and symptoms consistent with active vaginal bleeding. She denies contractions and states positive fetal movement. External FM and TOCO applied to non-tender abdomen. Initial FHR 150. Vital signs obtained and within normal limits. Patient oriented to care environment including call bell and bed control use. [name], CNM notified of patient's arrival.

## 2022-06-08 NOTE — Discharge Summary (Addendum)
Susan [name] is a 27 y.o. female. She is at [redacted]w[redacted]d gestation. No LMP recorded (lmp unknown). Patient is pregnant. Estimated Date of Delivery: 08/18/22  Prenatal care site: none  Current pregnancy complicated by:  No prenatal care other than visits to [facility] and with MFM; prenatal labs done during her hospital admissions.  Severe IUGR, last growth 06/03/22 with EFW 13%ile at MFM, normal dopplers.  Substance abuse: tobacco, MJ and amphetamines.  Obesity, BMI 32 Hx Pre-Eclampsia with severe features in previous pregnancy Prior LTCS with 2 successful VBAC Hx 33wk preterm birth with last delivery 04/2021 2 hospital admissions for suspected PID in pregnancy- 77 and 25wks Unstable housing situation, last admission was DC home to live with her sister.    Chief complaint: vaginal pain and vaginal discharge with odor, also reports pain and odor with urination.    S: Resting comfortably. no CTX, no VB.no LOF,  Active fetal movement. Denies: HA, visual changes, SOB, or RUQ/epigastric pain  Maternal Medical History:   Past Medical History:  Diagnosis Date   ADHD (attention deficit hyperactivity disorder)    Depression    Preeclampsia, severe, third trimester    HELLP Syndrome    Past Surgical History:  Procedure Laterality Date   NO PAST SURGERIES      No Known Allergies  Prior to Admission medications   Medication Sig Start Date End Date Taking? Authorizing Provider  acetaminophen (TYLENOL) 500 MG tablet Take 2 tablets (1,000 mg total) by mouth every 6 (six) hours as needed for mild pain (temp >100.4 F). Patient not taking: Reported on 05/20/2022 02/23/22   Caedon Bond, Murray Hodgkins, CNM  calcium carbonate (TUMS - DOSED IN MG ELEMENTAL CALCIUM) 500 MG chewable tablet Chew 2 tablets (400 mg of elemental calcium total) by mouth every 4 (four) hours as needed for indigestion. 05/21/22   Takirah Binford, Murray Hodgkins, CNM  nicotine (NICODERM CQ - DOSED IN MG/24 HOURS) 21 mg/24hr patch Place 1 patch (21 mg total)  onto the skin daily at 6 (six) AM. Patient not taking: Reported on 06/03/2022 05/21/22   Dimitria Ketchum, Murray Hodgkins, CNM  ondansetron (ZOFRAN) 4 MG tablet Take 1 tablet (4 mg total) by mouth every 8 (eight) hours as needed for nausea or vomiting. 05/21/22   Isella Slatten, Murray Hodgkins, CNM      Social History: She  reports that she has been smoking cigarettes. She has been smoking an average of 2 packs per day. She has never used smokeless tobacco. She reports that she does not currently use alcohol. She reports current drug use. Frequency: 1.00 time per week. Drugs: Marijuana and Methamphetamines.  Family History: family history includes Diabetes in her father, mother, and paternal aunt; SIDS in her son.   Review of Systems: A full review of systems was performed and negative except as noted in the HPI.     O:  BP 116/69 (BP Location: Left Arm)   Pulse (!) 102   Temp 98.6 F (37 C) (Oral)   LMP  (LMP Unknown)   Results for orders placed or performed during the hospital encounter of 06/08/22 (from the past 48 hour(s))  Wet prep, genital   Collection Time: 06/08/22 11:00 PM  Result Value Ref Range   Yeast Wet Prep HPF POC NONE SEEN NONE SEEN   Trich, Wet Prep NONE SEEN NONE SEEN   Clue Cells Wet Prep HPF POC NONE SEEN NONE SEEN   WBC, Wet Prep HPF POC <10 (A) <10   Sperm NONE SEEN  ROM Plus ([facility] only)   Collection Time: 06/08/22 11:00 PM  Result Value Ref Range   Rom Plus NEGATIVE   Urinalysis, Complete w Microscopic -Urine, Clean Catch   Collection Time: 06/08/22 11:30 PM  Result Value Ref Range   Color, Urine AMBER (A) YELLOW   APPearance HAZY (A) CLEAR   Specific Gravity, Urine 1.015 1.005 - 1.030   pH 6.0 5.0 - 8.0   Glucose, UA NEGATIVE NEGATIVE mg/dL   Hgb urine dipstick NEGATIVE NEGATIVE   Bilirubin Urine NEGATIVE NEGATIVE   Ketones, ur 5 (A) NEGATIVE mg/dL   Protein, ur NEGATIVE NEGATIVE mg/dL   Nitrite NEGATIVE NEGATIVE   Leukocytes,Ua NEGATIVE NEGATIVE   RBC / HPF 0-5 0 - 5 RBC/hpf    WBC, UA 0-5 0 - 5 WBC/hpf   Bacteria, UA RARE (A) NONE SEEN   Squamous Epithelial / HPF 0-5 0 - 5 /HPF   Mucus PRESENT      Constitutional: NAD, AAOx3  HE/ENT: extraocular movements grossly intact, moist mucous membranes CV: RRR PULM: nl respiratory effort, CTABL     Abd: gravid, non-tender, non-distended, soft      Ext: Non-tender, Nonedematous   Psych: mood appropriate, speech normal Pelvic: genital swabs performed by nursing staff.   Fetal  monitoring: Cat I Appropriate for GA Baseline: 150bpm Variability: moderate Accelerations:present x >2 Decelerations absent  TOCO: NO UCs noted.   Bedside US performed- SIUP in cephalic presentation, subjectively normal fluid noted.   A/P: 27 y.o. [redacted]w[redacted]d here for antenatal surveillance for vaginal discharge  Principle Diagnosis:  vaginal discharge, 29wks  Preterm labor: not present.  Fetal Wellbeing: Reassuring Cat 1 tracing with Reactive NST  Neg ROM plus, UA and wet prep Pending UDS and GC/CT D/c home stable, precautions reviewed, follow-up as scheduled.    Francetta Found, CNM 06/08/2022  11:57 PM

## 2022-06-09 DIAGNOSIS — O3463 Maternal care for abnormality of vagina, third trimester: Secondary | ICD-10-CM | POA: Diagnosis not present

## 2022-06-09 LAB — CHLAMYDIA/NGC RT PCR (ARMC ONLY)
Chlamydia Tr: NOT DETECTED
N gonorrhoeae: NOT DETECTED

## 2022-06-09 MED ORDER — DIPHENHYDRAMINE HCL 25 MG PO CAPS
ORAL_CAPSULE | ORAL | Status: AC
  Administered 2022-06-09: 25 mg via ORAL
  Filled 2022-06-09: qty 1

## 2022-06-09 MED ORDER — ACETAMINOPHEN 325 MG PO TABS
ORAL_TABLET | ORAL | Status: AC
  Administered 2022-06-09: 650 mg via ORAL
  Filled 2022-06-09: qty 2

## 2022-06-09 MED ORDER — ACETAMINOPHEN 325 MG PO TABS: 650.0000 mg | ORAL_TABLET | Freq: Once | ORAL | Status: AC

## 2022-06-09 MED ORDER — DIPHENHYDRAMINE HCL 25 MG PO CAPS: 25.0000 mg | ORAL_CAPSULE | Freq: Once | ORAL | Status: AC

## 2022-06-09 NOTE — Progress Notes (Signed)
After being given discharge papers by nursing staff, pt refused to be escorted downstairs for discharge home. CNM to room, pt demanded medication for sleep and pain. CNM reiterated that only OTC meds can be used. Rx tylenol and benadryl now, UDS noted to be positive for Cocaine, amphetamines and marijuana. Discussed risks to her fetus with severe IUGR, increased risk of preterm birth, abruption and even fetal death.   Francetta Found, CNM 07/01/2022 12:20 AM

## 2022-06-10 ENCOUNTER — Ambulatory Visit: Payer: Medicaid Other

## 2022-06-10 NOTE — Progress Notes (Deleted)
Pt was a "No Show" for her MFM appt today 06/10/22

## 2022-06-26 ENCOUNTER — Other Ambulatory Visit: Payer: Self-pay | Admitting: Maternal & Fetal Medicine

## 2022-06-26 ENCOUNTER — Ambulatory Visit: Payer: Medicaid Other | Attending: Obstetrics

## 2022-06-26 ENCOUNTER — Other Ambulatory Visit: Payer: Self-pay

## 2022-06-26 VITALS — BP 135/63 | HR 128 | Temp 97.4°F | Ht 66.0 in | Wt 195.0 lb

## 2022-06-26 DIAGNOSIS — O0932 Supervision of pregnancy with insufficient antenatal care, second trimester: Secondary | ICD-10-CM | POA: Diagnosis not present

## 2022-06-26 DIAGNOSIS — Z8482 Family history of sudden infant death syndrome: Secondary | ICD-10-CM | POA: Insufficient documentation

## 2022-06-26 DIAGNOSIS — F1910 Other psychoactive substance abuse, uncomplicated: Secondary | ICD-10-CM | POA: Diagnosis not present

## 2022-06-26 DIAGNOSIS — F1210 Cannabis abuse, uncomplicated: Secondary | ICD-10-CM | POA: Insufficient documentation

## 2022-06-26 DIAGNOSIS — N739 Female pelvic inflammatory disease, unspecified: Secondary | ICD-10-CM | POA: Diagnosis not present

## 2022-06-26 DIAGNOSIS — O99213 Obesity complicating pregnancy, third trimester: Secondary | ICD-10-CM

## 2022-06-26 DIAGNOSIS — O99212 Obesity complicating pregnancy, second trimester: Secondary | ICD-10-CM

## 2022-06-26 DIAGNOSIS — Z362 Encounter for other antenatal screening follow-up: Secondary | ICD-10-CM | POA: Insufficient documentation

## 2022-06-26 DIAGNOSIS — O26892 Other specified pregnancy related conditions, second trimester: Secondary | ICD-10-CM

## 2022-06-26 DIAGNOSIS — O1493 Unspecified pre-eclampsia, third trimester: Secondary | ICD-10-CM | POA: Insufficient documentation

## 2022-06-26 DIAGNOSIS — F1290 Cannabis use, unspecified, uncomplicated: Secondary | ICD-10-CM

## 2022-06-26 DIAGNOSIS — O23593 Infection of other part of genital tract in pregnancy, third trimester: Secondary | ICD-10-CM | POA: Diagnosis not present

## 2022-06-26 DIAGNOSIS — Z3A32 32 weeks gestation of pregnancy: Secondary | ICD-10-CM | POA: Insufficient documentation

## 2022-06-26 DIAGNOSIS — O0990 Supervision of high risk pregnancy, unspecified, unspecified trimester: Secondary | ICD-10-CM

## 2022-06-26 DIAGNOSIS — Z8759 Personal history of other complications of pregnancy, childbirth and the puerperium: Secondary | ICD-10-CM

## 2022-06-26 DIAGNOSIS — O365930 Maternal care for other known or suspected poor fetal growth, third trimester, not applicable or unspecified: Secondary | ICD-10-CM | POA: Diagnosis not present

## 2022-06-26 DIAGNOSIS — O09293 Supervision of pregnancy with other poor reproductive or obstetric history, third trimester: Secondary | ICD-10-CM | POA: Diagnosis not present

## 2022-06-26 DIAGNOSIS — O99323 Drug use complicating pregnancy, third trimester: Secondary | ICD-10-CM | POA: Insufficient documentation

## 2022-06-26 DIAGNOSIS — O0933 Supervision of pregnancy with insufficient antenatal care, third trimester: Secondary | ICD-10-CM | POA: Diagnosis not present

## 2022-06-26 DIAGNOSIS — O09213 Supervision of pregnancy with history of pre-term labor, third trimester: Secondary | ICD-10-CM | POA: Diagnosis not present

## 2022-06-26 DIAGNOSIS — O09212 Supervision of pregnancy with history of pre-term labor, second trimester: Secondary | ICD-10-CM

## 2022-06-26 DIAGNOSIS — O99332 Smoking (tobacco) complicating pregnancy, second trimester: Secondary | ICD-10-CM

## 2022-06-26 DIAGNOSIS — O99322 Drug use complicating pregnancy, second trimester: Secondary | ICD-10-CM

## 2022-06-26 DIAGNOSIS — E669 Obesity, unspecified: Secondary | ICD-10-CM

## 2022-07-01 ENCOUNTER — Other Ambulatory Visit: Payer: Self-pay

## 2022-07-01 DIAGNOSIS — Z8759 Personal history of other complications of pregnancy, childbirth and the puerperium: Secondary | ICD-10-CM

## 2022-07-01 DIAGNOSIS — O0990 Supervision of high risk pregnancy, unspecified, unspecified trimester: Secondary | ICD-10-CM

## 2022-07-01 DIAGNOSIS — O99332 Smoking (tobacco) complicating pregnancy, second trimester: Secondary | ICD-10-CM

## 2022-07-01 DIAGNOSIS — O99333 Smoking (tobacco) complicating pregnancy, third trimester: Secondary | ICD-10-CM

## 2022-07-01 DIAGNOSIS — O0933 Supervision of pregnancy with insufficient antenatal care, third trimester: Secondary | ICD-10-CM

## 2022-07-01 DIAGNOSIS — O99213 Obesity complicating pregnancy, third trimester: Secondary | ICD-10-CM

## 2022-07-01 DIAGNOSIS — O99323 Drug use complicating pregnancy, third trimester: Secondary | ICD-10-CM

## 2022-07-03 ENCOUNTER — Ambulatory Visit: Payer: Medicaid Other

## 2022-07-03 NOTE — Progress Notes (Deleted)
Patient scheduled for a MFM, patient did not show, office called with no answer.

## 2022-07-04 ENCOUNTER — Other Ambulatory Visit: Payer: Self-pay

## 2022-07-04 DIAGNOSIS — O0933 Supervision of pregnancy with insufficient antenatal care, third trimester: Secondary | ICD-10-CM

## 2022-07-04 DIAGNOSIS — N739 Female pelvic inflammatory disease, unspecified: Secondary | ICD-10-CM

## 2022-07-04 DIAGNOSIS — Z8759 Personal history of other complications of pregnancy, childbirth and the puerperium: Secondary | ICD-10-CM

## 2022-07-04 DIAGNOSIS — O99213 Obesity complicating pregnancy, third trimester: Secondary | ICD-10-CM

## 2022-07-04 DIAGNOSIS — F1910 Other psychoactive substance abuse, uncomplicated: Secondary | ICD-10-CM

## 2022-07-04 DIAGNOSIS — O0990 Supervision of high risk pregnancy, unspecified, unspecified trimester: Secondary | ICD-10-CM

## 2022-07-04 DIAGNOSIS — O99332 Smoking (tobacco) complicating pregnancy, second trimester: Secondary | ICD-10-CM

## 2022-07-08 ENCOUNTER — Other Ambulatory Visit: Payer: Medicaid Other

## 2022-07-15 ENCOUNTER — Ambulatory Visit: Payer: Medicaid Other

## 2022-07-15 NOTE — Progress Notes (Deleted)
Pt was a "No Show" for her MFM appt today.

## 2022-07-23 ENCOUNTER — Other Ambulatory Visit: Payer: Medicaid Other

## 2022-07-24 ENCOUNTER — Other Ambulatory Visit: Payer: Medicaid Other

## 2022-08-10 ENCOUNTER — Other Ambulatory Visit: Payer: Self-pay

## 2022-08-10 ENCOUNTER — Inpatient Hospital Stay
Admission: EM | Admit: 2022-08-10 | Discharge: 2022-08-11 | DRG: 806 | Disposition: A | Discharge status: Other Acute Inpatient Hospital | Payer: Medicaid Other | Attending: Certified Nurse Midwife | Admitting: Certified Nurse Midwife

## 2022-08-10 ENCOUNTER — Encounter: Payer: Self-pay | Admitting: Obstetrics and Gynecology

## 2022-08-10 DIAGNOSIS — Z046 Encounter for general psychiatric examination, requested by authority: Secondary | ICD-10-CM

## 2022-08-10 DIAGNOSIS — F4325 Adjustment disorder with mixed disturbance of emotions and conduct: Principal | ICD-10-CM | POA: Insufficient documentation

## 2022-08-10 DIAGNOSIS — F1910 Other psychoactive substance abuse, uncomplicated: Secondary | ICD-10-CM

## 2022-08-10 DIAGNOSIS — Z20822 Contact with and (suspected) exposure to covid-19: Secondary | ICD-10-CM | POA: Diagnosis present

## 2022-08-10 DIAGNOSIS — O99334 Smoking (tobacco) complicating childbirth: Secondary | ICD-10-CM | POA: Diagnosis present

## 2022-08-10 DIAGNOSIS — F1410 Cocaine abuse, uncomplicated: Secondary | ICD-10-CM | POA: Diagnosis present

## 2022-08-10 DIAGNOSIS — F17210 Nicotine dependence, cigarettes, uncomplicated: Secondary | ICD-10-CM | POA: Diagnosis present

## 2022-08-10 DIAGNOSIS — Z3A38 38 weeks gestation of pregnancy: Secondary | ICD-10-CM

## 2022-08-10 DIAGNOSIS — O365930 Maternal care for other known or suspected poor fetal growth, third trimester, not applicable or unspecified: Secondary | ICD-10-CM | POA: Diagnosis present

## 2022-08-10 DIAGNOSIS — O1414 Severe pre-eclampsia complicating childbirth: Secondary | ICD-10-CM | POA: Diagnosis present

## 2022-08-10 DIAGNOSIS — F1210 Cannabis abuse, uncomplicated: Secondary | ICD-10-CM | POA: Diagnosis present

## 2022-08-10 DIAGNOSIS — O99324 Drug use complicating childbirth: Secondary | ICD-10-CM | POA: Diagnosis present

## 2022-08-10 DIAGNOSIS — O99214 Obesity complicating childbirth: Secondary | ICD-10-CM | POA: Diagnosis present

## 2022-08-10 DIAGNOSIS — O99344 Other mental disorders complicating childbirth: Secondary | ICD-10-CM | POA: Diagnosis present

## 2022-08-10 DIAGNOSIS — O34211 Maternal care for low transverse scar from previous cesarean delivery: Secondary | ICD-10-CM | POA: Diagnosis present

## 2022-08-10 LAB — COMPREHENSIVE METABOLIC PANEL
ALT: 120 U/L — ABNORMAL HIGH (ref 0–44)
AST: 121 U/L — ABNORMAL HIGH (ref 15–41)
Albumin: 2.5 g/dL — ABNORMAL LOW (ref 3.5–5.0)
Alkaline Phosphatase: 378 U/L — ABNORMAL HIGH (ref 38–126)
Anion gap: 8 (ref 5–15)
BUN: 13 mg/dL (ref 6–20)
CO2: 18 mmol/L — ABNORMAL LOW (ref 22–32)
Calcium: 8.2 mg/dL — ABNORMAL LOW (ref 8.9–10.3)
Chloride: 107 mmol/L (ref 98–111)
Creatinine, Ser: 0.57 mg/dL (ref 0.44–1.00)
GFR, Estimated: >60 mL/min (ref >60)
Glucose, Bld: 148 mg/dL — ABNORMAL HIGH (ref 70–99)
Potassium: 3.5 mmol/L (ref 3.5–5.1)
Sodium: 133 mmol/L — ABNORMAL LOW (ref 135–145)
Total Bilirubin: 0.8 mg/dL (ref 0.3–1.2)
Total Protein: 6.6 g/dL (ref 6.5–8.1)

## 2022-08-10 LAB — PROTEIN / CREATININE RATIO, URINE
Creatinine, Urine: 106 mg/dL
Protein Creatinine Ratio: 0.46 mg/mg{Cre} — ABNORMAL HIGH (ref 0.00–0.15)
Total Protein, Urine: 49 mg/dL

## 2022-08-10 LAB — TYPE AND SCREEN
ABO/RH(D): O POS
Antibody Screen: NEGATIVE

## 2022-08-10 LAB — URINE DRUG SCREEN, QUALITATIVE (ARMC ONLY)
Amphetamines, Ur Screen: POSITIVE — AB
Barbiturates, Ur Screen: NOT DETECTED
Benzodiazepine, Ur Scrn: NOT DETECTED
Cannabinoid 50 Ng, Ur ~~LOC~~: POSITIVE — AB
Cocaine Metabolite,Ur ~~LOC~~: POSITIVE — AB
MDMA (Ecstasy)Ur Screen: NOT DETECTED
Methadone Scn, Ur: NOT DETECTED
Opiate, Ur Screen: NOT DETECTED
Phencyclidine (PCP) Ur S: NOT DETECTED
Tricyclic, Ur Screen: NOT DETECTED

## 2022-08-10 LAB — CBC
HCT: 33.2 % — ABNORMAL LOW (ref 36.0–46.0)
Hemoglobin: 10.8 g/dL — ABNORMAL LOW (ref 12.0–15.0)
MCH: 28 pg (ref 26.0–34.0)
MCHC: 32.5 g/dL (ref 30.0–36.0)
MCV: 86 fL (ref 80.0–100.0)
Platelets: 250 10*3/uL (ref 150–400)
RBC: 3.86 MIL/uL — ABNORMAL LOW (ref 3.87–5.11)
RDW: 16.6 % — ABNORMAL HIGH (ref 11.5–15.5)
WBC: 14 10*3/uL — ABNORMAL HIGH (ref 4.0–10.5)
nRBC: 0 % (ref 0.0–0.2)

## 2022-08-10 MED ORDER — MEDROXYPROGESTERONE ACETATE 150 MG/ML IM SUSP: 150.0000 mg | INTRAMUSCULAR | Status: DC | PRN

## 2022-08-10 MED ORDER — DIPHENHYDRAMINE HCL 25 MG PO CAPS: 25.0000 mg | ORAL_CAPSULE | Freq: Four times a day (QID) | ORAL | Status: DC | PRN

## 2022-08-10 MED ORDER — ZOLPIDEM TARTRATE 5 MG PO TABS: 5.0000 mg | ORAL_TABLET | Freq: Every evening | ORAL | Status: DC | PRN

## 2022-08-10 MED ORDER — FLEET ENEMA 7-19 GM/118ML RE ENEM: 1.0000 | ENEMA | Freq: Every day | RECTAL | Status: DC | PRN

## 2022-08-10 MED ORDER — LABETALOL HCL 5 MG/ML IV SOLN
20.0000 mg | INTRAVENOUS | Status: DC | PRN
  Administered 2022-08-10 (×2): 20 mg via INTRAVENOUS
  Filled 2022-08-10 (×2): qty 4

## 2022-08-10 MED ORDER — PRENATAL MULTIVITAMIN CH
1.0000 | ORAL_TABLET | Freq: Every day | ORAL | Status: DC
  Administered 2022-08-10: 1 via ORAL
  Filled 2022-08-10 (×2): qty 1

## 2022-08-10 MED ORDER — WITCH HAZEL-GLYCERIN EX PADS
1.0000 | MEDICATED_PAD | CUTANEOUS | Status: DC | PRN
  Administered 2022-08-11: 1 via TOPICAL
  Filled 2022-08-10: qty 100

## 2022-08-10 MED ORDER — LABETALOL HCL 5 MG/ML IV SOLN: 80.0000 mg | INTRAVENOUS | Status: DC | PRN

## 2022-08-10 MED ORDER — MAGNESIUM SULFATE 40 GM/1000ML IV SOLN
2.0000 g/h | INTRAVENOUS | Status: DC
  Administered 2022-08-10 – 2022-08-11 (×2): 2 g/h via INTRAVENOUS
  Filled 2022-08-10 (×2): qty 1000

## 2022-08-10 MED ORDER — IBUPROFEN 600 MG PO TABS
600.0000 mg | ORAL_TABLET | Freq: Four times a day (QID) | ORAL | Status: DC
  Administered 2022-08-10 – 2022-08-11 (×3): 600 mg via ORAL
  Filled 2022-08-10 (×3): qty 1

## 2022-08-10 MED ORDER — ONDANSETRON HCL 4 MG PO TABS: 4.0000 mg | ORAL_TABLET | ORAL | Status: DC | PRN

## 2022-08-10 MED ORDER — LABETALOL HCL 5 MG/ML IV SOLN: 40.0000 mg | INTRAVENOUS | Status: DC | PRN

## 2022-08-10 MED ORDER — COCONUT OIL OIL: 1.0000 | TOPICAL_OIL | Status: DC | PRN

## 2022-08-10 MED ORDER — SENNOSIDES-DOCUSATE SODIUM 8.6-50 MG PO TABS
2.0000 | ORAL_TABLET | ORAL | Status: DC
  Administered 2022-08-10: 2 via ORAL
  Filled 2022-08-10 (×2): qty 2

## 2022-08-10 MED ORDER — SODIUM CHLORIDE 0.9% FLUSH: 3.0000 mL | Freq: Two times a day (BID) | INTRAVENOUS | Status: DC

## 2022-08-10 MED ORDER — MAGNESIUM SULFATE BOLUS VIA INFUSION
4.0000 g | Freq: Once | INTRAVENOUS | Status: AC
  Administered 2022-08-10: 4 g via INTRAVENOUS
  Filled 2022-08-10: qty 1000

## 2022-08-10 MED ORDER — BISACODYL 10 MG RE SUPP: 10.0000 mg | Freq: Every day | RECTAL | Status: DC | PRN

## 2022-08-10 MED ORDER — ACETAMINOPHEN 325 MG PO TABS
650.0000 mg | ORAL_TABLET | ORAL | Status: DC | PRN
  Administered 2022-08-11: 650 mg via ORAL
  Filled 2022-08-10: qty 2

## 2022-08-10 MED ORDER — LACTATED RINGERS IV SOLN: INTRAVENOUS | Status: DC

## 2022-08-10 MED ORDER — SIMETHICONE 80 MG PO CHEW: 80.0000 mg | CHEWABLE_TABLET | ORAL | Status: DC | PRN

## 2022-08-10 MED ORDER — BENZOCAINE-MENTHOL 20-0.5 % EX AERO
1.0000 | INHALATION_SPRAY | CUTANEOUS | Status: DC | PRN
  Administered 2022-08-11: 1 via TOPICAL
  Filled 2022-08-10: qty 56

## 2022-08-10 MED ORDER — SODIUM CHLORIDE 0.9 % IV SOLN: 250.0000 mL | INTRAVENOUS | Status: DC | PRN

## 2022-08-10 MED ORDER — ONDANSETRON HCL 4 MG/2ML IJ SOLN: 4.0000 mg | INTRAMUSCULAR | Status: DC | PRN

## 2022-08-10 MED ORDER — DIBUCAINE (PERIANAL) 1 % EX OINT: 1.0000 | TOPICAL_OINTMENT | CUTANEOUS | Status: DC | PRN

## 2022-08-10 MED ORDER — HYDRALAZINE HCL 20 MG/ML IJ SOLN: 10.0000 mg | INTRAMUSCULAR | Status: DC | PRN

## 2022-08-10 MED ORDER — NIFEDIPINE ER OSMOTIC RELEASE 30 MG PO TB24
30.0000 mg | ORAL_TABLET | Freq: Every day | ORAL | Status: DC
  Administered 2022-08-10 – 2022-08-11 (×2): 30 mg via ORAL
  Filled 2022-08-10 (×3): qty 1

## 2022-08-10 MED ORDER — VARICELLA VIRUS VACCINE LIVE 1350 PFU/0.5ML IJ SUSR: 0.5000 mL | INTRAMUSCULAR | Status: DC | PRN

## 2022-08-10 MED ORDER — SODIUM CHLORIDE 0.9% FLUSH: 3.0000 mL | INTRAVENOUS | Status: DC | PRN

## 2022-08-10 NOTE — Consult Note (Addendum)
[facility] Consult   Reason for Consult:  polysubstance use in pregnancy Referring Physician:  Avelino Leeds Patient Identification: Susan [name] MRN:  [id number] Principal Diagnosis: Normal labor and delivery Diagnosis:  Principal Problem:   Normal labor and delivery Active Problems:   Polysubstance abuse ([place])   Total Time spent with patient: 45 minutes  Subjective:   Susan [name] is a 27 y.o. female patient admitted with labor and polysubstance abuse.  HPI:  27 yo female presented for the birth of her child, consult for polysubstance abuse.  UDS with amphetamine, cocaine and cannabis.  On assessment, she awakened with some drowsiness, baby in the basinet at her bedside sleeping.  She denies depression and anxiety, no suicidal ideations or past attempts.  No psychosis.  She reports she is interested in rehab and her drug of choice is "weed", the amphetamines and cocaine "were accidental" and denies regular use.  However, her past UDS were frequently positive for both substances.  CPS is involved and she was living with her sister.  The baby was very quiet and aroused after stimulation on the second try.  Nursing staff provided a report and decision was made to move the baby to the nursing station for monitoring as the baby is very quiet, no crying, and no hunger signs.  TOC consult for substance abuse resources.  Past Psychiatric History: substance abuse  Risk to Self:  none Risk to Others:  none Prior Inpatient Therapy:  none Prior Outpatient Therapy:  none  Past Medical History: History reviewed. No pertinent past medical history. History reviewed. No pertinent surgical history. Family History: History reviewed. No pertinent family history. Family Psychiatric  History: none Social History:  Social History   Substance and Sexual Activity  Alcohol Use None     Social History   Substance and Sexual Activity  Drug Use Yes   Types: Marijuana, Cocaine,  Methamphetamines    Social History   Socioeconomic History   Marital status: Single    Spouse name: Not on file   Number of children: Not on file   Years of education: Not on file   Highest education level: Not on file  Occupational History   Not on file  Tobacco Use   Smoking status: Every Day    Packs/day: 1    Types: Cigarettes   Smokeless tobacco: Never  Substance and Sexual Activity   Alcohol use: Not on file   Drug use: Yes    Types: Marijuana, Cocaine, Methamphetamines   Sexual activity: Yes  Other Topics Concern   Not on file  Social History Narrative   Not on file   Social Determinants of Health   Financial Resource Strain: Not on file  Food Insecurity: No Food Insecurity (08/10/2022)   Hunger Vital Sign    Worried About Running Out of Food in the Last Year: Never true    Ran Out of Food in the Last Year: Never true  Transportation Needs: No Transportation Needs (08/10/2022)   PRAPARE - Hydrologist (Medical): No    Lack of Transportation (Non-Medical): No  Physical Activity: Not on file  Stress: Not on file  Social Connections: Not on file   Additional Social History:    Allergies:  Not on File  Labs:  Results for orders placed or performed during the hospital encounter of 08/10/22 (from the past 48 hour(s))  Protein / creatinine ratio, urine     Status: Abnormal   Collection Time: 08/10/22  10:35 AM  Result Value Ref Range   Creatinine, Urine 106 mg/dL   Total Protein, Urine 49 mg/dL    Comment: NO NORMAL RANGE ESTABLISHED FOR THIS TEST   Protein Creatinine Ratio 0.46 (H) 0.00 - 0.15 mg/mg[Cre]    Comment: Performed at [facility], [address] [place], [place] [address]  CBC     Status: Abnormal   Collection Time: 08/10/22 10:35 AM  Result Value Ref Range   WBC 14.0 (H) 4.0 - 10.5 K/uL   RBC 3.86 (L) 3.87 - 5.11 MIL/uL   Hemoglobin 10.8 (L) 12.0 - 15.0 g/dL   HCT 33.2 (L) 36.0 - 46.0 %   MCV 86.0 80.0 -  100.0 fL   MCH 28.0 26.0 - 34.0 pg   MCHC 32.5 30.0 - 36.0 g/dL   RDW 16.6 (H) 11.5 - 15.5 %   Platelets 250 150 - 400 K/uL   nRBC 0.0 0.0 - 0.2 %    Comment: Performed at [facility], [address] [place], [place] [address]  Comprehensive metabolic panel     Status: Abnormal   Collection Time: 08/10/22 10:35 AM  Result Value Ref Range   Sodium 133 (L) 135 - 145 mmol/L   Potassium 3.5 3.5 - 5.1 mmol/L   Chloride 107 98 - 111 mmol/L   CO2 18 (L) 22 - 32 mmol/L   Glucose, Bld 148 (H) 70 - 99 mg/dL    Comment: Glucose reference range applies only to samples taken after fasting for at least 8 hours.   BUN 13 6 - 20 mg/dL   Creatinine, Ser 0.57 0.44 - 1.00 mg/dL   Calcium 8.2 (L) 8.9 - 10.3 mg/dL   Total Protein 6.6 6.5 - 8.1 g/dL   Albumin 2.5 (L) 3.5 - 5.0 g/dL   AST 121 (H) 15 - 41 U/L   ALT 120 (H) 0 - 44 U/L   Alkaline Phosphatase 378 (H) 38 - 126 U/L   Total Bilirubin 0.8 0.3 - 1.2 mg/dL   GFR, Estimated >60 >60 mL/min    Comment: (NOTE) Calculated using the CKD-EPI Creatinine Equation (2021)    Anion gap 8 5 - 15    Comment: Performed at [facility], [address] [place], [place] [address]  Type and screen     Status: None (Preliminary result)   Collection Time: 08/10/22 10:35 AM  Result Value Ref Range   ABO/RH(D) PENDING    Antibody Screen PENDING    Sample Expiration      08/13/2022,2359 Performed at [facility], [address], [place],  [address]   Urine Drug Screen, Qualitative ([place] only)     Status: Abnormal   Collection Time: 08/10/22 10:49 AM  Result Value Ref Range   Tricyclic, Ur Screen NONE DETECTED NONE DETECTED   Amphetamines, Ur Screen POSITIVE (A) NONE DETECTED   MDMA (Ecstasy)Ur Screen NONE DETECTED NONE DETECTED   Cocaine Metabolite,Ur [place] POSITIVE (A) NONE DETECTED   Opiate, Ur Screen NONE DETECTED NONE DETECTED   Phencyclidine (PCP) Ur S NONE DETECTED NONE DETECTED   Cannabinoid 50 Ng, Ur [place] POSITIVE  (A) NONE DETECTED   Barbiturates, Ur Screen NONE DETECTED NONE DETECTED   Benzodiazepine, Ur Scrn NONE DETECTED NONE DETECTED   Methadone Scn, Ur NONE DETECTED NONE DETECTED    Comment: (NOTE) Tricyclics + metabolites, urine    Cutoff 1000 ng/mL Amphetamines + metabolites, urine  Cutoff 1000 ng/mL MDMA (Ecstasy), urine  Cutoff 500 ng/mL Cocaine Metabolite, urine          Cutoff 300 ng/mL Opiate + metabolites, urine        Cutoff 300 ng/mL Phencyclidine (PCP), urine         Cutoff 25 ng/mL Cannabinoid, urine                 Cutoff 50 ng/mL Barbiturates + metabolites, urine  Cutoff 200 ng/mL Benzodiazepine, urine              Cutoff 200 ng/mL Methadone, urine                   Cutoff 300 ng/mL  The urine drug screen provides only a preliminary, unconfirmed analytical test result and should not be used for non-medical purposes. Clinical consideration and professional judgment should be applied to any positive drug screen result due to possible interfering substances. A more specific alternate chemical method must be used in order to obtain a confirmed analytical result. Gas chromatography / mass spectrometry (GC/MS) is the preferred confirm atory method. Performed at [facility], [address] [place], [place] [address]     Current Facility-Administered Medications  Medication Dose Route Frequency Provider Last Rate Last Admin   sodium chloride flush (NS) 0.9 % injection 3 mL  3 mL Intravenous [id number] Avelino Leeds Arlyn Leak, CNM       And   sodium chloride flush (NS) 0.9 % injection 3 mL  3 mL Intravenous PRN Avelino Leeds Arlyn Leak, CNM       And   0.9 %  sodium chloride infusion  250 mL Intravenous PRN Bobette Mo, Felicia Arlyn Leak, CNM       acetaminophen (TYLENOL) tablet 650 mg  650 mg Oral Q4H PRN Avelino Leeds Arlyn Leak, CNM       benzocaine-Menthol (DERMOPLAST) 20-0.5 % topical spray 1 Application  1 Application Topical PRN  Avelino Leeds Arlyn Leak, CNM       bisacodyl (DULCOLAX) suppository 10 mg  10 mg Rectal Daily PRN Avelino Leeds Arlyn Leak, CNM       coconut oil  1 Application Topical PRN Avelino Leeds Arlyn Leak, CNM       witch hazel-glycerin (TUCKS) pad 1 Application  1 Application Topical PRN Avelino Leeds Arlyn Leak, CNM       And   dibucaine (NUPERCAINAL) 1 % rectal ointment 1 Application  1 Application Rectal PRN Avelino Leeds Arlyn Leak, CNM       diphenhydrAMINE (BENADRYL) capsule 25 mg  25 mg Oral Q6H PRN Avelino Leeds Arlyn Leak, CNM       labetalol (NORMODYNE) injection 20 mg  20 mg Intravenous PRN Avelino Leeds Arlyn Leak, CNM   20 mg at 08/10/22 1110   And   labetalol (NORMODYNE) injection 40 mg  40 mg Intravenous PRN Avelino Leeds Arlyn Leak, CNM       And   labetalol (NORMODYNE) injection 80 mg  80 mg Intravenous PRN Avelino Leeds Arlyn Leak, CNM       And   hydrALAZINE (APRESOLINE) injection 10 mg  10 mg Intravenous PRN Avelino Leeds Arlyn Leak, CNM       ibuprofen (ADVIL) tablet 600 mg  600 mg Oral [id number] [place], Felicia Lucy Lorena, CNM       lactated ringers infusion   Intravenous Continuous Avelino Leeds [place], CNM 75 mL/hr at 08/10/22 1225 Infusion Verify at 08/10/22 1225   magnesium sulfate 40 grams in SWI 1000  mL OB infusion  2 g/hr Intravenous Titrated Avelino Leeds Arlyn Leak, CNM 50 mL/hr at 08/10/22 1224 2 g/hr at 08/10/22 1224   medroxyPROGESTERone (DEPO-PROVERA) injection 150 mg  150 mg Intramuscular Prior to discharge Avelino Leeds Arlyn Leak, CNM       NIFEdipine (PROCARDIA-XL/NIFEDICAL-XL) 24 hr tablet 30 mg  30 mg Oral Daily Avelino Leeds Lucy Lorena, CNM   30 mg at 08/10/22 1254   ondansetron (ZOFRAN) tablet 4 mg  4 mg Oral Q4H PRN Avelino Leeds Arlyn Leak, CNM       Or   ondansetron [facility]) injection 4 mg  4 mg Intravenous Q4H PRN Avelino Leeds Arlyn Leak, CNM        prenatal multivitamin tablet 1 tablet  1 tablet Oral [id number] Avelino Leeds Arlyn Leak, CNM   1 tablet at 08/10/22 1254   senna-docusate (Senokot-S) tablet 2 tablet  2 tablet Oral [id number] Avelino Leeds Arlyn Leak, CNM   2 tablet at 08/10/22 1254   simethicone (MYLICON) chewable tablet 80 mg  80 mg Oral PRN Avelino Leeds Arlyn Leak, CNM       sodium phosphate (FLEET) 7-19 GM/118ML enema 1 enema  1 enema Rectal Daily PRN Avelino Leeds Arlyn Leak, CNM       varicella virus vaccine live (VARIVAX) injection 0.5 mL  0.5 mL Subcutaneous Prior to discharge Avelino Leeds Arlyn Leak, CNM       zolpidem (AMBIEN) tablet 5 mg  5 mg Oral QHS PRN Avelino Leeds Arlyn Leak, CNM        Musculoskeletal: Strength & Muscle Tone: decreased Gait & Station:  did not witness Patient leans: N/A Psychiatric Specialty Exam: Physical Exam Vitals and nursing note reviewed.  Constitutional:      Appearance: Normal appearance.  HENT:     Head: Normocephalic.     Nose: Nose normal.  Pulmonary:     Effort: Pulmonary effort is normal.  Musculoskeletal:     Cervical back: Normal range of motion.  Neurological:     General: No focal deficit present.     Mental Status: She is alert and oriented to person, place, and time.  Psychiatric:        Attention and Perception: Attention and perception normal.        Mood and Affect: Affect is blunt.        Speech: Speech normal.        Behavior: Behavior normal. Behavior is cooperative.        Thought Content: Thought content normal.        Cognition and Memory: Cognition and memory normal.        Judgment: Judgment normal.     Review of Systems  Psychiatric/Behavioral:  Positive for substance abuse.   All other systems reviewed and are negative.   Blood pressure (!) 141/87, pulse 94, temperature 97.8 F (36.6 C), temperature source Oral, resp. rate 16, height 5\' 5"  (1.651 m), weight 93 kg, SpO2 98 %.Body mass index is 34.11 kg/m.  General  Appearance: Casual  Eye Contact:  Fair  Speech:  Normal Rate  Volume:  Decreased  Mood:  Euthymic  Affect:  Blunt  Thought Process:  Coherent  Orientation:  Full (Time, Place, and Person)  Thought Content:  Logical  Suicidal Thoughts:  No  Homicidal Thoughts:  No  Memory:  Immediate;   Fair Recent;   Fair Remote;   Fair  Judgement:  Fair  Insight:  Lacking  Psychomotor Activity:  Decreased  Concentration:  Concentration:  Fair and Attention Span: Fair  Recall:  [employer] of Knowledge:  Fair  Language:  Good  Akathisia:  No  Handed:  Right  AIMS (if indicated):     Assets:  Leisure Time Physical Health Resilience  ADL's:  Intact  Cognition:  WNL  Sleep:        Physical Exam: Physical Exam Vitals and nursing note reviewed.  Constitutional:      Appearance: Normal appearance.  HENT:     Head: Normocephalic.     Nose: Nose normal.  Pulmonary:     Effort: Pulmonary effort is normal.  Musculoskeletal:     Cervical back: Normal range of motion.  Neurological:     General: No focal deficit present.     Mental Status: She is alert and oriented to person, place, and time.  Psychiatric:        Attention and Perception: Attention and perception normal.        Mood and Affect: Affect is blunt.        Speech: Speech normal.        Behavior: Behavior normal. Behavior is cooperative.        Thought Content: Thought content normal.        Cognition and Memory: Cognition and memory normal.        Judgment: Judgment normal.    Review of Systems  Psychiatric/Behavioral:  Positive for substance abuse.   All other systems reviewed and are negative.  Blood pressure (!) 141/87, pulse 94, temperature 97.8 F (36.6 C), temperature source Oral, resp. rate 16, height 5\' 5"  (1.651 m), weight 93 kg, SpO2 98 %. Body mass index is 34.11 kg/m.  Treatment Plan Summary: Polysubstance abuse: TOC consult for substance abuse rehab resources  Disposition: No evidence of imminent risk to  self or others at present.   Patient does not meet criteria for psychiatric inpatient admission. Supportive therapy provided about ongoing stressors.  Waylan Boga, NP 08/10/2022 2:10 PM

## 2022-08-10 NOTE — Progress Notes (Signed)
CSW contacted [facility] non emergency, [phone]. CSW was told a message would be passed along to [facility] CPS to give CSW a call back.

## 2022-08-10 NOTE — Discharge Summary (Signed)
Obstetrical Discharge Summary  Patient Name: Susan [name] DOB: [birth year] MRN: [id number]  Date of Admission: 08/10/2022 Date of Delivery: 08/10/22 Delivered by: Avelino Leeds, CNM/EMS  Date of Discharge: 08/11/2022  Primary OB:  None LMP:No LMP recorded. Patient is pregnant. EDC Estimated Date of Delivery: 08/18/22 Gestational Age at Delivery: [redacted]w[redacted]d   Antepartum complications:  No prenatal care other than visits to [facility] and with MFM; prenatal labs done during her hospital admissions.  Severe IUGR, last growth 06/03/22 with EFW 13%ile at MFM, normal dopplers.  Substance abuse: tobacco, MJ and amphetamines.  Obesity, BMI 32 Hx Pre-Eclampsia with severe features in previous pregnancy Prior LTCS with 2 successful VBAC Hx 33wk preterm birth with last delivery 04/2021 2 hospital admissions for suspected PID in pregnancy- 69 and 25wks Unstable housing situation, last admission was DC home to live with her sister.   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 She was discovered to have pre-eclampsia with severe features and was placed on magnesium.  Delivery Type: spontaneous vaginal delivery Anesthesia: non-pharmacological methods Placenta: spontaneous To Pathology: No  Laceration: n/a Episiotomy: none Newborn Data: Live born female  Birth Weight:  5lb 12.1oz APGAR: unknown, delivered prior to arrival  Newborn Delivery   Birth date/time: 08/10/2022 09:49:00 Delivery type: Vaginal,  Spontaneous      Postpartum Procedures:  postpartum magnesium, IVC [place]:      No data to display           Post partum course:   Patient had an uncomplicated postpartum course.  By time of discharge on PPD#1, her pain was controlled on oral pain medications; she had appropriate lochia and was ambulating, voiding without difficulty and tolerating regular diet.  CPS took custody of infant and patient stated she was going to leave AMA and harm herself. IVC hold placed on patient and psych saw her. She will be transferred to psych from postpartum.  Discharge Physical Exam:  BP (!) 142/88 (BP Location: Right Arm)   Pulse 79   Temp 98.4 F (36.9 C) (Oral)   Resp 20   Ht 5\' 5"  (1.651 m)   Wt 93 kg   SpO2 99%   BMI 34.11 kg/m   General: NAD CV: RRR Pulm: CTABL, nl effort ABD: s/nd/nt, fundus firm and below the umbilicus Lochia: moderate Perineum: minimal edema/intact DVT Evaluation: LE non-ttp, no evidence of DVT on exam.  Hemoglobin  Date Value Ref Range Status  08/11/2022 10.0 (L) 12.0 - 15.0 g/dL Final   HCT  Date Value Ref Range Status  08/11/2022 30.8 (L) 36.0 - 46.0 % Final    Risk assessment for postpartum VTE and prophylactic treatment: Very high risk factors: None High risk factors: None Moderate risk factors: Tobacco use > 1/2 PPD, Preeclampsia , and BMI 30-40 kg/m2  Postpartum VTE prophylaxis with LMWH  needs   Disposition: stable, discharge to home. Baby Feeding: formula feeding Baby Disposition: home with mom  Rh Immune globulin indicated: No Rubella vaccine given: was not indicated Varivax vaccine given: was not indicated Flu vaccine given in AP setting: No Tdap vaccine given  in AP setting: No  Contraception: Depo-Provera  Prenatal Labs:  Blood type/Rh O pos  Antibody screen neg  Rubella immune   Varicella Non immune   RPR NR  HBsAg Neg   Hep C Neg   HIV NR  GC Neg   Chlamydia Neg   Genetic screening Unable to collect   1 hour GTT  138  3 hour GTT N/A  GBS Not due     Plan:  Susan [name] was transferred to psych in good condition.  Discharge Medications: Allergies as of 08/11/2022   Not on File      Medication List    You have not been prescribed any medications.      Follow-up Information     Avelino Leeds Arlyn Leak, CNM Follow up in 2 week(s).   Specialty: Obstetrics Why: mood check Contact information: [place] [address] [phone]         Ed Blalock, CNM Follow up in 6 week(s).   Specialty: Obstetrics Why: pp visit Contact information: [place] [address] [phone]                 Signed: Gertie Fey, [place] 08/11/2022 8:27 PM

## 2022-08-10 NOTE — H&P (Cosign Needed Addendum)
OB History & Physical   History of Present Illness:   Chief Complaint: precipitous delivery  HPI:  Susan [name] is a 27 y.o. G21P0 female at [redacted]w[redacted]d, No LMP recorded. Patient is pregnant.,Estimated Date of Delivery: 08/18/22.  She presents to L&D for precipitous delivery via EMS   Factors complicating pregnancy:  No prenatal care other than visits to [facility] and with MFM; prenatal labs done during her hospital admissions.  Severe IUGR, last growth 06/03/22 with EFW 13%ile at MFM, normal dopplers.  Substance abuse: tobacco, MJ and amphetamines.  Obesity, BMI 32 Hx Pre-Eclampsia with severe features in previous pregnancy Prior LTCS with 2 successful VBAC Hx 33wk preterm birth with last delivery 04/2021 2 hospital admissions for suspected PID in pregnancy- 37 and 25wks Unstable housing situation, last admission was DC home to live with her sister.   Patient Active Problem List   Diagnosis Date Noted   Normal labor and delivery 08/10/2022    Prenatal Transfer Tool  Maternal Diabetes: No Genetic Screening: Normal Maternal Ultrasounds/Referrals: Normal Fetal Ultrasounds or other Referrals:  None Maternal Substance Abuse:  Yes:  Type: Smoker, Marijuana, Cocaine, Other: Amphetamines Significant Maternal Medications:  None Significant Maternal Lab Results: None  Maternal Medical History:  History reviewed. No pertinent past medical history.  History reviewed. No pertinent surgical history.  Not on File  Prior to Admission medications   Not on File     Prenatal care site:  none  OB History  Gravida Para Term Preterm AB Living  4 0 0 0 0 2  SAB IAB Ectopic Multiple Live Births  0 0 0 0 0    # Outcome Date GA Lbr Len/2nd Weight Sex Delivery Anes PTL Lv  4 Current           3 Gravida           2 Gravida           1 [place]              Social History: She  reports that she has been smoking cigarettes. She has been smoking an average of 1 pack per day. She has never used  smokeless tobacco. She reports current drug use. Drugs: Marijuana, Cocaine, and Methamphetamines.  Family History: family history is not on file.   Review of Systems: A full review of systems was performed and negative except as noted in the HPI.     Physical Exam:  Vital Signs: BP (!) 162/101   Pulse (!) 112   Temp 98.4 F (36.9 C) (Oral)   Resp 18   Ht 5\' 5"  (1.651 m)   Wt 93 kg   BMI 34.11 kg/m   General: no acute distress.  HEENT: normocephalic, atraumatic Heart: regular rate & rhythm Lungs: normal respiratory effort Abdomen: soft, gravid, non-tender;  Pelvic:   External: Normal external female genitalia  Cervix:   /   /      Extremities: non-tender, symmetric,  edema bilaterally.    Neurologic: Alert & oriented x 3.    Results for orders placed or performed during the hospital encounter of 08/10/22 (from the past 24 hour(s))  Protein / creatinine ratio, urine     Status: Abnormal   Collection Time: 08/10/22 10:35 AM  Result Value Ref Range   Creatinine, Urine 106 mg/dL   Total Protein, Urine 49 mg/dL   Protein Creatinine Ratio 0.46 (H) 0.00 - 0.15 mg/mg[Cre]  CBC     Status: Abnormal   Collection Time: 08/10/22  10:35 AM  Result Value Ref Range   WBC 14.0 (H) 4.0 - 10.5 K/uL   RBC 3.86 (L) 3.87 - 5.11 MIL/uL   Hemoglobin 10.8 (L) 12.0 - 15.0 g/dL   HCT 33.2 (L) 36.0 - 46.0 %   MCV 86.0 80.0 - 100.0 fL   MCH 28.0 26.0 - 34.0 pg   MCHC 32.5 30.0 - 36.0 g/dL   RDW 16.6 (H) 11.5 - 15.5 %   Platelets 250 150 - 400 K/uL   nRBC 0.0 0.0 - 0.2 %  Comprehensive metabolic panel     Status: Abnormal   Collection Time: 08/10/22 10:35 AM  Result Value Ref Range   Sodium 133 (L) 135 - 145 mmol/L   Potassium 3.5 3.5 - 5.1 mmol/L   Chloride 107 98 - 111 mmol/L   CO2 18 (L) 22 - 32 mmol/L   Glucose, Bld 148 (H) 70 - 99 mg/dL   BUN 13 6 - 20 mg/dL   Creatinine, Ser 0.57 0.44 - 1.00 mg/dL   Calcium 8.2 (L) 8.9 - 10.3 mg/dL   Total Protein 6.6 6.5 - 8.1 g/dL   Albumin 2.5  (L) 3.5 - 5.0 g/dL   AST 121 (H) 15 - 41 U/L   ALT 120 (H) 0 - 44 U/L   Alkaline Phosphatase 378 (H) 38 - 126 U/L   Total Bilirubin 0.8 0.3 - 1.2 mg/dL   GFR, Estimated >60 >60 mL/min   Anion gap 8 5 - 15  Urine Drug Screen, Qualitative ([facility] only)     Status: Abnormal   Collection Time: 08/10/22 10:49 AM  Result Value Ref Range   Tricyclic, Ur Screen NONE DETECTED NONE DETECTED   Amphetamines, Ur Screen POSITIVE (A) NONE DETECTED   MDMA (Ecstasy)Ur Screen NONE DETECTED NONE DETECTED   Cocaine Metabolite,Ur [place] POSITIVE (A) NONE DETECTED   Opiate, Ur Screen NONE DETECTED NONE DETECTED   Phencyclidine (PCP) Ur S NONE DETECTED NONE DETECTED   Cannabinoid 50 Ng, Ur [place] POSITIVE (A) NONE DETECTED   Barbiturates, Ur Screen NONE DETECTED NONE DETECTED   Benzodiazepine, Ur Scrn NONE DETECTED NONE DETECTED   Methadone Scn, Ur NONE DETECTED NONE DETECTED    Pertinent Results:  Prenatal Labs: Blood type/Rh O pos  Antibody screen neg  Rubella immune   Varicella Non immune   RPR NR  HBsAg Neg   Hep C Neg   HIV NR  GC Neg   Chlamydia Neg   Genetic screening Unable to collect   1 hour GTT 138  3 hour GTT N/A  GBS Not due    No results found.  Assessment:  Susan [name] is a 27 y.o. G53P0 female at [redacted]w[redacted]d with history of severe IUGR, substance abuse, obesity, preeclampsia, prior LTCS with 2 successful VBAC.was brought in by EMS with a precipitous delivery. .   Plan:  1. Admit to Labor & Delivery - consents reviewed and obtained  - Dr. Ouida Sills notified of admission and plan of care   2. Magnesium Sulfate 4g/2g/hr for preeclampsia  3. Start Procardia Xl 30 mg daily. Monitor BP  4. Conditional Labetalol orders  5. Foley Catheter in place for strict I'O's   5. Post Partum Planning: - Infant feeding: formula feeding - Contraception: Depo-Provera - Tdap vaccine:  not given - Flu vaccine:  not given    Avelino Leeds, CNM Certified Nurse Midwife [facility]

## 2022-08-10 NOTE — Progress Notes (Signed)
Postpartum Day  0  Subjective: no complaints, voiding, tolerating PO, and + flatus  Doing well, no concerns. Ambulating without difficulty, pain managed with PO meds, tolerating regular diet, and voiding without difficulty.   No fever/chills, chest pain, shortness of breath, nausea/vomiting, or leg pain. No nipple or breast pain. No headache, visual changes, or RUQ/epigastric pain.  Objective: BP (!) 133/118 (BP Location: Left Arm)   Pulse 92   Temp 97.8 F (36.6 C) (Axillary)   Resp 20   Ht 5\' 5"  (1.651 m)   Wt 93 kg   SpO2 100%   BMI 34.11 kg/m    Physical Exam:  General: alert, cooperative, and tearful Breasts: soft/nontender CV: RRR Pulm: nl effort, CTABL Abdomen: soft, non-tender, active bowel sounds Uterine Fundus: firm Perineum: minimal edema, intact Lochia: appropriate DVT Evaluation: No evidence of DVT seen on physical exam. Negative Homan's sign. No cords or calf tenderness. No significant calf/ankle edema.  Recent Labs    08/10/22 1035  HGB 10.8*  HCT 33.2*  WBC 14.0*  PLT 250    Assessment/Plan: 27 y.o. G4P0 postpartum day # 0  -Continue routine postpartum care Encouraged snug fitting bra, cold application, Tylenol PRN, and cabbage leaves for engorgement for formula feeding  -magnesium sulfate 2gm/hr for preeclampsia -conditional labetalol  -Strict I'O( current output 1659ml)   -Immunization status:   needs Varicella prior to discharge    Disposition: Continue inpatient postpartum care   LOS: 0 days   ----- Avelino Leeds Certified Nurse Midwife [facility]

## 2022-08-11 ENCOUNTER — Encounter: Payer: Self-pay | Admitting: Obstetrics and Gynecology

## 2022-08-11 ENCOUNTER — Encounter: Payer: Self-pay | Admitting: Psychiatry

## 2022-08-11 ENCOUNTER — Other Ambulatory Visit: Payer: Self-pay

## 2022-08-11 ENCOUNTER — Inpatient Hospital Stay
Admission: AD | Admit: 2022-08-11 | Discharge: 2022-08-13 | DRG: 776 | Disposition: A | Discharge status: Home / Self Care | Payer: No Typology Code available for payment source | Source: Intra-hospital | Attending: Psychiatry | Admitting: Psychiatry

## 2022-08-11 DIAGNOSIS — F1410 Cocaine abuse, uncomplicated: Secondary | ICD-10-CM | POA: Insufficient documentation

## 2022-08-11 DIAGNOSIS — F4325 Adjustment disorder with mixed disturbance of emotions and conduct: Principal | ICD-10-CM | POA: Diagnosis present

## 2022-08-11 DIAGNOSIS — Z046 Encounter for general psychiatric examination, requested by authority: Secondary | ICD-10-CM

## 2022-08-11 DIAGNOSIS — F1510 Other stimulant abuse, uncomplicated: Secondary | ICD-10-CM | POA: Insufficient documentation

## 2022-08-11 DIAGNOSIS — F1210 Cannabis abuse, uncomplicated: Secondary | ICD-10-CM | POA: Insufficient documentation

## 2022-08-11 DIAGNOSIS — O99345 Other mental disorders complicating the puerperium: Principal | ICD-10-CM | POA: Diagnosis present

## 2022-08-11 DIAGNOSIS — O99893 Other specified diseases and conditions complicating puerperium: Secondary | ICD-10-CM | POA: Diagnosis present

## 2022-08-11 DIAGNOSIS — R45851 Suicidal ideations: Secondary | ICD-10-CM | POA: Diagnosis present

## 2022-08-11 DIAGNOSIS — F17210 Nicotine dependence, cigarettes, uncomplicated: Secondary | ICD-10-CM | POA: Diagnosis present

## 2022-08-11 DIAGNOSIS — O99335 Smoking (tobacco) complicating the puerperium: Secondary | ICD-10-CM | POA: Diagnosis present

## 2022-08-11 DIAGNOSIS — Z7151 Drug abuse counseling and surveillance of drug abuser: Secondary | ICD-10-CM | POA: Diagnosis not present

## 2022-08-11 DIAGNOSIS — O99325 Drug use complicating the puerperium: Secondary | ICD-10-CM | POA: Diagnosis present

## 2022-08-11 LAB — CBC
HCT: 30.8 % — ABNORMAL LOW (ref 36.0–46.0)
Hemoglobin: 10 g/dL — ABNORMAL LOW (ref 12.0–15.0)
MCH: 28.1 pg (ref 26.0–34.0)
MCHC: 32.5 g/dL (ref 30.0–36.0)
MCV: 86.5 fL (ref 80.0–100.0)
Platelets: 268 10*3/uL (ref 150–400)
RBC: 3.56 MIL/uL — ABNORMAL LOW (ref 3.87–5.11)
RDW: 16.8 % — ABNORMAL HIGH (ref 11.5–15.5)
WBC: 12.4 10*3/uL — ABNORMAL HIGH (ref 4.0–10.5)
nRBC: 0 % (ref 0.0–0.2)

## 2022-08-11 LAB — SARS CORONAVIRUS 2 BY RT PCR: SARS Coronavirus 2 by RT PCR: NEGATIVE

## 2022-08-11 MED ORDER — ALUM & MAG HYDROXIDE-SIMETH 200-200-20 MG/5ML PO SUSP: 30.0000 mL | ORAL | Status: DC | PRN

## 2022-08-11 MED ORDER — LORAZEPAM 2 MG/ML IJ SOLN: 2.0000 mg | INTRAMUSCULAR | Status: AC

## 2022-08-11 MED ORDER — COCONUT OIL OIL: 1.0000 | TOPICAL_OIL | Status: DC | PRN

## 2022-08-11 MED ORDER — NICOTINE POLACRILEX 2 MG MT GUM: 2.0000 mg | CHEWING_GUM | OROMUCOSAL | Status: DC | PRN

## 2022-08-11 MED ORDER — LORAZEPAM 1 MG PO TABS
1.0000 mg | ORAL_TABLET | ORAL | Status: AC | PRN
  Administered 2022-08-11: 1 mg via ORAL
  Filled 2022-08-11: qty 1

## 2022-08-11 MED ORDER — NIFEDIPINE ER OSMOTIC RELEASE 30 MG PO TB24: 60.0000 mg | ORAL_TABLET | Freq: Every day | ORAL | Status: DC

## 2022-08-11 MED ORDER — WITCH HAZEL-GLYCERIN EX PADS: 1.0000 | MEDICATED_PAD | CUTANEOUS | Status: DC | PRN

## 2022-08-11 MED ORDER — NIFEDIPINE ER OSMOTIC RELEASE 30 MG PO TB24
30.0000 mg | ORAL_TABLET | Freq: Once | ORAL | Status: AC
  Administered 2022-08-11: 30 mg via ORAL
  Filled 2022-08-11: qty 1

## 2022-08-11 MED ORDER — SENNOSIDES-DOCUSATE SODIUM 8.6-50 MG PO TABS: 2.0000 | ORAL_TABLET | ORAL | Status: DC

## 2022-08-11 MED ORDER — RISPERIDONE 1 MG PO TBDP: 2.0000 mg | ORAL_TABLET | Freq: Three times a day (TID) | ORAL | Status: DC | PRN

## 2022-08-11 MED ORDER — LORATADINE 10 MG PO TABS
10.0000 mg | ORAL_TABLET | Freq: Every day | ORAL | Status: DC
  Administered 2022-08-12 – 2022-08-13 (×2): 10 mg via ORAL
  Filled 2022-08-11 (×2): qty 1

## 2022-08-11 MED ORDER — HALOPERIDOL LACTATE 5 MG/ML IJ SOLN: 5.0000 mg | Freq: Four times a day (QID) | INTRAMUSCULAR | Status: DC | PRN

## 2022-08-11 MED ORDER — DIBUCAINE (PERIANAL) 1 % EX OINT: 1.0000 | TOPICAL_OINTMENT | CUTANEOUS | Status: DC | PRN

## 2022-08-11 MED ORDER — HYDROXYZINE HCL 50 MG PO TABS
50.0000 mg | ORAL_TABLET | Freq: Four times a day (QID) | ORAL | Status: DC | PRN
  Administered 2022-08-12 (×2): 50 mg via ORAL
  Filled 2022-08-11 (×2): qty 1

## 2022-08-11 MED ORDER — ZIPRASIDONE MESYLATE 20 MG IM SOLR: 20.0000 mg | INTRAMUSCULAR | Status: DC | PRN

## 2022-08-11 MED ORDER — LORAZEPAM 1 MG PO TABS: 1.0000 mg | ORAL_TABLET | ORAL | Status: DC | PRN

## 2022-08-11 MED ORDER — SIMETHICONE 80 MG PO CHEW
80.0000 mg | CHEWABLE_TABLET | ORAL | Status: DC | PRN
  Administered 2022-08-11: 80 mg via ORAL
  Filled 2022-08-11: qty 1

## 2022-08-11 MED ORDER — HALOPERIDOL 5 MG PO TABS
5.0000 mg | ORAL_TABLET | Freq: Four times a day (QID) | ORAL | Status: DC | PRN
  Administered 2022-08-11 – 2022-08-12 (×2): 5 mg via ORAL
  Filled 2022-08-11 (×2): qty 1

## 2022-08-11 MED ORDER — NICOTINE 14 MG/24HR TD PT24
14.0000 mg | MEDICATED_PATCH | Freq: Every day | TRANSDERMAL | Status: DC
  Administered 2022-08-11: 14 mg via TRANSDERMAL

## 2022-08-11 MED ORDER — HALOPERIDOL 5 MG PO TABS: 5.0000 mg | ORAL_TABLET | Freq: Four times a day (QID) | ORAL | Status: DC | PRN

## 2022-08-11 MED ORDER — LORAZEPAM 1 MG PO TABS
2.0000 mg | ORAL_TABLET | ORAL | Status: AC
  Administered 2022-08-11: 2 mg via ORAL
  Filled 2022-08-11: qty 2

## 2022-08-11 MED ORDER — IBUPROFEN 600 MG PO TABS
600.0000 mg | ORAL_TABLET | Freq: Four times a day (QID) | ORAL | Status: DC
  Administered 2022-08-11 – 2022-08-13 (×4): 600 mg via ORAL
  Filled 2022-08-11 (×4): qty 1

## 2022-08-11 MED ORDER — FLEET ENEMA 7-19 GM/118ML RE ENEM: 1.0000 | ENEMA | Freq: Every day | RECTAL | Status: DC | PRN

## 2022-08-11 MED ORDER — NICOTINE 14 MG/24HR TD PT24
14.0000 mg | MEDICATED_PATCH | Freq: Every day | TRANSDERMAL | Status: DC
  Administered 2022-08-12 – 2022-08-13 (×2): 14 mg via TRANSDERMAL
  Filled 2022-08-11 (×2): qty 1

## 2022-08-11 MED ORDER — HYDROXYZINE HCL 25 MG PO TABS: 50.0000 mg | ORAL_TABLET | Freq: Four times a day (QID) | ORAL | Status: DC | PRN

## 2022-08-11 MED ORDER — MAGNESIUM HYDROXIDE 400 MG/5ML PO SUSP: 30.0000 mL | Freq: Every day | ORAL | Status: DC | PRN

## 2022-08-11 MED ORDER — NIFEDIPINE ER 60 MG PO TB24
60.0000 mg | ORAL_TABLET | Freq: Every day | ORAL | Status: DC
  Administered 2022-08-12 – 2022-08-13 (×2): 60 mg via ORAL
  Filled 2022-08-11 (×2): qty 1

## 2022-08-11 MED ORDER — ACETAMINOPHEN 325 MG PO TABS
650.0000 mg | ORAL_TABLET | Freq: Four times a day (QID) | ORAL | Status: DC | PRN
  Administered 2022-08-13: 650 mg via ORAL
  Filled 2022-08-11: qty 2

## 2022-08-11 MED ORDER — LORATADINE 10 MG PO TABS
10.0000 mg | ORAL_TABLET | Freq: Every day | ORAL | Status: DC
  Administered 2022-08-11: 10 mg via ORAL
  Filled 2022-08-11: qty 1

## 2022-08-11 NOTE — Consult Note (Signed)
[facility] Consult   Reason for Consult: Stat consult for 27 year old woman who became acutely agitated on the mother baby unit and was reported to have made statements about hurting herself Referring Physician: Redmond Pulling Patient Identification: [facility] MRN:  [id number] Principal Diagnosis: Adjustment disorder with mixed disturbance of emotions and conduct Diagnosis:  Principal Problem:   Adjustment disorder with mixed disturbance of emotions and conduct Active Problems:   Normal labor and delivery   Polysubstance abuse   Total Time spent with patient: 45 minutes  Subjective:   Chasie Finefrock is a 27 y.o. female patient admitted with "I just need to get out of here".  HPI: Patient seen and chart reviewed.  27 year old woman with a history of substance abuse issues who was on the mother baby unit where she gave birth to a healthy infant yesterday.  Today the Department of Social Services came to the ward and took custody of the child.  Patient became acutely agitated.  Nursing states that the patient made statements that she was going to leave here and hurt herself and that she had nothing to live for.  I came to see the patient and the patient was tearful and agitated.  She understood the basic situation she was not delirious.  When I ask her about intention to hurt herself she denied it.  She however did not seem to be able to articulate a clear plan for self-care saying that she thought she would be staying in a trailer.  Could not articulate any sources of support.  Patient admits she had been using cocaine and methamphetamine during pregnancy.  Review of the chart shows that family has been bringing tobacco products to her on the unit suggesting a less than fully responsible living situation.  Patient very agitated difficult to calm down becoming more animated physically trying to leave.  Past Psychiatric History: Patient has a history of previous behavior very similar  when she had a child in 2021 resulting in a brief hospitalization on our psychiatric ward.  She also mentions old [facility] which suggest to me she has had at least one other previous admission.  She denies having been on psychiatric medicine.  There is no evidence of psychotic disorder or bipolar disorder.  She does appear to have a standing history of stimulant abuse and tells me she has never been in any sort of rehab or treatment.  Risk to Self:   Risk to Others:   Prior Inpatient Therapy:   Prior Outpatient Therapy:    Past Medical History: History reviewed. No pertinent past medical history. History reviewed. No pertinent surgical history. Family History: History reviewed. No pertinent family history. Family Psychiatric  History: Not available Social History:  Social History   Substance and Sexual Activity  Alcohol Use None     Social History   Substance and Sexual Activity  Drug Use Yes   Types: Marijuana, Cocaine, Methamphetamines    Social History   Socioeconomic History   Marital status: Single    Spouse name: Not on file   Number of children: Not on file   Years of education: Not on file   Highest education level: Not on file  Occupational History   Not on file  Tobacco Use   Smoking status: Every Day    Packs/day: 1    Types: Cigarettes   Smokeless tobacco: Never  Substance and Sexual Activity   Alcohol use: Not on file   Drug use: Yes  Types: Marijuana, Cocaine, Methamphetamines   Sexual activity: Yes  Other Topics Concern   Not on file  Social History Narrative   Not on file   Social Determinants of Health   Financial Resource Strain: Not on file  Food Insecurity: No Food Insecurity (08/10/2022)   Hunger Vital Sign    Worried About Running Out of Food in the Last Year: Never true    Ran Out of Food in the Last Year: Never true  Transportation Needs: No Transportation Needs (08/10/2022)   PRAPARE - Hydrologist  (Medical): No    Lack of Transportation (Non-Medical): No  Physical Activity: Not on file  Stress: Not on file  Social Connections: Not on file   Additional Social History:    Allergies:  Not on File  Labs:  Results for orders placed or performed during the hospital encounter of 08/10/22 (from the past 48 hour(s))  Protein / creatinine ratio, urine     Status: Abnormal   Collection Time: 08/10/22 10:35 AM  Result Value Ref Range   Creatinine, Urine 106 mg/dL   Total Protein, Urine 49 mg/dL    Comment: NO NORMAL RANGE ESTABLISHED FOR THIS TEST   Protein Creatinine Ratio 0.46 (H) 0.00 - 0.15 mg/mg[Cre]    Comment: Performed at [facility], [address] [place], [place] [address]  CBC     Status: Abnormal   Collection Time: 08/10/22 10:35 AM  Result Value Ref Range   WBC 14.0 (H) 4.0 - 10.5 K/uL   RBC 3.86 (L) 3.87 - 5.11 MIL/uL   Hemoglobin 10.8 (L) 12.0 - 15.0 g/dL   HCT 33.2 (L) 36.0 - 46.0 %   MCV 86.0 80.0 - 100.0 fL   MCH 28.0 26.0 - 34.0 pg   MCHC 32.5 30.0 - 36.0 g/dL   RDW 16.6 (H) 11.5 - 15.5 %   Platelets 250 150 - 400 K/uL   nRBC 0.0 0.0 - 0.2 %    Comment: Performed at [facility], [address] [place], [place] [address]  Comprehensive metabolic panel     Status: Abnormal   Collection Time: 08/10/22 10:35 AM  Result Value Ref Range   Sodium 133 (L) 135 - 145 mmol/L   Potassium 3.5 3.5 - 5.1 mmol/L   Chloride 107 98 - 111 mmol/L   CO2 18 (L) 22 - 32 mmol/L   Glucose, Bld 148 (H) 70 - 99 mg/dL    Comment: Glucose reference range applies only to samples taken after fasting for at least 8 hours.   BUN 13 6 - 20 mg/dL   Creatinine, Ser 0.57 0.44 - 1.00 mg/dL   Calcium 8.2 (L) 8.9 - 10.3 mg/dL   Total Protein 6.6 6.5 - 8.1 g/dL   Albumin 2.5 (L) 3.5 - 5.0 g/dL   AST 121 (H) 15 - 41 U/L   ALT 120 (H) 0 - 44 U/L   Alkaline Phosphatase 378 (H) 38 - 126 U/L   Total Bilirubin 0.8 0.3 - 1.2 mg/dL   GFR, Estimated >60 >60 mL/min     Comment: (NOTE) Calculated using the CKD-EPI Creatinine Equation (2021)    Anion gap 8 5 - 15    Comment: Performed at [facility], [address] [place], [place] [address]  Type and screen     Status: None   Collection Time: 08/10/22 10:35 AM  Result Value Ref Range   ABO/RH(D) O POS    Antibody Screen NEG  Sample Expiration      08/13/2022,2359 Performed at [facility], [address] [place], [place] [address]   Urine Drug Screen, Qualitative [facility] only)     Status: Abnormal   Collection Time: 08/10/22 10:49 AM  Result Value Ref Range   Tricyclic, Ur Screen NONE DETECTED NONE DETECTED   Amphetamines, Ur Screen POSITIVE (A) NONE DETECTED   MDMA (Ecstasy)Ur Screen NONE DETECTED NONE DETECTED   Cocaine Metabolite,Ur [place] POSITIVE (A) NONE DETECTED   Opiate, Ur Screen NONE DETECTED NONE DETECTED   Phencyclidine (PCP) Ur S NONE DETECTED NONE DETECTED   Cannabinoid 50 Ng, Ur [place] POSITIVE (A) NONE DETECTED   Barbiturates, Ur Screen NONE DETECTED NONE DETECTED   Benzodiazepine, Ur Scrn NONE DETECTED NONE DETECTED   Methadone Scn, Ur NONE DETECTED NONE DETECTED    Comment: (NOTE) Tricyclics + metabolites, urine    Cutoff 1000 ng/mL Amphetamines + metabolites, urine  Cutoff 1000 ng/mL MDMA (Ecstasy), urine              Cutoff 500 ng/mL Cocaine Metabolite, urine          Cutoff 300 ng/mL Opiate + metabolites, urine        Cutoff 300 ng/mL Phencyclidine (PCP), urine         Cutoff 25 ng/mL Cannabinoid, urine                 Cutoff 50 ng/mL Barbiturates + metabolites, urine  Cutoff 200 ng/mL Benzodiazepine, urine              Cutoff 200 ng/mL Methadone, urine                   Cutoff 300 ng/mL  The urine drug screen provides only a preliminary, unconfirmed analytical test result and should not be used for non-medical purposes. Clinical consideration and professional judgment should be applied to any positive drug screen result due to possible interfering  substances. A more specific alternate chemical method must be used in order to obtain a confirmed analytical result. Gas chromatography / mass spectrometry (GC/MS) is the preferred confirm atory method. Performed at [facility], [address] [place], [place] [address]   CBC     Status: Abnormal   Collection Time: 08/11/22  4:03 AM  Result Value Ref Range   WBC 12.4 (H) 4.0 - 10.5 K/uL   RBC 3.56 (L) 3.87 - 5.11 MIL/uL   Hemoglobin 10.0 (L) 12.0 - 15.0 g/dL   HCT 30.8 (L) 36.0 - 46.0 %   MCV 86.5 80.0 - 100.0 fL   MCH 28.1 26.0 - 34.0 pg   MCHC 32.5 30.0 - 36.0 g/dL   RDW 16.8 (H) 11.5 - 15.5 %   Platelets 268 150 - 400 K/uL   nRBC 0.0 0.0 - 0.2 %    Comment: Performed at [facility], [address] [place], [place] [address]    Current Facility-Administered Medications  Medication Dose Route Frequency Provider Last Rate Last Admin   sodium chloride flush (NS) 0.9 % injection 3 mL  3 mL Intravenous [id number] Bobette Mo, Felicia Arlyn Leak, CNM       And   sodium chloride flush (NS) 0.9 % injection 3 mL  3 mL Intravenous PRN Avelino Leeds Arlyn Leak, CNM       And   0.9 %  sodium chloride infusion  250 mL Intravenous PRN Avelino Leeds Arlyn Leak, CNM       acetaminophen (TYLENOL) tablet 650 mg  650 mg Oral Q4H PRN Avelino Leeds Arlyn Leak, CNM   650 mg at 08/11/22 [id number]   benzocaine-Menthol (DERMOPLAST) 20-0.5 % topical spray 1 Application  1 Application Topical PRN Avelino Leeds Arlyn Leak, CNM   1 Application at [id number]   bisacodyl (DULCOLAX) suppository 10 mg  10 mg Rectal Daily PRN Avelino Leeds Arlyn Leak, CNM       coconut oil  1 Application Topical PRN Avelino Leeds Arlyn Leak, CNM       witch hazel-glycerin (TUCKS) pad 1 Application  1 Application Topical PRN Avelino Leeds Arlyn Leak, CNM   1 Application at [id number]   And   dibucaine (NUPERCAINAL) 1 % rectal ointment 1 Application  1 Application Rectal  PRN Avelino Leeds Arlyn Leak, CNM       diphenhydrAMINE (BENADRYL) capsule 25 mg  25 mg Oral Q6H PRN Avelino Leeds Arlyn Leak, CNM       haloperidol (HALDOL) tablet 5 mg  5 mg Oral Q6H PRN Delana Manganello, Madie Reno, MD       Or   haloperidol lactate (HALDOL) injection 5 mg  5 mg Intramuscular Q6H PRN Anaia Frith T, MD       labetalol (NORMODYNE) injection 20 mg  20 mg Intravenous PRN Avelino Leeds Arlyn Leak, CNM   20 mg at 08/10/22 1846   And   labetalol (NORMODYNE) injection 40 mg  40 mg Intravenous PRN Avelino Leeds Arlyn Leak, CNM       And   labetalol (NORMODYNE) injection 80 mg  80 mg Intravenous PRN Avelino Leeds Arlyn Leak, CNM       And   hydrALAZINE (APRESOLINE) injection 10 mg  10 mg Intravenous PRN Avelino Leeds Arlyn Leak, CNM       ibuprofen (ADVIL) tablet 600 mg  600 mg Oral [id number] Avelino Leeds [place], CNM   600 mg at 08/11/22 [id number]   lactated ringers infusion   Intravenous Continuous Avelino Leeds Arlyn Leak, CNM   Stopped at 08/11/22 0831   loratadine (CLARITIN) tablet 10 mg  10 mg Oral Daily Gertie Fey, CNM   10 mg at 08/11/22 [id number]   LORazepam (ATIVAN) tablet 2 mg  2 mg Oral NOW Caliope Ruppert, Madie Reno, MD       Or   LORazepam (ATIVAN) injection 2 mg  2 mg Intramuscular NOW Laurencia Roma T, MD       medroxyPROGESTERone (DEPO-PROVERA) injection 150 mg  150 mg Intramuscular Prior to discharge Bobette Mo, Felicia Arlyn Leak, CNM       nicotine (NICODERM CQ - dosed in mg/24 hours) patch 14 mg  14 mg Transdermal Daily Gertie Fey, CNM   14 mg at 08/11/22 1330   nicotine polacrilex (NICORETTE) gum 2 mg  2 mg Oral PRN Lillie Portner, Madie Reno, MD       NIFEdipine (PROCARDIA-XL/NIFEDICAL-XL) 24 hr tablet 30 mg  30 mg Oral Daily Avelino Leeds Lucy Lorena, CNM   30 mg at 08/10/22 1254   ondansetron (ZOFRAN) tablet 4 mg  4 mg Oral Q4H PRN Avelino Leeds Arlyn Leak, CNM       Or   ondansetron [facility]) injection 4 mg  4 mg  Intravenous Q4H PRN Avelino Leeds Arlyn Leak, CNM       prenatal multivitamin tablet 1 tablet  1 tablet Oral [id number] Avelino Leeds Arlyn Leak, CNM   1 tablet at 08/10/22 1254   senna-docusate (Senokot-S) tablet 2 tablet  2 tablet Oral [id number] Avelino Leeds Arlyn Leak, CNM  2 tablet at 08/10/22 1254   simethicone (MYLICON) chewable tablet 80 mg  80 mg Oral PRN Avelino Leeds Arlyn Leak, CNM       sodium phosphate (FLEET) 7-19 GM/118ML enema 1 enema  1 enema Rectal Daily PRN Avelino Leeds Arlyn Leak, CNM       varicella virus vaccine live (VARIVAX) injection 0.5 mL  0.5 mL Subcutaneous Prior to discharge Avelino Leeds Lucy Lorena, CNM       zolpidem (AMBIEN) tablet 5 mg  5 mg Oral QHS PRN Avelino Leeds Arlyn Leak, CNM        Musculoskeletal: Strength & Muscle Tone: within normal limits Gait & Station: normal Patient leans: N/A            Psychiatric Specialty Exam:  Presentation  General Appearance: No data recorded Eye Contact:No data recorded Speech:No data recorded Speech Volume:No data recorded Handedness:No data recorded  Mood and Affect  Mood:No data recorded Affect:No data recorded  Thought Process  Thought Processes:No data recorded Descriptions of Associations:No data recorded Orientation:No data recorded Thought Content:No data recorded History of Schizophrenia/Schizoaffective disorder:No data recorded Duration of Psychotic Symptoms:No data recorded Hallucinations:No data recorded Ideas of Reference:No data recorded Suicidal Thoughts:No data recorded Homicidal Thoughts:No data recorded  Sensorium  Memory:No data recorded Judgment:No data recorded Insight:No data recorded  Executive Functions  Concentration:No data recorded Attention Span:No data recorded Recall:No data recorded Fund of Knowledge:No data recorded Language:No data recorded  Psychomotor Activity  Psychomotor Activity:No data recorded  Assets   Assets:No data recorded  Sleep  Sleep:No data recorded  Physical Exam: Physical Exam Vitals and nursing note reviewed.  Constitutional:      Appearance: Normal appearance.  HENT:     Head: Normocephalic and atraumatic.     Mouth/Throat:     Pharynx: Oropharynx is clear.  Eyes:     Pupils: Pupils are equal, round, and reactive to light.  Cardiovascular:     Rate and Rhythm: Normal rate and regular rhythm.  Pulmonary:     Effort: Pulmonary effort is normal.     Breath sounds: Normal breath sounds.  Abdominal:     General: Abdomen is flat.     Palpations: Abdomen is soft.  Musculoskeletal:        General: Normal range of motion.  Skin:    General: Skin is warm and dry.  Neurological:     General: No focal deficit present.     Mental Status: She is alert. Mental status is at baseline.  Psychiatric:        Attention and Perception: She is inattentive.        Mood and Affect: Mood normal. Affect is labile, angry and tearful.        Speech: Speech is rapid and pressured and tangential.        Behavior: Behavior is agitated. Behavior is not aggressive.        Thought Content: Thought content normal.        Cognition and Memory: Cognition normal.        Judgment: Judgment is impulsive and inappropriate.    Review of Systems  Constitutional: Negative.   HENT: Negative.    Eyes: Negative.   Respiratory: Negative.    Cardiovascular: Negative.   Gastrointestinal: Negative.   Musculoskeletal: Negative.   Skin: Negative.   Neurological: Negative.   Psychiatric/Behavioral:  Positive for suicidal ideas. The patient is nervous/anxious.    Blood pressure 129/71, pulse 89, temperature 97.8 F (36.6 C), temperature source Oral, resp.  rate 18, height 5\' 5"  (1.651 m), weight 93 kg, SpO2 97 %. Body mass index is 34.11 kg/m.  Treatment Plan Summary: Medication management and Plan 27 year old woman had a successful vaginal delivery and appears to be calm and without other emergent  medical issues.  She is emotionally overwhelmed and distraught to the point of being unable to calm down and discuss a rational treatment plan.  Nursing has stated more than once that she made comments about hurting herself.  This appears to have been something that occurred in the past as well.  Circumstances suggest probably instability in her support system and ongoing substance abuse outside the hospital.  Under the circumstances I agree with the decision by the obstetrics team to file for involuntary commitment.  I have contacted the rest of our admission staff and suggested admission to the psychiatric unit.  I will be putting in orders for other agitation medications.  Tried to do supportive counseling with the patient.  Disposition: Recommend psychiatric Inpatient admission when medically cleared.  Alethia Berthold, MD 08/11/2022 2:02 PM

## 2022-08-11 NOTE — Tx Team (Signed)
Initial Treatment Plan 08/11/2022 10:16 PM Susan [name] [id number]    PATIENT STRESSORS: Financial difficulties   Medication change or noncompliance   Substance abuse     PATIENT STRENGTHS: Motivation for treatment/growth  Supportive family/friends    PATIENT IDENTIFIED PROBLEMS: Depression  Anxiety  Suicidal Ideation                 DISCHARGE CRITERIA:  Improved stabilization in mood, thinking, and/or behavior Verbal commitment to aftercare and medication compliance  PRELIMINARY DISCHARGE PLAN: Outpatient therapy Return to previous living arrangement  PATIENT/FAMILY INVOLVEMENT: This treatment plan has been presented to and reviewed with the patient, Susan [name].  The patient has been given the opportunity to ask questions and make suggestions.  Mallie Darting, RN 08/11/2022, 10:16 PM

## 2022-08-11 NOTE — Progress Notes (Addendum)
Patient requesting to leave Against Medical Advice, Susan [name], CNM notified.  Patient refusing to sign AMA papers.

## 2022-08-11 NOTE — Progress Notes (Signed)
Baby removed from mothers room per Hope Pigeon at [facility] and placed in nursery. Mother very upset and crying. Mother threatening  to leave AMA but also refusing to leave with out seeing/holding baby. Leone Payor from [facility] notified of update. Philis Nettle, CNM notified and in to see patient.

## 2022-08-11 NOTE — Progress Notes (Addendum)
  CSW notes child to be under custody of CPS, they will come back this afternoon with court order. Mother informed at bedside. Child removed from mother's room, currently under the care of nursing staff.    Per RN mother is leaving AMA.    TOC supervisor updated of above.    Update 1:15: RN Susie called to report patient is threatening to kill herself if she is not able to see her baby (being taken by CPS), CSW had Susie on call with [facility] supervisor Randall Hiss who advised RN to call psych if patient is making these statements. RN reports they were called previously, TOC supervisor informed RN that this is best course of action if patient is making suicidal statements. CSW has called security to round on floor as RN reports patient was having a hard time handling the news of her child being taken by CPS. Security reports they are on their way to the floor.     Kelby Fam, [place], MSW, [place]

## 2022-08-11 NOTE — Progress Notes (Signed)
Patient yelling and told Susie, RN that she is not leaving without her baby. Pt is also refusing any care and is threatening to hurt herself, that she has no reason to live. Lucrezia Europe, CNM at bedside assessing patient. AC called and CNM working on [employer]. NT clearing room for safety and at bedside at all times. Security outside room due to threatening to leave.

## 2022-08-11 NOTE — Plan of Care (Signed)
Patient new to the unit tonight, hasn't had time to progress  Problem: Education: Goal: Knowledge of General Education information will improve Description: Including pain rating scale, medication(s)/side effects and non-pharmacologic comfort measures Outcome: Not Progressing   Problem: Health Behavior/Discharge Planning: Goal: Ability to manage health-related needs will improve Outcome: Not Progressing   Problem: Clinical Measurements: Goal: Ability to maintain clinical measurements within normal limits will improve Outcome: Not Progressing Goal: Will remain free from infection Outcome: Not Progressing Goal: Diagnostic test results will improve Outcome: Not Progressing Goal: Respiratory complications will improve Outcome: Not Progressing Goal: Cardiovascular complication will be avoided Outcome: Not Progressing   Problem: Activity: Goal: Risk for activity intolerance will decrease Outcome: Not Progressing   Problem: Nutrition: Goal: Adequate nutrition will be maintained Outcome: Not Progressing   Problem: Coping: Goal: Level of anxiety will decrease Outcome: Not Progressing   Problem: Elimination: Goal: Will not experience complications related to bowel motility Outcome: Not Progressing Goal: Will not experience complications related to urinary retention Outcome: Not Progressing   Problem: Pain Managment: Goal: General experience of comfort will improve Outcome: Not Progressing   Problem: Safety: Goal: Ability to remain free from injury will improve Outcome: Not Progressing   Problem: Skin Integrity: Goal: Risk for impaired skin integrity will decrease Outcome: Not Progressing   Problem: Education: Goal: Knowledge of [place] General Education information/materials will improve Outcome: Not Progressing Goal: Emotional status will improve Outcome: Not Progressing Goal: Mental status will improve Outcome: Not Progressing Goal: Verbalization of  understanding the information provided will improve Outcome: Not Progressing   Problem: Activity: Goal: Interest or engagement in activities will improve Outcome: Not Progressing Goal: Sleeping patterns will improve Outcome: Not Progressing   Problem: Coping: Goal: Ability to verbalize frustrations and anger appropriately will improve Outcome: Not Progressing Goal: Ability to demonstrate self-control will improve Outcome: Not Progressing   Problem: Health Behavior/Discharge Planning: Goal: Identification of resources available to assist in meeting health care needs will improve Outcome: Not Progressing Goal: Compliance with treatment plan for underlying cause of condition will improve Outcome: Not Progressing   Problem: Physical Regulation: Goal: Ability to maintain clinical measurements within normal limits will improve Outcome: Not Progressing   Problem: Safety: Goal: Periods of time without injury will increase Outcome: Not Progressing   Problem: Education: Goal: Knowledge of condition will improve Outcome: Not Progressing Goal: Individualized Educational Video(s) Outcome: Not Progressing Goal: Individualized Newborn Educational Video(s) Outcome: Not Progressing   Problem: Activity: Goal: Will verbalize the importance of balancing activity with adequate rest periods Outcome: Not Progressing Goal: Ability to tolerate increased activity will improve Outcome: Not Progressing   Problem: Coping: Goal: Ability to identify and utilize available resources and services will improve Outcome: Not Progressing   Problem: Life Cycle: Goal: Chance of risk for complications during the postpartum period will decrease Outcome: Not Progressing   Problem: Role Relationship: Goal: Ability to demonstrate positive interaction with newborn will improve Outcome: Not Progressing   Problem: Skin Integrity: Goal: Demonstration of wound healing without infection will improve Outcome:  Not Progressing   Problem: Education: Goal: Knowledge of disease or condition will improve Outcome: Not Progressing Goal: Understanding of discharge needs will improve Outcome: Not Progressing   Problem: Health Behavior/Discharge Planning: Goal: Ability to identify changes in lifestyle to reduce recurrence of condition will improve Outcome: Not Progressing Goal: Identification of resources available to assist in meeting health care needs will improve Outcome: Not Progressing   Problem: Physical Regulation: Goal: Complications related to the disease process,  condition or treatment will be avoided or minimized Outcome: Not Progressing   Problem: Safety: Goal: Ability to remain free from injury will improve Outcome: Not Progressing

## 2022-08-11 NOTE — BH Assessment (Signed)
Pt  placed  under  IVC  papers  per  Dr  Leafy Ro MD Informed  Janeann Merl RN papers  sent  upstairs

## 2022-08-11 NOTE — Progress Notes (Signed)
Refused all meds from today and ativan ordered by psych MD   pulling on blinds/trying to leave/continues to cry/did not want me back in room because I am telling "lies on her"

## 2022-08-11 NOTE — Progress Notes (Signed)
Was notified by Tillie Rung, RN at [phone] that CPS took custody of patient's infant and patient was upset and wanting to leave AMA. Reviewed that patient is not IVC'd at this time and is capable of making her own medical decisions so if she wanted to leave AMA, she could sign the paperwork and leave AMA.   I got to Mother/Baby around 1250 to speak with patient about leaving AMA and patient is heard loudly sobbing in her room from the nurses station. Quitman Livings, RN came out of the patient's room and stated patient states she is going to leave AMA and harm herself since she is unable to see her baby. I went and spoke to the patient and she is tearful and dressing herself, stating she is leaving. She denied stating she was going to hurt herself and stated "the nurse is lying." I discussed with her that since there is concern that she may harm herself, she is going to be involuntarily committed and is unable to leave the hospital.   Notified on-call psychiatrist Dr. Weber Cooks of patient's statements and notified OB/GYN Dr. Leafy Ro. Security notified of patient's statements of leaving AMA with threats to harm herself. IVC paperwork filled out by Dr. Leafy Ro.   Gertie Fey, [place] 08/11/2022

## 2022-08-11 NOTE — BH Assessment (Signed)
Patient is to be admitted to [facility] BMU today 08/11/22 by Dr. Weber Cooks.  Attending Physician will be Dr.  Weber Cooks .   Patient has been assigned to room 306, by [facility] Nurse, Aeisha Minarik.

## 2022-08-11 NOTE — Progress Notes (Signed)
Post Partum Day 1 Subjective: Doing well, no complaints.  Tolerating regular diet, pain with PO meds, voiding and ambulating without difficulty.  Today reports headache, congestion, decrease in taste/smell, and right ear pain.   No CP SOB Fever,Chills, N/V or leg pain; denies nipple or breast pain, change of vision, RUQ/epigastric pain  Objective: BP 129/71   Pulse 89   Temp 97.8 F (36.6 C) (Oral)   Resp 18   Ht 5\' 5"  (1.651 m)   Wt 93 kg   SpO2 97%   BMI 34.11 kg/m    Physical Exam:  General: NAD Breasts: soft/nontender CV: RRR Pulm: nl effort, CTABL Abdomen: soft, NT, BS x 4 Perineum: minimal edema, intact Lochia: moderate Uterine Fundus: fundus firm and 1 fb below umbilicus DVT Evaluation: no cords, ttp LEs   Recent Labs    08/10/22 1035 08/11/22 0403  HGB 10.8* 10.0*  HCT 33.2* 30.8*  WBC 14.0* 12.4*  PLT 250 268   I/O last 3 completed shifts: In: 3793.5 [P.O.:1230; I.V.:2343.5; Other:220] Out: [id number] [Urine:4875] Total I/O In: 244.1 [P.O.:120; I.V.:124.1] Out: 2600 [Urine:2600]  Vitals:   08/10/22 2230 08/10/22 2320 08/11/22 0020 08/11/22 0120  BP: (!) 146/90 (!) 147/92 138/72 125/62   08/11/22 0220 08/11/22 0320 08/11/22 0420 08/11/22 0520  BP: (!) 129/58 126/68 (!) 137/53 133/87   08/11/22 0620 08/11/22 0722 08/11/22 0821 08/11/22 0832  BP: 132/76 124/61 (!) 141/99 129/71   Assessment/Plan: 27 y.o. [id number] postpartum day # 1  - Continue routine PP care, baby currently in the room with patent, postpartum nurse feeding infant a bottle - encouraged snug fitting bra and cabbage leaves for bottlefeeding.  - Polysubstance abuse: TOC and psych consults placed - Congestion, headache, loss of taste/smell, ear pain: Tylenol, Zyrtec, and respiratory swab ordered. - Pre-eclampsia: magnesium turned off this morning, good urine output, no severe range blood pressures, start Procardia 30mg  XL. Labetalol protocol ordered PRN. Can be transferred to postpartum. -  Discussed contraceptive options including implant, IUDs hormonal and non-hormonal, injection, pills/ring/patch, condoms, and NFP.  - Acute blood loss anemia - hemodynamically stable and asymptomatic; start po ferrous sulfate BID with stool softeners  - Immunization status: Needs varicella  Disposition: Does not desire Dc home today.   Gertie Fey, CNM 08/11/2022 8:54 AM

## 2022-08-11 NOTE — Discharge Summary (Signed)
Report called to [facility] in [facility]. Pt dc;d from system and readmitted in [facility]. To be escorted to [facility] via wc by Shiela Mayer, RN and accompanied by security.

## 2022-08-11 NOTE — Progress Notes (Signed)
Baby removed from room per request from [place] worker   She started crying and told RN if they are going to take her baby she did not want to live   Susan [name] notified of pts statement and [facility] care coordinator notified

## 2022-08-11 NOTE — Progress Notes (Signed)
CSW received a call back from [place] and spoke with [employer]. Caryl Pina stated that she already spoke with RN Linus Orn who gave her the drug screen results of the baby and one incident of patient co sleeping. CSW also added that patient has no baby items and asked CSW to get her an apartment so she can keep her baby. CSW also told Caryl Pina that mom reports she was living with her sister and today the sister got locked up and CPS took all the kids in the home. Caryl Pina sated they screened the case in as a 24 hr and she already told nursing staff that baby cannot discharge until they make a decision. CPS will visit with patient and baby on 08/11/22.

## 2022-08-11 NOTE — Progress Notes (Addendum)
CSW contacted [place] non emergency again due to no call back from CPS. CSW left another message requesting a call back.

## 2022-08-11 NOTE — Progress Notes (Signed)
Patient admitted from [facility] - Mother and Baby unit. Report received from Alinda [place], [place]. Patient presents sad and tearful. Pt stated her only goal is to get to rehab so she can take care of her baby. Pt given education, support, and encouragement to be active in her treatment plan. Pt oriented to the unit and her room. Pt skin assessment completed with Kara Dies, RN. No abnormalities found, pt did have a phone that she brought down from the Mother/Baby unit. Pt given education. Pt being monitored Q 15 minutes for safety per unit protocol, remains safe on the unit

## 2022-08-12 DIAGNOSIS — F1410 Cocaine abuse, uncomplicated: Secondary | ICD-10-CM | POA: Insufficient documentation

## 2022-08-12 DIAGNOSIS — F1210 Cannabis abuse, uncomplicated: Secondary | ICD-10-CM | POA: Insufficient documentation

## 2022-08-12 DIAGNOSIS — F1510 Other stimulant abuse, uncomplicated: Secondary | ICD-10-CM | POA: Insufficient documentation

## 2022-08-12 DIAGNOSIS — F4325 Adjustment disorder with mixed disturbance of emotions and conduct: Secondary | ICD-10-CM

## 2022-08-12 NOTE — H&P (Signed)
Psychiatric Admission Assessment Adult  Patient Identification: Susan [name] MRN:  [id number] Date of Evaluation:  08/12/2022 Chief Complaint:  Adjustment disorder with mixed disturbance of emotions and conduct [F43.25] Principal Diagnosis: Adjustment disorder with mixed disturbance of emotions and conduct Diagnosis:  Principal Problem:   Adjustment disorder with mixed disturbance of emotions and conduct Active Problems:   Amphetamine abuse   Cocaine abuse   Cannabis abuse  History of Present Illness: Patient seen and chart reviewed.  27 year old woman who was seen yesterday on the obstetrics service.  She had just given birth to a child and the child had been taken into custody by the Department of Social Services.  This caused the patient to decompensate emotionally during which time she made suicidal statements.  She remained agitated and difficult to engage when I went up to talk with her and I felt that it was not safe for her to go home and so instituted IVC.  On interview today the patient is less agitated than yesterday but still anxious.  She is completely focused on the idea that she is going to get her child back if she can just "go to rehab".  She does not really seem to have much of an idea of what that entails and does not seem to have a very clear notion of what getting sober would entail.  Patient denies suicidal thoughts and denies psychotic symptoms denies depression denies hopelessness.  Physically stable.  Not showing any major behavior problems right now. Associated Signs/Symptoms: Depression Symptoms:  anxiety, (Hypo) Manic Symptoms:  Impulsivity, Anxiety Symptoms:   None reported Psychotic Symptoms:   None PTSD Symptoms: Patient did give a history of having had multiple traumas throughout her life but does not have a syndrome specifically of PTSD Total Time spent with patient: 30 minutes  Past Psychiatric History: Patient has had counseling at times in the past but has  not had specific psychiatric treatment for a few years since she was a child and that was related to behavior and substance use.  Does not sound like she is ever really engaged in serious substance abuse treatment.  Is the patient at risk to self? Yes.    Has the patient been a risk to self in the past 6 months? Yes.    Has the patient been a risk to self within the distant past? Yes.    Is the patient a risk to others? Yes.    Has the patient been a risk to others in the past 6 months? Yes.    Has the patient been a risk to others within the distant past? Yes.     [place] Scale:  Flowsheet Row Admission (Current) from 08/11/2022 in [facility] ED to Hosp-Admission (Discharged) from 08/10/2022 in [place] No Risk High Risk        Prior Inpatient Therapy: Yes.   If yes, describe years ago as a child Prior Outpatient Therapy: No. If yes, describe has not really engaged  Alcohol Screening: 1. How often do you have a drink containing alcohol?: Never 2. How many drinks containing alcohol do you have on a typical day when you are drinking?: 1 or 2 3. How often do you have six or more drinks on one occasion?: Never AUDIT-C Score: 0 4. How often during the last year have you found that you were not able to stop drinking once you had started?: Never 5. How often during the  last year have you failed to do what was normally expected from you because of drinking?: Never 6. How often during the last year have you needed a first drink in the morning to get yourself going after a heavy drinking session?: Never 7. How often during the last year have you had a feeling of guilt of remorse after drinking?: Never 8. How often during the last year have you been unable to remember what happened the night before because you had been drinking?: Never 9. Have you or someone else been injured as a result of your drinking?: No 10. Has a  relative or friend or a doctor or another health worker been concerned about your drinking or suggested you cut down?: No Alcohol Use Disorder Identification Test Final Score (AUDIT): 0 Substance Abuse History in the last 12 months:  Yes.   Consequences of Substance Abuse: Patient was using methamphetamine and cocaine and cannabis while pregnant. Previous Psychotropic Medications: No  Psychological Evaluations: Yes  Past Medical History: History reviewed. No pertinent past medical history. History reviewed. No pertinent surgical history. Family History: History reviewed. No pertinent family history. Family Psychiatric  History: Reports family history of substance use Tobacco Screening:  Social History   Tobacco Use  Smoking Status Every Day   Packs/day: 1   Types: Cigarettes  Smokeless Tobacco Never    BH Tobacco Counseling     Are you interested in Tobacco Cessation Medications?  Yes, implement Nicotene Replacement Protocol Counseled patient on smoking cessation:  Refused/Declined practical counseling Reason Tobacco Screening Not Completed: Patient Refused Screening       Social History:  Social History   Substance and Sexual Activity  Alcohol Use None     Social History   Substance and Sexual Activity  Drug Use Yes   Types: Marijuana, Cocaine, Methamphetamines    Additional Social History: Marital status: Single Are you sexually active?: Yes What is your sexual orientation?: Bisexual Has your sexual activity been affected by drugs, alcohol, medication, or emotional stress?: Yes Does patient have children?: Yes How many children?: 4 (one died from [place], and three daughters (the newborn is in the NICU)) How is patient's relationship with their children?: Previously noted, "had one visit with daughter who is in the care of family through [place]."                         Allergies:  Not on File Lab Results:  Results for orders placed or performed during the  hospital encounter of 08/10/22 (from the past 48 hour(s))  CBC     Status: Abnormal   Collection Time: 08/11/22  4:03 AM  Result Value Ref Range   WBC 12.4 (H) 4.0 - 10.5 K/uL   RBC 3.56 (L) 3.87 - 5.11 MIL/uL   Hemoglobin 10.0 (L) 12.0 - 15.0 g/dL   HCT 30.8 (L) 36.0 - 46.0 %   MCV 86.5 80.0 - 100.0 fL   MCH 28.1 26.0 - 34.0 pg   MCHC 32.5 30.0 - 36.0 g/dL   RDW 16.8 (H) 11.5 - 15.5 %   Platelets 268 150 - 400 K/uL   nRBC 0.0 0.0 - 0.2 %    Comment: Performed at [facility], [address] [place], [place] [address]  SARS Coronavirus 2 by RT PCR (hospital order, performed in [facility] hospital lab) *cepheid single result test* Anterior Nasal Swab     Status: None   Collection Time: 08/11/22  9:46 AM  Specimen: Anterior Nasal Swab  Result Value Ref Range   SARS Coronavirus 2 by RT PCR NEGATIVE NEGATIVE    Comment: (NOTE) SARS-CoV-2 target nucleic acids are NOT DETECTED.  The SARS-CoV-2 RNA is generally detectable in upper and lower respiratory specimens during the acute phase of infection. The lowest concentration of SARS-CoV-2 viral copies this assay can detect is 250 copies / mL. A negative result does not preclude SARS-CoV-2 infection and should not be used as the sole basis for treatment or other patient management decisions.  A negative result may occur with improper specimen collection / handling, submission of specimen other than nasopharyngeal swab, presence of viral mutation(s) within the areas targeted by this assay, and inadequate number of viral copies (<250 copies / mL). A negative result must be combined with clinical observations, patient history, and epidemiological information.  Fact Sheet for Patients:   https://www.patel.info/  Fact Sheet for Healthcare Providers: https://hall.com/  This test is not yet approved or  cleared by the [place] FDA and has been authorized for detection and/or  diagnosis of SARS-CoV-2 by FDA under an Emergency Use Authorization (EUA).  This EUA will remain in effect (meaning this test can be used) for the duration of the COVID-19 declaration under Section 564(b)(1) of the Act, 21 U.S.C. section 360bbb-3(b)(1), unless the authorization is terminated or revoked sooner.  Performed at [facility], [address] [place], [place] [address]     Blood Alcohol level:  No results found for: "ETH"  Metabolic Disorder Labs:  No results found for: "HGBA1C", "MPG" No results found for: "PROLACTIN" No results found for: "CHOL", "TRIG", "HDL", "CHOLHDL", "VLDL", "LDLCALC"  Current Medications: Current Facility-Administered Medications  Medication Dose Route Frequency Provider Last Rate Last Admin   acetaminophen (TYLENOL) tablet 650 mg  650 mg Oral Q6H PRN Kendrik Mcshan T, MD       alum & mag hydroxide-simeth (MAALOX/MYLANTA) 200-200-20 MG/5ML suspension 30 mL  30 mL Oral Q4H PRN Edrei Norgaard T, MD       coconut oil  1 Application Topical PRN Yoali Conry, Madie Reno, MD       witch hazel-glycerin (TUCKS) pad 1 Application  1 Application Topical PRN Alcie Runions, Madie Reno, MD       And   dibucaine (NUPERCAINAL) 1 % rectal ointment 1 Application  1 Application Rectal PRN Gennell How T, MD       haloperidol (HALDOL) tablet 5 mg  5 mg Oral Q6H PRN Naheem Mosco T, MD   5 mg at 08/11/22 2245   Or   haloperidol lactate (HALDOL) injection 5 mg  5 mg Intramuscular Q6H PRN Rilynne Lonsway, Madie Reno, MD       hydrOXYzine (ATARAX) tablet 50 mg  50 mg Oral Q6H PRN Analis Distler, Madie Reno, MD   50 mg at 08/12/22 1015   ibuprofen (ADVIL) tablet 600 mg  600 mg Oral Q6H Ailey Wessling T, MD   600 mg at 08/12/22 1014   loratadine (CLARITIN) tablet 10 mg  10 mg Oral Daily Traquan Duarte T, MD   10 mg at 08/12/22 1014   magnesium hydroxide (MILK OF MAGNESIA) suspension 30 mL  30 mL Oral Daily PRN Lycan Davee, Madie Reno, MD       nicotine (NICODERM CQ - dosed in mg/24 hours) patch 14 mg  14 mg  Transdermal Daily Nehemiah Montee, Madie Reno, MD   14 mg at 08/12/22 1014   nicotine polacrilex (NICORETTE) gum 2 mg  2 mg Oral PRN Aileana Hodder, Madie Reno, MD  NIFEdipine (ADALAT CC) 24 hr tablet 60 mg  60 mg Oral Daily Gertie Fey, CNM   60 mg at 08/12/22 1014   risperiDONE (RISPERDAL M-TABS) disintegrating tablet 2 mg  2 mg Oral Q8H PRN Bary Limbach, Madie Reno, MD       And   ziprasidone (GEODON) injection 20 mg  20 mg Intramuscular PRN Caden Fatica, Madie Reno, MD       senna-docusate (Senokot-S) tablet 2 tablet  2 tablet Oral Q24H Zaniel Marineau, Madie Reno, MD       simethicone (MYLICON) chewable tablet 80 mg  80 mg Oral PRN Duayne Brideau, Madie Reno, MD   80 mg at 08/11/22 2245   sodium phosphate (FLEET) 7-19 GM/118ML enema 1 enema  1 enema Rectal Daily PRN Nataly Pacifico, Madie Reno, MD       PTA Medications: No medications prior to admission.    Musculoskeletal: Strength & Muscle Tone: within normal limits Gait & Station: normal Patient leans: N/A            Psychiatric Specialty Exam:  Presentation  General Appearance: No data recorded Eye Contact:No data recorded Speech:No data recorded Speech Volume:No data recorded Handedness:No data recorded  Mood and Affect  Mood:No data recorded Affect:No data recorded  Thought Process  Thought Processes:No data recorded Duration of Psychotic Symptoms:N/A Past Diagnosis of Schizophrenia or Psychoactive disorder: No data recorded Descriptions of Associations:No data recorded Orientation:No data recorded Thought Content:No data recorded Hallucinations:No data recorded Ideas of Reference:No data recorded Suicidal Thoughts:No data recorded Homicidal Thoughts:No data recorded  Sensorium  Memory:No data recorded Judgment:No data recorded Insight:No data recorded  Executive Functions  Concentration:No data recorded Attention Span:No data recorded Recall:No data recorded Fund of Knowledge:No data recorded Language:No data recorded  Psychomotor Activity   Psychomotor Activity:No data recorded  Assets  Assets:No data recorded  Sleep  Sleep:No data recorded   Physical Exam: Physical Exam Vitals and nursing note reviewed.  Constitutional:      Appearance: Normal appearance.  HENT:     Head: Normocephalic and atraumatic.     Mouth/Throat:     Pharynx: Oropharynx is clear.  Eyes:     Pupils: Pupils are equal, round, and reactive to light.  Cardiovascular:     Rate and Rhythm: Normal rate and regular rhythm.  Pulmonary:     Effort: Pulmonary effort is normal.     Breath sounds: Normal breath sounds.  Abdominal:     General: Abdomen is flat.     Palpations: Abdomen is soft.  Musculoskeletal:        General: Normal range of motion.  Skin:    General: Skin is warm and dry.  Neurological:     General: No focal deficit present.     Mental Status: She is alert. Mental status is at baseline.  Psychiatric:        Attention and Perception: She is inattentive.        Mood and Affect: Mood is anxious. Affect is blunt.        Speech: Speech is tangential.        Behavior: Behavior is cooperative.        Thought Content: Thought content normal.        Cognition and Memory: Cognition is impaired.        Judgment: Judgment is impulsive.    Review of Systems  Constitutional: Negative.   HENT: Negative.    Eyes: Negative.   Respiratory: Negative.    Cardiovascular: Negative.   Gastrointestinal: Negative.  Musculoskeletal: Negative.   Skin: Negative.   Neurological: Negative.   Psychiatric/Behavioral:  Positive for substance abuse. Negative for depression, hallucinations and suicidal ideas. The patient is nervous/anxious.    Blood pressure (!) 157/99, pulse (!) 101, temperature 98.3 F (36.8 C), temperature source Oral, resp. rate 20, height 5\' 5"  (1.651 m), weight 93 kg, SpO2 99 %. Body mass index is 34.12 kg/m.  Treatment Plan Summary: Plan 27 year old woman with a history of substance abuse of multiple substances.   Yesterday making suicidal statements.  Today denies suicidal ideation.  He is not psychotic and does not describe depression or mania.  She has a very limited social situation.  She tells me that she was actually living in a trailer taking care of multiple nieces and nephews while she was pregnant and of course while she was continuing to use drugs.  Her knowledge and understanding of substance use is very limited.  I think she is high risk not only to relapse but probably to emotionally decompensate still.  On the other hand I am not sure how likely it is that she will succeed with substance abuse treatment given that her only motivation right now is to regain custody of her child which does not seem likely to happen in the immediate future.  I encouraged her to talk with social work and to look into whether there could be treatment options available.  No medication clearly indicated at this point.  Continue 15-minute checks.  Observation Level/Precautions:  15 minute checks  Laboratory:  Chemistry Profile  Psychotherapy:    Medications:    Consultations:    Discharge Concerns:    Estimated LOS:  Other:     Physician Treatment Plan for Primary Diagnosis: Adjustment disorder with mixed disturbance of emotions and conduct Long Term Goal(s): Improvement in symptoms so as ready for discharge  Short Term Goals: Ability to verbalize feelings will improve and Ability to disclose and discuss suicidal ideas  Physician Treatment Plan for Secondary Diagnosis: Principal Problem:   Adjustment disorder with mixed disturbance of emotions and conduct Active Problems:   Amphetamine abuse   Cocaine abuse   Cannabis abuse  Long Term Goal(s): Improvement in symptoms so as ready for discharge  Short Term Goals: Ability to maintain clinical measurements within normal limits will improve  I certify that inpatient services furnished can reasonably be expected to improve the patient's condition.    Alethia Berthold, MD 4/2/20244:21 PM

## 2022-08-12 NOTE — Group Note (Signed)
LCSW Group Therapy Note  Group Date: 08/12/2022 Start Time: 1300 End Time: 1400   Type of Therapy and Topic:  Group Therapy - How To Cope with Nervousness about Discharge   Participation Level:  Did Not Attend   Description of Group This process group involved identification of patients' feelings about discharge. Some of them are scheduled to be discharged soon, while others are new admissions, but each of them was asked to share thoughts and feelings surrounding discharge from the hospital. One common theme was that they are excited at the prospect of going home, while another was that many of them are apprehensive about sharing why they were hospitalized. Patients were given the opportunity to discuss these feelings with their peers in preparation for discharge.  Therapeutic Goals  Patient will identify their overall feelings about pending discharge. Patient will think about how they might proactively address issues that they believe will once again arise once they get home (i.e. with parents). Patients will participate in discussion about having hope for change.   Summary of Patient Progress:   Patient did not attend group despite encouraged participation.   Therapeutic Modalities Cognitive Behavioral Therapy   Larose Kells 08/12/2022  3:16 PM

## 2022-08-12 NOTE — TOC Transition Note (Signed)
Transition of Care [facility]) - CM/SW Discharge Note   Patient Details  Name: Susan [name] MRN: [id number] Date of Birth: 03-Apr-1996  Transition of Care [facility]) CM/SW Contact:  Raina Mina, [place] Phone Number: 08/12/2022, 10:38 AM   Clinical Narrative:  CSW spoke with patient at bedside in regards to her and the babies positive drug screen. Patient did admit to using marijuana, cocaine, and meth. Patient stated she messed up this time. Patient states she does remember talking to the social worker in January about using drugs. Patient stated she has been to rehab before. CSW asked patient what she named her daughter. Patient stated she named her [name]. Patient paused and was not able to give a correct spelling. Patient told CSW it sounds like Harmony but its not. CSW asked patient where she is currently living. Patient states she lives with her sister Susan [name]. Patient stated that her sister got locked up today and CPS took all the kids. CSW asked patient if she has SSI or any income and she stated no. Patient also stated she has no crib, bassinet, clothes, diapers, wipes, carseat for the baby. Patient stated she is starting out fresh. Patient stated this is her fourth baby. Patient states that that one baby passed away in 09-09-2013 "maybe" from SIDS. Patient states she has 2 daughter [name] and [name] who resides with grandparents Susan [name] and Susan [name]. Patient denies any contact information for them. Patient also slated she didn't receive much prenatal care with the baby.   Patient denies any mental health history or taking any medications. Patient stated she has no friends or family who can help her with her baby. Patient stated that her sister told her to go to the [employer] in [place] with the baby. Patient stated that Kirby Crigler and her mother Susan [name] are supposed to be coming to the hospital later. Patients states that [name] is her nieces godmother. Patient stated they told  her they might be able to buy items for the baby and help her take care of her. Patient had no contact information for them.    CSW informed patient that she will be calling CPS in regards to the positive drug screens and not having any items to support her child. Patient voiced understanding.         Patient Goals and CMS Choice      Discharge Placement                         Discharge Plan and Services Additional resources added to the After Visit Summary for                                       Social Determinants of Health (SDOH) Interventions SDOH Screenings   Food Insecurity: No Food Insecurity (08/11/2022)  Housing: Low Risk  (08/11/2022)  Transportation Needs: No Transportation Needs (08/11/2022)  Utilities: Not At Risk (08/11/2022)  Alcohol Screen: Low Risk  (08/11/2022)  Tobacco Use: High Risk (08/11/2022)     Readmission Risk Interventions     No data to display

## 2022-08-12 NOTE — Group Note (Signed)
Recreation Therapy Group Note   Group Topic:Coping Skills  Group Date: 08/12/2022 Start Time: 1000 End Time: 1040 Facilitators: Vilma Prader, LRT, CTRS Location:  Dayroom  Group Description: Mind Map.  Patient was provided a blank template of a diagram with 32 blank boxes in a tiered system, branching from the center (similar to a bubble chart). LRT directed patients to label the middle of the diagram "Coping Skills". LRT and patients then came up with 8 different coping skills as examples. Pt were directed to record their coping skills in the 2nd tier boxes closest to the center.  Patients would then share their coping skills with the group as LRT wrote them out. LRT gave a handout of 100 different coping skills at the end of group.   Affect/Mood: N/A   Participation Level: Did not attend    Clinical Observations/Individualized Feedback: Susan [name] did not attend group despite encouragement from LRT. Pt was preoccupied with wanting to see her daughter upstairs.   Plan: Continue to engage patient in RT group sessions 2-3x/week.   Vilma Prader, LRT, CTRS 08/12/2022 11:16 AM

## 2022-08-12 NOTE — Progress Notes (Signed)
Pt denies SI/HI/AVH and verbally agrees to approach staff if these become apparent or before harming themselves/others. Rates depression 10/10. Rates anxiety 10/10. Rates pain 0/10. Pt was crying this am stating she wants to see her baby and why won't we let her see her baby. Pt has been in her bed for most of the day and stated that she wanted rehab. Scheduled medications administered to pt, per MD orders. RN provided support and encouragement to pt. Q15 min safety checks implemented and continued. Pt is safe on the unit. Plan of care on going and no other concerns expressed at this time.  08/12/22 1015  Psych Admission Type (Psych Patients Only)  Admission Status Involuntary  Psychosocial Assessment  Patient Complaints Anxiety;Depression;Crying spells  Eye Contact Brief  Facial Expression Anxious;Sullen  Affect Anxious;Sullen;Preoccupied  Speech Logical/coherent  Interaction Assertive  Motor Activity Slow  Appearance/Hygiene Disheveled;Poor Teacher, adult education Cooperative;Appropriate to situation;Anxious  Mood Depressed;Anxious;Preoccupied;Sad  Aggressive Behavior  Effect No apparent injury  Thought Process  Coherency WDL  Content Preoccupation  Delusions None reported or observed  Perception WDL  Hallucination None reported or observed  Judgment Impaired  Confusion None  Danger to Self  Current suicidal ideation? Denies  Danger to Others  Danger to Others None reported or observed

## 2022-08-12 NOTE — Plan of Care (Signed)
  Problem: Education: Goal: Knowledge of General Education information will improve Description: Including pain rating scale, medication(s)/side effects and non-pharmacologic comfort measures Outcome: Progressing   Problem: Nutrition: Goal: Adequate nutrition will be maintained Outcome: Progressing   Problem: Education: Goal: Knowledge of [place] General Education information/materials will improve Outcome: Progressing   Problem: Coping: Goal: Level of anxiety will decrease Outcome: Not Progressing   Problem: Education: Goal: Emotional status will improve Outcome: Not Progressing Goal: Mental status will improve Outcome: Not Progressing
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
## 2022-08-12 NOTE — BHH Suicide Risk Assessment (Signed)
[facility] Admission Suicide Risk Assessment   Nursing information obtained from:  Patient Demographic factors:  NA Current Mental Status:  NA Loss Factors:  NA Historical Factors:  NA Risk Reduction Factors:  NA  Total Time spent with patient: 45 minutes Principal Problem: Adjustment disorder with mixed disturbance of emotions and conduct Diagnosis:  Principal Problem:   Adjustment disorder with mixed disturbance of emotions and conduct Active Problems:   Amphetamine abuse   Cocaine abuse   Cannabis abuse  Subjective Data: Follow-up 27 year old woman who became acutely agitated yesterday when her child was taken into DSS custody.  Patient made suicidal statements yesterday.  Today she denies any suicidal thoughts.  Denies psychotic symptoms.  Remains very anxious and focused on unrealistically accelerated plans of regaining contact with her child.  No acute behavior problems.  Continued Clinical Symptoms:  Alcohol Use Disorder Identification Test Final Score (AUDIT): 0 The "Alcohol Use Disorders Identification Test", Guidelines for Use in Primary Care, Second Edition.  World Pharmacologist [facility]). Score between 0-7:  no or low risk or alcohol related problems. Score between 8-15:  moderate risk of alcohol related problems. Score between 16-19:  high risk of alcohol related problems. Score 20 or above:  warrants further diagnostic evaluation for alcohol dependence and treatment.   CLINICAL FACTORS:   Severe Anxiety and/or Agitation Alcohol/Substance Abuse/Dependencies   Musculoskeletal: Strength & Muscle Tone: within normal limits Gait & Station: normal Patient leans: N/A  Psychiatric Specialty Exam:  Presentation  General Appearance: No data recorded Eye Contact:No data recorded Speech:No data recorded Speech Volume:No data recorded Handedness:No data recorded  Mood and Affect  Mood:No data recorded Affect:No data recorded  Thought Process  Thought Processes:No  data recorded Descriptions of Associations:No data recorded Orientation:No data recorded Thought Content:No data recorded History of Schizophrenia/Schizoaffective disorder:No data recorded Duration of Psychotic Symptoms:No data recorded Hallucinations:No data recorded Ideas of Reference:No data recorded Suicidal Thoughts:No data recorded Homicidal Thoughts:No data recorded  Sensorium  Memory:No data recorded Judgment:No data recorded Insight:No data recorded  Executive Functions  Concentration:No data recorded Attention Span:No data recorded Recall:No data recorded Fund of Knowledge:No data recorded Language:No data recorded  Psychomotor Activity  Psychomotor Activity:No data recorded  Assets  Assets:No data recorded  Sleep  Sleep:No data recorded   Physical Exam: Physical Exam Vitals reviewed.  Constitutional:      Appearance: Normal appearance.  HENT:     Head: Normocephalic and atraumatic.     Mouth/Throat:     Pharynx: Oropharynx is clear.  Eyes:     Pupils: Pupils are equal, round, and reactive to light.  Cardiovascular:     Rate and Rhythm: Normal rate and regular rhythm.  Pulmonary:     Effort: Pulmonary effort is normal.     Breath sounds: Normal breath sounds.  Abdominal:     General: Abdomen is flat.     Palpations: Abdomen is soft.  Musculoskeletal:        General: Normal range of motion.  Skin:    General: Skin is warm and dry.  Neurological:     General: No focal deficit present.     Mental Status: She is alert. Mental status is at baseline.  Psychiatric:        Attention and Perception: She is inattentive.        Mood and Affect: Mood is anxious.        Speech: Speech normal.        Behavior: Behavior normal.  Thought Content: Thought content normal.        Cognition and Memory: Memory is impaired.    Review of Systems  Constitutional: Negative.   HENT: Negative.    Eyes: Negative.   Respiratory: Negative.    Cardiovascular:  Negative.   Gastrointestinal: Negative.   Musculoskeletal: Negative.   Skin: Negative.   Neurological: Negative.   Psychiatric/Behavioral:  Positive for substance abuse. Negative for depression, hallucinations, memory loss and suicidal ideas. The patient is nervous/anxious. The patient does not have insomnia.    Blood pressure (!) 157/99, pulse (!) 101, temperature 98.3 F (36.8 C), temperature source Oral, resp. rate 20, height 5\' 5"  (1.651 m), weight 93 kg, SpO2 99 %. Body mass index is 34.12 kg/m.   COGNITIVE FEATURES THAT CONTRIBUTE TO RISK:  Thought constriction (tunnel vision)    SUICIDE RISK:   Minimal: No identifiable suicidal ideation.  Patients presenting with no risk factors but with morbid ruminations; may be classified as minimal risk based on the severity of the depressive symptoms  PLAN OF CARE: Continue 15-minute checks.  No clear indication right now for specific psychiatric medicine.  Treatment team will talk with her tomorrow.  Look into possible options for substance use treatment  I certify that inpatient services furnished can reasonably be expected to improve the patient's condition.   Alethia Berthold, MD 08/12/2022, 4:19 PM

## 2022-08-13 DIAGNOSIS — F4325 Adjustment disorder with mixed disturbance of emotions and conduct: Secondary | ICD-10-CM | POA: Diagnosis not present

## 2022-08-13 LAB — RPR: RPR Ser Ql: NONREACTIVE

## 2022-08-13 MED ORDER — NICOTINE 14 MG/24HR TD PT24: 14.0000 mg | MEDICATED_PATCH | Freq: Every day | TRANSDERMAL | 0 refills | Status: AC

## 2022-08-13 MED ORDER — NIFEDIPINE ER 60 MG PO TB24: 60.0000 mg | ORAL_TABLET | Freq: Every day | ORAL | 0 refills | Status: AC

## 2022-08-13 NOTE — BHH Suicide Risk Assessment (Signed)
[facility] Discharge Suicide Risk Assessment   Principal Problem: Adjustment disorder with mixed disturbance of emotions and conduct Discharge Diagnoses: Principal Problem:   Adjustment disorder with mixed disturbance of emotions and conduct Active Problems:   Amphetamine abuse   Cocaine abuse   Cannabis abuse   Total Time spent with patient: 30 minutes  Musculoskeletal: Strength & Muscle Tone: within normal limits Gait & Station: normal Patient leans: N/A  Psychiatric Specialty Exam  Presentation  General Appearance: No data recorded Eye Contact:No data recorded Speech:No data recorded Speech Volume:No data recorded Handedness:No data recorded  Mood and Affect  Mood:No data recorded Duration of Depression Symptoms: No data recorded Affect:No data recorded  Thought Process  Thought Processes:No data recorded Descriptions of Associations:No data recorded Orientation:No data recorded Thought Content:No data recorded History of Schizophrenia/Schizoaffective disorder:No data recorded Duration of Psychotic Symptoms:No data recorded Hallucinations:No data recorded Ideas of Reference:No data recorded Suicidal Thoughts:No data recorded Homicidal Thoughts:No data recorded  Sensorium  Memory:No data recorded Judgment:No data recorded Insight:No data recorded  Executive Functions  Concentration:No data recorded Attention Span:No data recorded Recall:No data recorded Fund of Knowledge:No data recorded Language:No data recorded  Psychomotor Activity  Psychomotor Activity:No data recorded  Assets  Assets:No data recorded  Sleep  Sleep:No data recorded  Physical Exam: Physical Exam Vitals and nursing note reviewed.  Constitutional:      Appearance: Normal appearance.  HENT:     Head: Normocephalic and atraumatic.     Mouth/Throat:     Pharynx: Oropharynx is clear.  Eyes:     Pupils: Pupils are equal, round, and reactive to light.  Cardiovascular:     Rate and  Rhythm: Normal rate and regular rhythm.  Pulmonary:     Effort: Pulmonary effort is normal.     Breath sounds: Normal breath sounds.  Abdominal:     General: Abdomen is flat.     Palpations: Abdomen is soft.  Musculoskeletal:        General: Normal range of motion.  Skin:    General: Skin is warm and dry.  Neurological:     General: No focal deficit present.     Mental Status: She is alert. Mental status is at baseline.  Psychiatric:        Attention and Perception: Attention normal.        Mood and Affect: Mood is anxious.        Speech: Speech normal.        Behavior: Behavior is cooperative.        Thought Content: Thought content normal.        Cognition and Memory: Cognition normal.        Judgment: Judgment normal.    Review of Systems  Constitutional: Negative.   HENT: Negative.    Eyes: Negative.   Respiratory: Negative.    Cardiovascular: Negative.   Gastrointestinal: Negative.   Musculoskeletal: Negative.   Skin: Negative.   Neurological: Negative.   Psychiatric/Behavioral: Negative.  Negative for suicidal ideas.    Blood pressure (!) 151/94, pulse (!) 111, temperature 98.6 F (37 C), temperature source Oral, resp. rate 18, height 5\' 5"  (1.651 m), weight 93 kg, SpO2 98 %. Body mass index is 34.12 kg/m.  Mental Status Per Nursing Assessment::   On Admission:  NA  Demographic Factors:  Low socioeconomic status  Loss Factors: Loss of significant relationship  Historical Factors: NA  Risk Reduction Factors:   Positive social support  Continued Clinical Symptoms:  Severe Anxiety and/or Agitation Alcohol/Substance  Abuse/Dependencies  Cognitive Features That Contribute To Risk:  None    Suicide Risk:  Minimal: No identifiable suicidal ideation.  Patients presenting with no risk factors but with morbid ruminations; may be classified as minimal risk based on the severity of the depressive symptoms   Follow-up Information     [facility]  Follow up.   Why: Your appointment is scheduled for Friday, 08/15/22 at [place]. Thanks! Contact information: [place] [address] [phone]                 Plan Of Care/Follow-up recommendations:  Other:  Patient has not shown any dangerous behavior in the hospital and consistently denies suicidal ideation.  Patient agrees to plan for referral to outpatient substance abuse treatment.  No longer meets commitment criteria.  Agrees to outpatient treatment.  Appears to be stabilized.  Discontinue IVC discharge patient today recommend follow-up [facility].  Alethia Berthold, MD 08/13/2022, 10:40 AM

## 2022-08-13 NOTE — Group Note (Signed)
Date:  08/13/2022 Time:  12:21 AM  Group Topic/Focus:  Wrap-Up Group:   The focus of this group is to help patients review their daily goal of treatment and discuss progress on daily workbooks.    Participation Level:  Active  Participation Quality:  Appropriate and Attentive  Affect:  Anxious, Appropriate, and Excited  Cognitive:  Alert and Appropriate  Insight: Appropriate and Good  Engagement in Group:  Engaged and Supportive  Modes of Intervention:  Discussion and Support  Additional Comments:     Susan [name] 08/13/2022, 12:21 AM

## 2022-08-13 NOTE — Group Note (Signed)
Recreation Therapy Group Note   Group Topic:Problem Solving  Group Date: 08/13/2022 Start Time: 1000 End Time: 1100 Facilitators: Vilma Prader, LRT, CTRS Location:  Craft Room  Group Description: Life Boat. Patients were given the scenario that they are on a boat that is about to become shipwrecked, leaving them stranded on an [place]. They are asked to make a list of 15 different items that they want to take with them when they are stranded on the [place]. Patients are asked to rank their items from most important to least important, #1 being the most important and #15 being the least. Patients will work individually for the first round to come up with 15 items and then pair up with a peer(s) to condense their list and come up with one list of 15 items between the two of them. Patients or LRT will read aloud the 15 different items to the group after each round. LRT facilitated post-activity processing to discuss how this activity can be used in daily life post discharge.   Affect/Mood: N/A   Participation Level: Did not attend    Clinical Observations/Individualized Feedback: Susan [name] did not attend group due to preparing for discharge.   Plan: Continue to engage patient in RT group sessions 2-3x/week.   Vilma Prader, LRT, CTRS 08/13/2022 11:14 AM

## 2022-08-13 NOTE — Progress Notes (Signed)
Pt complains of severe breast tenderness in bilateral upper breasts.  6 AM dose of Ibuprofen given as scheduled.

## 2022-08-13 NOTE — BHH Suicide Risk Assessment (Signed)
[facility] INPATIENT:  Family/Significant Other Suicide Prevention Education  Suicide Prevention Education:  Patient Refusal for Family/Significant Other Suicide Prevention Education: The patient Susan [name] has refused to provide written consent for family/significant other to be provided Family/Significant Other Suicide Prevention Education during admission and/or prior to discharge.  Physician notified.  SPE completed with pt, as pt refused to consent to family contact. SPI pamphlet provided to pt and pt was encouraged to share information with support network, ask questions, and talk about any concerns relating to SPE. Pt denies access to guns/firearms and verbalized understanding of information provided. Mobile Crisis information also provided to pt.  Shirl Harris 08/13/2022, 10:17 AM

## 2022-08-13 NOTE — BH IP Treatment Plan (Signed)
Interdisciplinary Treatment and Diagnostic Plan Update  08/13/2022 Time of Session: 09:25 Susan [name] MRN: [id number]  Principal Diagnosis: Adjustment disorder with mixed disturbance of emotions and conduct  Secondary Diagnoses: Principal Problem:   Adjustment disorder with mixed disturbance of emotions and conduct Active Problems:   Amphetamine abuse   Cocaine abuse   Cannabis abuse   Current Medications:  Current Facility-Administered Medications  Medication Dose Route Frequency Provider Last Rate Last Admin   acetaminophen (TYLENOL) tablet 650 mg  650 mg Oral Q6H PRN Clapacs, Madie Reno, MD   650 mg at 08/13/22 0746   alum & mag hydroxide-simeth (MAALOX/MYLANTA) 200-200-20 MG/5ML suspension 30 mL  30 mL Oral Q4H PRN Clapacs, John T, MD       coconut oil  1 Application Topical PRN Clapacs, Madie Reno, MD       witch hazel-glycerin (TUCKS) pad 1 Application  1 Application Topical PRN Clapacs, Madie Reno, MD       And   dibucaine (NUPERCAINAL) 1 % rectal ointment 1 Application  1 Application Rectal PRN Clapacs, John T, MD       haloperidol (HALDOL) tablet 5 mg  5 mg Oral Q6H PRN Clapacs, Madie Reno, MD   5 mg at 08/12/22 2156   Or   haloperidol lactate (HALDOL) injection 5 mg  5 mg Intramuscular Q6H PRN Clapacs, Madie Reno, MD       hydrOXYzine (ATARAX) tablet 50 mg  50 mg Oral Q6H PRN Clapacs, Madie Reno, MD   50 mg at 08/12/22 2156   ibuprofen (ADVIL) tablet 600 mg  600 mg Oral Q6H Clapacs, John T, MD   600 mg at 08/13/22 [id number]   loratadine (CLARITIN) tablet 10 mg  10 mg Oral Daily Clapacs, John T, MD   10 mg at 08/13/22 0743   magnesium hydroxide (MILK OF MAGNESIA) suspension 30 mL  30 mL Oral Daily PRN Clapacs, Madie Reno, MD       nicotine (NICODERM CQ - dosed in mg/24 hours) patch 14 mg  14 mg Transdermal Daily Clapacs, John T, MD   14 mg at 08/13/22 0745   nicotine polacrilex (NICORETTE) gum 2 mg  2 mg Oral PRN Clapacs, Madie Reno, MD       NIFEdipine (ADALAT CC) 24 hr tablet 60 mg  60 mg Oral Daily Lucrezia Europe Renee, CNM   60 mg at 08/13/22 [id number]   risperiDONE (RISPERDAL M-TABS) disintegrating tablet 2 mg  2 mg Oral Q8H PRN Clapacs, John T, MD       And   ziprasidone (GEODON) injection 20 mg  20 mg Intramuscular PRN Clapacs, Madie Reno, MD       senna-docusate (Senokot-S) tablet 2 tablet  2 tablet Oral Q24H Clapacs, Madie Reno, MD       simethicone (MYLICON) chewable tablet 80 mg  80 mg Oral PRN Clapacs, Madie Reno, MD   80 mg at 08/11/22 2245   sodium phosphate (FLEET) 7-19 GM/118ML enema 1 enema  1 enema Rectal Daily PRN Clapacs, Madie Reno, MD       PTA Medications: No medications prior to admission.    Patient Stressors: Financial difficulties   Medication change or noncompliance   Substance abuse    Patient Strengths: Motivation for treatment/growth  Supportive family/friends   Treatment Modalities: Medication Management, Group therapy, Case management,  1 to 1 session with clinician, Psychoeducation, Recreational therapy.   Physician Treatment Plan for Primary Diagnosis: Adjustment disorder with mixed disturbance of emotions and conduct Long Term  Goal(s): Improvement in symptoms so as ready for discharge   Short Term Goals: Ability to maintain clinical measurements within normal limits will improve Ability to verbalize feelings will improve Ability to disclose and discuss suicidal ideas  Medication Management: Evaluate patient's response, side effects, and tolerance of medication regimen.  Therapeutic Interventions: 1 to 1 sessions, Unit Group sessions and Medication administration.  Evaluation of Outcomes: Adequate for Discharge  Physician Treatment Plan for Secondary Diagnosis: Principal Problem:   Adjustment disorder with mixed disturbance of emotions and conduct Active Problems:   Amphetamine abuse   Cocaine abuse   Cannabis abuse  Long Term Goal(s): Improvement in symptoms so as ready for discharge   Short Term Goals: Ability to maintain clinical measurements within normal  limits will improve Ability to verbalize feelings will improve Ability to disclose and discuss suicidal ideas     Medication Management: Evaluate patient's response, side effects, and tolerance of medication regimen.  Therapeutic Interventions: 1 to 1 sessions, Unit Group sessions and Medication administration.  Evaluation of Outcomes: Adequate for Discharge   RN Treatment Plan for Primary Diagnosis: Adjustment disorder with mixed disturbance of emotions and conduct Long Term Goal(s): Knowledge of disease and therapeutic regimen to maintain health will improve  Short Term Goals: Ability to remain free from injury will improve, Ability to verbalize frustration and anger appropriately will improve, Ability to demonstrate self-control, Ability to participate in decision making will improve, Ability to verbalize feelings will improve, Ability to disclose and discuss suicidal ideas, Ability to identify and develop effective coping behaviors will improve, and Compliance with prescribed medications will improve  Medication Management: RN will administer medications as ordered by provider, will assess and evaluate patient's response and provide education to patient for prescribed medication. RN will report any adverse and/or side effects to prescribing provider.  Therapeutic Interventions: 1 on 1 counseling sessions, Psychoeducation, Medication administration, Evaluate responses to treatment, Monitor vital signs and CBGs as ordered, Perform/monitor CIWA, COWS, AIMS and Fall Risk screenings as ordered, Perform wound care treatments as ordered.  Evaluation of Outcomes: Adequate for Discharge   LCSW Treatment Plan for Primary Diagnosis: Adjustment disorder with mixed disturbance of emotions and conduct Long Term Goal(s): Safe transition to appropriate next level of care at discharge, Engage patient in therapeutic group addressing interpersonal concerns.  Short Term Goals: Engage patient in aftercare  planning with referrals and resources, Increase social support, Increase ability to appropriately verbalize feelings, Increase emotional regulation, Facilitate acceptance of mental health diagnosis and concerns, Facilitate patient progression through stages of change regarding substance use diagnoses and concerns, Identify triggers associated with mental health/substance abuse issues, and Increase skills for wellness and recovery  Therapeutic Interventions: Assess for all discharge needs, 1 to 1 time with Social worker, Explore available resources and support systems, Assess for adequacy in community support network, Educate family and significant other(s) on suicide prevention, Complete Psychosocial Assessment, Interpersonal group therapy.  Evaluation of Outcomes: Adequate for Discharge   Progress in Treatment: Attending groups: No. Participating in groups: No. Taking medication as prescribed: Yes. Toleration medication: Yes. Family/Significant other contact made: No, will contact:  if given permission. Patient understands diagnosis: Yes. Discussing patient identified problems/goals with staff: Yes. Medical problems stabilized or resolved: Yes. Denies suicidal/homicidal ideation: Yes. Issues/concerns per patient self-inventory: No. Other: none.  New problem(s) identified: No, Describe:  none identified.  New Short Term/Long Term Goal(s): medication management for mood stabilization; elimination of SI thoughts; development of comprehensive mental wellness/sobriety plan.  Patient Goals:  "My goal  is to get out today and get my daughter situated."  Discharge Plan or Barriers: Pt plans to return to a private residence with outpatient treatment through [facility].   Reason for Continuation of Hospitalization: Medication stabilization Other; describe substance use  Estimated Length of Stay: 1-7 days  Last 3 [place] Suicide Severity Risk Score: Flowsheet Row Admission (Current) from 08/11/2022 in  [facility] ED to Hosp-Admission (Discharged) from 08/10/2022 in [facility] No Risk High Risk       Last PHQ 2/9 Scores:     No data to display          Scribe for Treatment Team: Shirl Harris, LCSW 08/13/2022 10:29 AM

## 2022-08-13 NOTE — Group Note (Signed)
Date:  08/13/2022 Time:  11:27 AM  Group Topic/Focus:  Community Meeting    Participation Level:  Did Not Attend   Adela Lank [facility] 08/13/2022, 11:27 AM

## 2022-08-13 NOTE — Progress Notes (Signed)
  [facility] Adult Case Management Discharge Plan :  Will you be returning to the same living situation after discharge:  No. At discharge, do you have transportation home?: Yes,  pt plans to take a bus to her sister's home. Do you have the ability to pay for your medications: Yes,  [employer].  Release of information consent forms completed and in the chart;  Patient's signature needed at discharge.  Patient to Follow up at:  Follow-up Information     [facility] Follow up.   Why: Your appointment is scheduled for Friday, 08/15/22 at [facility]. Thanks! Contact information: [place] [address] [phone]                 Next level of care provider has access to [facility] and Suicide Prevention discussed: Yes,  SPE completed with pt.     Has patient been referred to the Quitline?: Patient refused referral  Patient has been referred for addiction treatment: Yes  Shirl Harris, LCSW 08/13/2022, 10:16 AM

## 2022-08-13 NOTE — Discharge Summary (Signed)
Physician Discharge Summary Note  Patient:  Susan [name] is an 27 y.o., female MRN:  [id number] DOB:  1996-01-07 Patient phone:  [phone] (home)  Patient address:   [place] [id number],  Total Time spent with patient: 30 minutes  Date of Admission:  08/11/2022 Date of Discharge: 08/13/2022  Reason for Admission: Patient was admitted in transfer from the OB/GYN unit because of agitation and disorganized behavior and statements of suicidal ideation.  Principal Problem: Adjustment disorder with mixed disturbance of emotions and conduct Discharge Diagnoses: Principal Problem:   Adjustment disorder with mixed disturbance of emotions and conduct Active Problems:   Amphetamine abuse   Cocaine abuse   Cannabis abuse   Past Psychiatric History: History of substance abuse  Past Medical History: History reviewed. No pertinent past medical history. History reviewed. No pertinent surgical history. Family History: History reviewed. No pertinent family history. Family Psychiatric  History: See notes previously of substance abuse and family Social History:  Social History   Substance and Sexual Activity  Alcohol Use None     Social History   Substance and Sexual Activity  Drug Use Yes   Types: Marijuana, Cocaine, Methamphetamines    Social History   Socioeconomic History   Marital status: Single    Spouse name: Not on file   Number of children: Not on file   Years of education: Not on file   Highest education level: Not on file  Occupational History   Not on file  Tobacco Use   Smoking status: Every Day    Packs/day: 1    Types: Cigarettes   Smokeless tobacco: Never  Substance and Sexual Activity   Alcohol use: Not on file   Drug use: Yes    Types: Marijuana, Cocaine, Methamphetamines   Sexual activity: Yes  Other Topics Concern   Not on file  Social History Narrative   Not on file   Social Determinants of Health   Financial Resource Strain: Not on  file  Food Insecurity: No Food Insecurity (08/11/2022)   Hunger Vital Sign    Worried About Running Out of Food in the Last Year: Never true    Ran Out of Food in the Last Year: Never true  Transportation Needs: No Transportation Needs (08/11/2022)   PRAPARE - Hydrologist (Medical): No    Lack of Transportation (Non-Medical): No  Physical Activity: Not on file  Stress: Not on file  Social Connections: Not on file    Hospital Course: Admitted to psychiatric unit.  15-minute checks continued.  Did not display any dangerous aggressive violent or suicidal behavior.  Patient was cooperative with treatment during her time in the hospital.  She did not meet criteria for major depression did not appear to require psychiatric medicine.  She was requesting discharge and promised that she would follow-up with outpatient substance abuse treatment.  Counseling was done about the importance of getting into a sustained substance abuse treatment.  Patient agrees to plan.  Physical Findings: AIMS: Facial and Oral Movements Muscles of Facial Expression: None, normal Lips and Perioral Area: None, normal Jaw: None, normal Tongue: None, normal,Extremity Movements Upper (arms, wrists, hands, fingers): None, normal Lower (legs, knees, ankles, toes): None, normal, Trunk Movements Neck, shoulders, hips: None, normal, Overall Severity Severity of abnormal movements (highest score from questions above): None, normal Incapacitation due to abnormal movements: None, normal Patient's awareness of abnormal movements (rate only patient's report): No Awareness, Dental Status Current problems with teeth  and/or dentures?: No Does patient usually wear dentures?: No  CIWA:    COWS:     Musculoskeletal: Strength & Muscle Tone: within normal limits Gait & Station: normal Patient leans: N/A   Psychiatric Specialty Exam:  Presentation  General Appearance: No data recorded Eye Contact:No  data recorded Speech:No data recorded Speech Volume:No data recorded Handedness:No data recorded  Mood and Affect  Mood:No data recorded Affect:No data recorded  Thought Process  Thought Processes:No data recorded Descriptions of Associations:No data recorded Orientation:No data recorded Thought Content:No data recorded History of Schizophrenia/Schizoaffective disorder:No data recorded Duration of Psychotic Symptoms:No data recorded Hallucinations:No data recorded Ideas of Reference:No data recorded Suicidal Thoughts:No data recorded Homicidal Thoughts:No data recorded  Sensorium  Memory:No data recorded Judgment:No data recorded Insight:No data recorded  Executive Functions  Concentration:No data recorded Attention Span:No data recorded Recall:No data recorded Fund of Knowledge:No data recorded Language:No data recorded  Psychomotor Activity  Psychomotor Activity:No data recorded  Assets  Assets:No data recorded  Sleep  Sleep:No data recorded   Physical Exam: Physical Exam Vitals reviewed.  Constitutional:      Appearance: Normal appearance.  HENT:     Head: Normocephalic and atraumatic.     Mouth/Throat:     Pharynx: Oropharynx is clear.  Eyes:     Pupils: Pupils are equal, round, and reactive to light.  Cardiovascular:     Rate and Rhythm: Normal rate and regular rhythm.  Pulmonary:     Effort: Pulmonary effort is normal.     Breath sounds: Normal breath sounds.  Abdominal:     General: Abdomen is flat.     Palpations: Abdomen is soft.  Musculoskeletal:        General: Normal range of motion.  Skin:    General: Skin is warm and dry.  Neurological:     General: No focal deficit present.     Mental Status: She is alert. Mental status is at baseline.  Psychiatric:        Attention and Perception: Attention normal.        Mood and Affect: Mood normal.        Speech: Speech normal.        Behavior: Behavior normal.        Thought Content:  Thought content normal.        Cognition and Memory: Cognition normal.        Judgment: Judgment normal.    Review of Systems  Constitutional: Negative.   HENT: Negative.    Eyes: Negative.   Respiratory: Negative.    Cardiovascular: Negative.   Gastrointestinal: Negative.   Musculoskeletal: Negative.   Skin: Negative.   Neurological: Negative.   Psychiatric/Behavioral: Negative.     Blood pressure (!) 151/94, pulse (!) 111, temperature 98.6 F (37 C), temperature source Oral, resp. rate 18, height 5\' 5"  (1.651 m), weight 93 kg, SpO2 98 %. Body mass index is 34.12 kg/m.   Social History   Tobacco Use  Smoking Status Every Day   Packs/day: 1   Types: Cigarettes  Smokeless Tobacco Never   Tobacco Cessation:  A prescription for an FDA-approved tobacco cessation medication provided at discharge   Blood Alcohol level:  No results found for: "ETH"  Metabolic Disorder Labs:  No results found for: "HGBA1C", "MPG" No results found for: "PROLACTIN" No results found for: "CHOL", "TRIG", "HDL", "CHOLHDL", "VLDL", "[place]"  See Psychiatric Specialty Exam and Suicide Risk Assessment completed by Attending Physician prior to discharge.  Discharge destination:  Home  Is patient on multiple antipsychotic therapies at discharge:  No   Has Patient had three or more failed trials of antipsychotic monotherapy by history:  No  Recommended Plan for Multiple Antipsychotic Therapies: NA  Discharge Instructions     Diet - low sodium heart healthy   Complete by: As directed    Increase activity slowly   Complete by: As directed       Allergies as of 08/13/2022   Not on File      Medication List     TAKE these medications      Indication  nicotine 14 mg/24hr patch Commonly known as: NICODERM CQ - dosed in mg/24 hours Place 1 patch (14 mg total) onto the skin daily. Start taking on: August 14, 2022  Indication: Nicotine Addiction   NIFEdipine 60 MG 24 hr tablet Commonly  known as: ADALAT CC Take 1 tablet (60 mg total) by mouth daily. Start taking on: August 14, 2022  Indication: High Blood Pressure Disorder        Follow-up Information     [facility] Follow up.   Why: Your appointment is scheduled for Friday, 08/15/22 at [facility]. Thanks! Contact information: [place] [address] [phone]                 Follow-up recommendations:  Other:  Follow-up with outpatient at [facility]  Comments: Follow-up outpatient [facility]  Signed: Alethia Berthold, MD 08/13/2022, 10:44 AM

## 2022-08-13 NOTE — Group Note (Signed)
Date:  08/13/2022 Time:  12:18 AM  Group Topic/Focus:  Wrap-Up Group:   The focus of this group is to help patients review their daily goal of treatment and discuss progress on daily workbooks.    Participation Level:  Active  Participation Quality:  Appropriate and Attentive  Affect:  Anxious, Appropriate, and Excited  Cognitive:  Alert and Appropriate  Insight: Appropriate and Good  Engagement in Group:  Engaged  Modes of Intervention:  Discussion and Support  Additional Comments:   Waned to be discharged to go see her baby  Jennye Boroughs 08/13/2022, 12:18 AM

## 2022-08-13 NOTE — Progress Notes (Signed)
Discharge note: Suicide safety plan completed. and survey refused. RN met with pt and reviewed pt's discharge instructions. Pt verbalized understanding of discharge instructions and pt did not have any questions. RN reviewed and provided pt with a copy of SRA, AVS and Transition Record. RN returned pt's belongings to pt. Prescriptions were given to pt. Pt denied SI/HI/AVH and voiced no concerns. Pt was appreciative of the care pt received at [facility]. Patient discharged to the courtesy car without incident.  08/13/22 0746  Psych Admission Type (Psych Patients Only)  Admission Status Involuntary  Psychosocial Assessment  Patient Complaints Anxiety;Depression  Eye Contact Brief  Facial Expression Anxious  Affect Anxious;Preoccupied  Speech Logical/coherent;Soft  Interaction Assertive  Motor Activity Slow  Appearance/Hygiene Disheveled  Behavior Characteristics Cooperative;Appropriate to situation;Anxious  Mood Anxious;Depressed  Aggressive Behavior  Effect No apparent injury  Thought Process  Coherency WDL  Content Preoccupation  Delusions None reported or observed  Perception WDL  Hallucination None reported or observed  Judgment Impaired  Confusion None  Danger to Self  Current suicidal ideation? Denies  Danger to Others  Danger to Others None reported or observed

## 2022-08-13 NOTE — Progress Notes (Signed)
Pt denies SI Hi AVH.  Pt is preoccupied with being discharged and/or getting permission to see her baby in the NICU this morning. Pt is aware that she would need to talk to the MD to discuss this.  She requested that she not be awakened to take her scheduled ibuprofen at midnight because "I just want to sleep so the day comes faster."  Pt indicates she needs to be released before "the social worker takes my child."  Pt had no other complaints.  Continued monitoring for safety.  08/13/22 0300  Psych Admission Type (Psych Patients Only)  Admission Status Involuntary  Psychosocial Assessment  Patient Complaints Anxiety  Eye Contact Brief  Facial Expression Anxious  Affect Anxious;Preoccupied  Speech Logical/coherent  Interaction Assertive  Motor Activity Slow  Appearance/Hygiene Disheveled  Behavior Characteristics Cooperative  Mood Depressed;Anxious  Thought Process  Coherency WDL  Content Preoccupation  Delusions None reported or observed  Perception WDL  Hallucination None reported or observed  Judgment Impaired  Confusion None  Danger to Self  Current suicidal ideation? Denies  Agreement Not to Harm Self Yes  Description of Agreement verbal  Danger to Others  Danger to Others None reported or observed

## 2022-08-13 NOTE — Plan of Care (Signed)
  Problem: Education: Goal: Knowledge of General Education information will improve Description: Including pain rating scale, medication(s)/side effects and non-pharmacologic comfort measures Outcome: Progressing   Problem: Nutrition: Goal: Adequate nutrition will be maintained Outcome: Progressing   Problem: Coping: Goal: Level of anxiety will decrease Outcome: Progressing   Problem: Education: Goal: Emotional status will improve Outcome: Progressing Goal: Mental status will improve Outcome: Progressing

## 2022-08-16 ENCOUNTER — Encounter: Payer: Self-pay | Admitting: Obstetrics and Gynecology

## 2022-08-18 ENCOUNTER — Telehealth: Payer: Self-pay

## 2022-08-18 NOTE — Telephone Encounter (Signed)
[facility]- Discharge Call Backs 1-Do you have any questions or concerns about yourself as you heal? No 2-How was your stay at the hospital?Good 2-How did our team work together to care for you?Good You should be receiving a survey in the mail soon.   We would really appreciate it if you could fill that out for [place] and return it in the mail.  We value the feedback to make improvements and continue the great work we do.   If you have any questions please feel free to call me back at [phone]

## 2022-09-09 ENCOUNTER — Other Ambulatory Visit: Payer: Self-pay

## 2022-09-09 ENCOUNTER — Emergency Department
Admission: EM | Admit: 2022-09-09 | Discharge: 2022-09-09 | Disposition: A | Discharge status: Home / Self Care | Payer: Medicaid Other | Attending: Emergency Medicine | Admitting: Emergency Medicine

## 2022-09-09 ENCOUNTER — Emergency Department: Payer: Medicaid Other

## 2022-09-09 DIAGNOSIS — R4182 Altered mental status, unspecified: Secondary | ICD-10-CM | POA: Insufficient documentation

## 2022-09-09 LAB — URINALYSIS, ROUTINE W REFLEX MICROSCOPIC
Bilirubin Urine: NEGATIVE
Glucose, UA: NEGATIVE mg/dL
Ketones, ur: NEGATIVE mg/dL
Nitrite: NEGATIVE
Protein, ur: 30 mg/dL — AB
Specific Gravity, Urine: 1.028 (ref 1.005–1.030)
pH: 5 (ref 5.0–8.0)

## 2022-09-09 LAB — CBC WITH DIFFERENTIAL/PLATELET
Abs Immature Granulocytes: 0.04 10*3/uL (ref 0.00–0.07)
Basophils Absolute: 0.1 10*3/uL (ref 0.0–0.1)
Basophils Relative: 1 %
Eosinophils Absolute: 0.4 10*3/uL (ref 0.0–0.5)
Eosinophils Relative: 3 %
HCT: 40.9 % (ref 36.0–46.0)
Hemoglobin: 12.9 g/dL (ref 12.0–15.0)
Immature Granulocytes: 0 %
Lymphocytes Relative: 29 %
Lymphs Abs: 3.5 10*3/uL (ref 0.7–4.0)
MCH: 27.9 pg (ref 26.0–34.0)
MCHC: 31.5 g/dL (ref 30.0–36.0)
MCV: 88.5 fL (ref 80.0–100.0)
Monocytes Absolute: 1 10*3/uL (ref 0.1–1.0)
Monocytes Relative: 8 %
Neutro Abs: 7.1 10*3/uL (ref 1.7–7.7)
Neutrophils Relative %: 59 %
Platelets: 292 10*3/uL (ref 150–400)
RBC: 4.62 MIL/uL (ref 3.87–5.11)
RDW: 16.4 % — ABNORMAL HIGH (ref 11.5–15.5)
WBC: 12 10*3/uL — ABNORMAL HIGH (ref 4.0–10.5)
nRBC: 0 % (ref 0.0–0.2)

## 2022-09-09 LAB — COMPREHENSIVE METABOLIC PANEL
ALT: 23 U/L (ref 0–44)
AST: 36 U/L (ref 15–41)
Albumin: 4 g/dL (ref 3.5–5.0)
Alkaline Phosphatase: 128 U/L — ABNORMAL HIGH (ref 38–126)
Anion gap: 10 (ref 5–15)
BUN: 18 mg/dL (ref 6–20)
CO2: 23 mmol/L (ref 22–32)
Calcium: 9.4 mg/dL (ref 8.9–10.3)
Chloride: 102 mmol/L (ref 98–111)
Creatinine, Ser: 0.74 mg/dL (ref 0.44–1.00)
GFR, Estimated: >60 mL/min (ref >60)
Glucose, Bld: 92 mg/dL (ref 70–99)
Potassium: 3.7 mmol/L (ref 3.5–5.1)
Sodium: 135 mmol/L (ref 135–145)
Total Bilirubin: 0.8 mg/dL (ref 0.3–1.2)
Total Protein: 7.5 g/dL (ref 6.5–8.1)

## 2022-09-09 LAB — URINE DRUG SCREEN, QUALITATIVE (ARMC ONLY)
Amphetamines, Ur Screen: POSITIVE — AB
Barbiturates, Ur Screen: NOT DETECTED
Benzodiazepine, Ur Scrn: NOT DETECTED
Cannabinoid 50 Ng, Ur ~~LOC~~: POSITIVE — AB
Cocaine Metabolite,Ur ~~LOC~~: POSITIVE — AB
MDMA (Ecstasy)Ur Screen: NOT DETECTED
Methadone Scn, Ur: NOT DETECTED
Opiate, Ur Screen: NOT DETECTED
Phencyclidine (PCP) Ur S: NOT DETECTED
Tricyclic, Ur Screen: NOT DETECTED

## 2022-09-09 LAB — PREGNANCY, URINE: Preg Test, Ur: NEGATIVE

## 2022-09-09 LAB — ETHANOL: Alcohol, Ethyl (B): <10 mg/dL (ref <10)

## 2022-09-09 NOTE — ED Notes (Signed)
Pt asked if she would like family notified about todays visit. Pt yelled 'NO".
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
## 2022-09-09 NOTE — ED Notes (Signed)
Pt found walking down hallway and told to return to room for DC papers.
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
## 2022-09-09 NOTE — ED Notes (Signed)
Pt left prior to full set of DC vitals. Pt was A&Ox4 and able to ambulate with steady gait prior to eloping.

## 2022-09-09 NOTE — ED Notes (Signed)
RN called pts family member back. Misty Stanley). No answer. Voicemail full.
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
## 2022-09-18 ENCOUNTER — Encounter: Payer: Self-pay | Admitting: Family Medicine

## 2022-09-18 ENCOUNTER — Ambulatory Visit: Payer: Medicaid Other | Admitting: Family Medicine

## 2022-09-18 VITALS — BP 118/81 | Wt 175.2 lb

## 2022-09-18 DIAGNOSIS — Z113 Encounter for screening for infections with a predominantly sexual mode of transmission: Secondary | ICD-10-CM

## 2022-09-18 DIAGNOSIS — B9689 Other specified bacterial agents as the cause of diseases classified elsewhere: Secondary | ICD-10-CM

## 2022-09-18 DIAGNOSIS — N760 Acute vaginitis: Secondary | ICD-10-CM | POA: Diagnosis not present

## 2022-09-18 DIAGNOSIS — Z01419 Encounter for gynecological examination (general) (routine) without abnormal findings: Secondary | ICD-10-CM

## 2022-09-18 DIAGNOSIS — Z3009 Encounter for other general counseling and advice on contraception: Secondary | ICD-10-CM | POA: Diagnosis not present

## 2022-09-18 DIAGNOSIS — F1910 Other psychoactive substance abuse, uncomplicated: Secondary | ICD-10-CM

## 2022-09-18 DIAGNOSIS — F17200 Nicotine dependence, unspecified, uncomplicated: Secondary | ICD-10-CM

## 2022-09-18 LAB — WET PREP FOR TRICH, YEAST, CLUE
Trichomonas Exam: NEGATIVE
Yeast Exam: NEGATIVE

## 2022-09-18 LAB — HM HEPATITIS C SCREENING LAB: HM Hepatitis Screen: NEGATIVE

## 2022-09-18 LAB — PREGNANCY, URINE: Preg Test, Ur: POSITIVE — AB

## 2022-09-18 LAB — HEMOGLOBIN, FINGERSTICK: Hemoglobin: 12.8 g/dL (ref 11.1–15.9)

## 2022-09-18 LAB — HM HIV SCREENING LAB: HM HIV Screening: NEGATIVE

## 2022-09-18 MED ORDER — METRONIDAZOLE 500 MG PO TABS: 500.0000 mg | ORAL_TABLET | Freq: Two times a day (BID) | ORAL | 0 refills | Status: AC

## 2022-09-18 MED ORDER — MEDROXYPROGESTERONE ACETATE 150 MG/ML IM SUSP
150.0000 mg | INTRAMUSCULAR | Status: DC
Start: 2022-09-18 — End: 2022-09-18

## 2022-09-18 MED ORDER — ULIPRISTAL ACETATE 30 MG PO TABS
30.0000 mg | ORAL_TABLET | Freq: Once | ORAL | Status: DC
Start: 2022-09-18 — End: 2022-09-18

## 2022-09-18 NOTE — Addendum Note (Signed)
Addended by: Lenice Llamas on: 09/18/2022 05:05 PM   Modules accepted: Orders

## 2022-09-18 NOTE — Progress Notes (Addendum)
Pt is here for PE, STD check and BC.  Wet mount results reviewed by Provider.  Pt notified of positive urine pregnancy test.  Berdie Ogren, RN
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
## 2022-09-25 ENCOUNTER — Emergency Department
Admission: EM | Admit: 2022-09-25 | Discharge: 2022-09-25 | Disposition: A | Discharge status: Home / Self Care | Payer: Medicaid Other | Attending: Emergency Medicine | Admitting: Emergency Medicine

## 2022-09-25 ENCOUNTER — Other Ambulatory Visit: Payer: Self-pay

## 2022-09-25 ENCOUNTER — Emergency Department: Payer: Medicaid Other

## 2022-09-25 DIAGNOSIS — Z3A00 Weeks of gestation of pregnancy not specified: Secondary | ICD-10-CM | POA: Insufficient documentation

## 2022-09-25 DIAGNOSIS — N939 Abnormal uterine and vaginal bleeding, unspecified: Secondary | ICD-10-CM

## 2022-09-25 DIAGNOSIS — O209 Hemorrhage in early pregnancy, unspecified: Secondary | ICD-10-CM | POA: Insufficient documentation

## 2022-09-25 LAB — URINE DRUG SCREEN, QUALITATIVE (ARMC ONLY)
Amphetamines, Ur Screen: POSITIVE — AB
Barbiturates, Ur Screen: NOT DETECTED
Benzodiazepine, Ur Scrn: NOT DETECTED
Cannabinoid 50 Ng, Ur ~~LOC~~: POSITIVE — AB
Cocaine Metabolite,Ur ~~LOC~~: POSITIVE — AB
MDMA (Ecstasy)Ur Screen: NOT DETECTED
Methadone Scn, Ur: NOT DETECTED
Opiate, Ur Screen: NOT DETECTED
Phencyclidine (PCP) Ur S: NOT DETECTED
Tricyclic, Ur Screen: NOT DETECTED

## 2022-09-25 LAB — CBC WITH DIFFERENTIAL/PLATELET
Abs Immature Granulocytes: 0.02 10*3/uL (ref 0.00–0.07)
Basophils Absolute: 0.1 10*3/uL (ref 0.0–0.1)
Basophils Relative: 1 %
Eosinophils Absolute: 0.1 10*3/uL (ref 0.0–0.5)
Eosinophils Relative: 2 %
HCT: 41.1 % (ref 36.0–46.0)
Hemoglobin: 13.1 g/dL (ref 12.0–15.0)
Immature Granulocytes: 0 %
Lymphocytes Relative: 46 %
Lymphs Abs: 3.5 10*3/uL (ref 0.7–4.0)
MCH: 28.1 pg (ref 26.0–34.0)
MCHC: 31.9 g/dL (ref 30.0–36.0)
MCV: 88.2 fL (ref 80.0–100.0)
Monocytes Absolute: 0.5 10*3/uL (ref 0.1–1.0)
Monocytes Relative: 7 %
Neutro Abs: 3.3 10*3/uL (ref 1.7–7.7)
Neutrophils Relative %: 44 %
Platelets: 401 10*3/uL — ABNORMAL HIGH (ref 150–400)
RBC: 4.66 MIL/uL (ref 3.87–5.11)
RDW: 16 % — ABNORMAL HIGH (ref 11.5–15.5)
WBC: 7.6 10*3/uL (ref 4.0–10.5)
nRBC: 0 % (ref 0.0–0.2)

## 2022-09-25 LAB — URINALYSIS, ROUTINE W REFLEX MICROSCOPIC
Bacteria, UA: NONE SEEN
Bilirubin Urine: NEGATIVE
Glucose, UA: NEGATIVE mg/dL
Ketones, ur: NEGATIVE mg/dL
Leukocytes,Ua: NEGATIVE
Nitrite: NEGATIVE
Protein, ur: 100 mg/dL — AB
Specific Gravity, Urine: 1.024 (ref 1.005–1.030)
pH: 5 (ref 5.0–8.0)

## 2022-09-25 LAB — COMPREHENSIVE METABOLIC PANEL
ALT: 23 U/L (ref 0–44)
AST: 23 U/L (ref 15–41)
Albumin: 4 g/dL (ref 3.5–5.0)
Alkaline Phosphatase: 100 U/L (ref 38–126)
Anion gap: 10 (ref 5–15)
BUN: 11 mg/dL (ref 6–20)
CO2: 18 mmol/L — ABNORMAL LOW (ref 22–32)
Calcium: 9.5 mg/dL (ref 8.9–10.3)
Chloride: 108 mmol/L (ref 98–111)
Creatinine, Ser: 0.91 mg/dL (ref 0.44–1.00)
GFR, Estimated: >60 mL/min (ref >60)
Glucose, Bld: 95 mg/dL (ref 70–99)
Potassium: 4.2 mmol/L (ref 3.5–5.1)
Sodium: 136 mmol/L (ref 135–145)
Total Bilirubin: 1.2 mg/dL (ref 0.3–1.2)
Total Protein: 7.3 g/dL (ref 6.5–8.1)

## 2022-09-25 LAB — HCG, QUANTITATIVE, PREGNANCY: hCG, Beta Chain, Quant, S: 2 m[IU]/mL (ref <5)

## 2022-09-25 MED ORDER — SODIUM CHLORIDE 0.9 % IV BOLUS
1000.0000 mL | Freq: Once | INTRAVENOUS | Status: AC
  Administered 2022-09-25: 1000 mL via INTRAVENOUS

## 2022-09-25 MED ORDER — NALOXONE HCL 2 MG/2ML IJ SOSY
PREFILLED_SYRINGE | INTRAMUSCULAR | Status: AC
  Filled 2022-09-25: qty 2

## 2022-09-25 NOTE — ED Notes (Signed)
Given cup of ice per request

## 2022-09-25 NOTE — Discharge Instructions (Signed)
Your pregnancy hormone was negative.  Your ultrasound did not show any intrauterine pregnancy.  It is possible that you had a miscarriage or that your urine pregnancy test at the health department was a false positive.  You should have your pregnancy test either urine or hormone repeated in about 5 to 7 days to ensure that it is staying negative.

## 2022-09-25 NOTE — ED Provider Notes (Signed)
[facility] Provider Note    Event Date/Time   First MD Initiated Contact with Patient 09/25/22 1537     (approximate)   History   Vaginal Bleeding   HPI  Susan [name] is a 27 y.o. female G6P4 who presents because of vaginal bleeding.  Patient is about 6 weeks postpartum.  She had vaginal delivery at the end of March at [facility].  She went to the health department in follow-up on 5/9 and was found to be pregnant again.  She did not have a menstrual period following her last delivery.  Presented today because of vaginal bleeding which started 2 days ago.  She describes this as being heavier than her menstrual period.  She ran out of pads and was using a rag.  Not having to change it frequently.  She is passing small clots.  She has had some bilateral lower abdominal cramping.  Patient also endorses burning with urination which she cannot tell me how long this has been going on.  Patient did smoke marijuana prior to coming to the ED.  In triage she was noted to be diaphoretic and presyncopal and was brought back to triage room immediately.  Apparently patient does have history of syncope..       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   Involuntary commitment 08/11/2022   Polysubstance abuse (HCC) 08/10/2022   Marijuana use during pregnancy 05/19/2022   Pelvic inflammatory disease (PID) 02/22/2022   MDD (major depressive disorder), recurrent episode, severe (HCC) 12/02/2019   Family history of SIDS (sudden infant death syndrome) May 02, 2017   History of pre-eclampsia 04/06/2017   Smoker  12/07/2016   Marijuana abuse 12/07/2016   Abnormal liver function tests 09/18/2016   Chronic headaches 09/18/2016     Physical Exam  Triage Vital Signs: ED Triage Vitals [09/25/22 1537]  Enc Vitals Group     BP      Pulse      Resp      Temp      Temp src      SpO2      Weight 175 lb 3.2 oz (79.5 kg)     Height      Head Circumference      Peak Flow      Pain Score 0     Pain Loc      Pain Edu?      Excl. in GC?     Most recent vital signs: Vitals:   09/25/22 1615 09/25/22 1645  BP: 106/68 104/67  Pulse: 87 82  Resp: 17 16  SpO2: 100% 100%     General: Awake, patient is diaphoretic on well-appearing CV:  Good peripheral perfusion.  Resp:  Normal effort.  Abd:  No distention.  Abdomen is soft and nontender Neuro:             Awake, Alert, Oriented x 3  Other:  Pelvic exam with speculum shows scant bleeding in the vaginal vault no active hemorrhage, no discharge   ED Results / Procedures / Treatments  Labs (all labs ordered are listed, but only abnormal  results are displayed) Labs Reviewed  URINALYSIS, ROUTINE W REFLEX MICROSCOPIC - Abnormal; Notable for the following components:      Result Value   Color, Urine YELLOW (*)    APPearance HAZY (*)    Hgb urine dipstick LARGE (*)    Protein, ur 100 (*)    All other components within normal limits  URINE DRUG SCREEN, QUALITATIVE ([facility] ONLY) - Abnormal; Notable for the following components:   Amphetamines, Ur Screen POSITIVE (*)    Cocaine Metabolite,Ur [place] POSITIVE (*)    Cannabinoid 50 Ng, Ur [place] POSITIVE (*)    All other components within normal limits  CBC WITH DIFFERENTIAL/PLATELET - Abnormal; Notable for the following components:   RDW 16.0 (*)    Platelets 401 (*)    All other components within normal limits  COMPREHENSIVE METABOLIC PANEL - Abnormal; Notable for the following components:   CO2 18 (*)    All other components within normal limits  HCG, QUANTITATIVE, PREGNANCY     EKG  EKG reviewed  interpreted myself shows sinus rhythm with normal axis and intervals no acute ischemic changes   RADIOLOGY I reviewed and interpreted the pelvic ultrasound which is negative for intrauterine pregnancy   PROCEDURES:  Critical Care performed: No  Procedures  The patient is on the cardiac monitor to evaluate for evidence of arrhythmia and/or significant heart rate changes.   MEDICATIONS ORDERED IN ED: Medications  sodium chloride 0.9 % bolus 1,000 mL (0 mLs Intravenous Stopped 09/25/22 1810)     IMPRESSION / MDM / ASSESSMENT AND PLAN / ED COURSE  I reviewed the triage vital signs and the nursing notes.                              Patient's presentation is most consistent with acute presentation with potential threat to life or bodily function.  Differential diagnosis includes, but is not limited to, ectopic pregnancy, miscarriage, hemorrhagic shock, vasovagal episode, dehydration, sepsis  The patient is a 27 year old female G6, P4 who presents because of vaginal bleeding.  She had a precipitous vaginal delivery full-term at the end of March and is now pregnant again which she found out about a week ago.  Has had 2 days of vaginal bleeding.  She arrived in triage and was diaphoretic and presyncopal.  Initially triage nurse was concerned that she needed Narcan and brought her back to ED room immediately.  When I evaluated the patient she is diaphoretic and pale but she is maintaining adequate respirations and is alert and oriented do not feel that she clinically needs Narcan I am more suspicious for possible vasovagal episode versus hypotension in setting of blood loss.  She has been bleeding for about 2 days describes it as heavier than a menstrual period but she is not having to change her pad/rag frequently.  She does endorse some lower abdominal cramping as well as urinary symptoms.  Patient's blood pressure is soft.  IV access obtained will give a bolus of fluid.  Will send CBC CMP  urinalysis quantitative hCG and type and screen.   Patient's hemoglobin is reassuringly normal.  I did perform a pelvic exam that shows minimal bleeding.   [place] her quantitative pregnancy hormone is 2 here, which is essentially negative.  Question whether she had a miscarriage or whether the positive pregnancy test at her health department visit was false.  First trimester ultrasound does not show any IUP.  Urinalysis  has 11-20 red cells to 6-10 white cells with no bacteria, which I do not think is consistent with UTI.  On reassessment patient is feeling well with normal mental status no lightheadedness.  Suspect she had a vasovagal episode.  EKG does not have any concerning features to suggest cardiogenic syncope.  Discussed with patient that the safest thing to do is have the quantitative pregnancy hormone or urine pregnancy test repeated in several days to ensure that it is negative.      FINAL CLINICAL IMPRESSION(S) / ED DIAGNOSES   Final diagnoses:  Vaginal bleeding     Rx / DC Orders   ED Discharge Orders     None        Note:  This document was prepared using Dragon voice recognition software and may include unintentional dictation errors.   Georga Hacking, MD 09/25/22 [phone]

## 2022-09-25 NOTE — ED Triage Notes (Signed)
Arrives with family.  Patient sleepy. Sister stated patient having vaginal bleeding x 2-3 days.  Recent pregnancy, delivered Easter Sunday.  States she had a positive pregnancy test at PCP.  Paitent with history of drug abuse, states has used marijuana, denies all other drugs.  Patient is Awake and alert, and oriented.  Diaphoretic.

## 2022-10-02 ENCOUNTER — Ambulatory Visit: Payer: Medicaid Other

## 2022-10-23 NOTE — Addendum Note (Signed)
Addended by: Lenice Llamas on: 10/23/2022 09:02 AM   Modules accepted: Level of Service

## 2023-01-24 ENCOUNTER — Emergency Department
Admission: EM | Admit: 2023-01-24 | Discharge: 2023-01-25 | Disposition: A | Discharge status: Home / Self Care | Payer: MEDICAID | Attending: Emergency Medicine | Admitting: Emergency Medicine

## 2023-01-24 ENCOUNTER — Other Ambulatory Visit: Payer: Self-pay

## 2023-01-24 DIAGNOSIS — N939 Abnormal uterine and vaginal bleeding, unspecified: Secondary | ICD-10-CM | POA: Diagnosis not present

## 2023-01-24 DIAGNOSIS — R102 Pelvic and perineal pain: Secondary | ICD-10-CM | POA: Insufficient documentation

## 2023-01-24 DIAGNOSIS — F1910 Other psychoactive substance abuse, uncomplicated: Secondary | ICD-10-CM | POA: Insufficient documentation

## 2023-01-24 DIAGNOSIS — Z79899 Other long term (current) drug therapy: Secondary | ICD-10-CM | POA: Diagnosis not present

## 2023-01-24 DIAGNOSIS — N898 Other specified noninflammatory disorders of vagina: Secondary | ICD-10-CM | POA: Insufficient documentation

## 2023-01-24 LAB — COMPREHENSIVE METABOLIC PANEL
ALT: 44 U/L (ref 0–44)
AST: 18 U/L (ref 15–41)
Albumin: 4.5 g/dL (ref 3.5–5.0)
Alkaline Phosphatase: 113 U/L (ref 38–126)
Anion gap: 11 (ref 5–15)
BUN: 7 mg/dL (ref 6–20)
CO2: 24 mmol/L (ref 22–32)
Calcium: 9.4 mg/dL (ref 8.9–10.3)
Chloride: 103 mmol/L (ref 98–111)
Creatinine, Ser: 0.63 mg/dL (ref 0.44–1.00)
GFR, Estimated: >60 mL/min (ref >60)
Glucose, Bld: 107 mg/dL — ABNORMAL HIGH (ref 70–99)
Potassium: 3.5 mmol/L (ref 3.5–5.1)
Sodium: 138 mmol/L (ref 135–145)
Total Bilirubin: 1.5 mg/dL — ABNORMAL HIGH (ref 0.3–1.2)
Total Protein: 7.7 g/dL (ref 6.5–8.1)

## 2023-01-24 LAB — ETHANOL: Alcohol, Ethyl (B): <10 mg/dL (ref <10)

## 2023-01-24 LAB — CBC
HCT: 41 % (ref 36.0–46.0)
Hemoglobin: 13.2 g/dL (ref 12.0–15.0)
MCH: 28.6 pg (ref 26.0–34.0)
MCHC: 32.2 g/dL (ref 30.0–36.0)
MCV: 88.7 fL (ref 80.0–100.0)
Platelets: 345 10*3/uL (ref 150–400)
RBC: 4.62 MIL/uL (ref 3.87–5.11)
RDW: 15.6 % — ABNORMAL HIGH (ref 11.5–15.5)
WBC: 10.2 10*3/uL (ref 4.0–10.5)
nRBC: 0 % (ref 0.0–0.2)

## 2023-01-24 LAB — LIPASE, BLOOD: Lipase: 22 U/L (ref 11–51)

## 2023-01-24 NOTE — ED Triage Notes (Signed)
Pt presents to ER accompanied by sister. Pt reports lower abdominal pain and cramping. Pt reports she has been having vaginal bleeding. Pt reports she feels her legs "like noodles" numbing. Pt reports she has been hearing voices and has not been taking her schizophrenia medications. Pt talks in complete sentences no respiratory distress noted.
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
## 2023-01-24 NOTE — ED Notes (Signed)
Pt reports she has thought about jumping in front of a car

## 2023-01-25 LAB — URINALYSIS, W/ REFLEX TO CULTURE (INFECTION SUSPECTED)
Bilirubin Urine: NEGATIVE
Glucose, UA: NEGATIVE mg/dL
Ketones, ur: NEGATIVE mg/dL
Nitrite: NEGATIVE
Protein, ur: NEGATIVE mg/dL
Specific Gravity, Urine: 1.023 (ref 1.005–1.030)
pH: 6 (ref 5.0–8.0)

## 2023-01-25 LAB — URINE DRUG SCREEN, QUALITATIVE (ARMC ONLY)
Amphetamines, Ur Screen: POSITIVE — AB
Barbiturates, Ur Screen: NOT DETECTED
Benzodiazepine, Ur Scrn: NOT DETECTED
Cannabinoid 50 Ng, Ur ~~LOC~~: POSITIVE — AB
Cocaine Metabolite,Ur ~~LOC~~: POSITIVE — AB
MDMA (Ecstasy)Ur Screen: NOT DETECTED
Methadone Scn, Ur: NOT DETECTED
Opiate, Ur Screen: NOT DETECTED
Phencyclidine (PCP) Ur S: NOT DETECTED
Tricyclic, Ur Screen: NOT DETECTED

## 2023-01-25 LAB — HIV ANTIBODY (ROUTINE TESTING W REFLEX): HIV Screen 4th Generation wRfx: NONREACTIVE

## 2023-01-25 LAB — PREGNANCY, URINE: Preg Test, Ur: NEGATIVE

## 2023-01-25 LAB — RPR: RPR Ser Ql: NONREACTIVE

## 2023-01-25 MED ORDER — AZITHROMYCIN 500 MG PO TABS
1000.0000 mg | ORAL_TABLET | Freq: Once | ORAL | Status: AC
  Administered 2023-01-25: 1000 mg via ORAL
  Filled 2023-01-25: qty 2

## 2023-01-25 MED ORDER — METRONIDAZOLE 500 MG PO TABS
2000.0000 mg | ORAL_TABLET | Freq: Once | ORAL | Status: AC
  Administered 2023-01-25: 2000 mg via ORAL
  Filled 2023-01-25: qty 4

## 2023-01-25 MED ORDER — CEFTRIAXONE SODIUM 500 MG IJ SOLR
500.0000 mg | Freq: Once | INTRAMUSCULAR | Status: AC
  Administered 2023-01-25: 500 mg via INTRAMUSCULAR
  Filled 2023-01-25: qty 500

## 2023-01-25 NOTE — Discharge Instructions (Signed)
Your pregnancy test is negative. If your HIV test result is positive, you will receive a phone call to arrange follow up evaluation and treatment.

## 2023-01-25 NOTE — ED Provider Notes (Signed)
[facility] Provider Note    Event Date/Time   First MD Initiated Contact with Patient 01/24/23 2356     (approximate)   History   Chief Complaint: Psychiatric Evaluation and Abdominal Pain   HPI  Susan [name] is a 27 y.o. female with a history of ADHD, depression, polysubstance abuse who comes to the ED complaining of pelvic pain with cramping, vaginal spotting, and thick vaginal discharge.  Denies abdominal pain fevers or chills, no back pain or dysuria.  Does report being sexually active, including with someone who she knows to be HIV positive.     Physical Exam   Triage Vital Signs: ED Triage Vitals  Encounter Vitals Group     BP 01/24/23 2040 (!) 148/97     Systolic BP Percentile --      Diastolic BP Percentile --      Pulse Rate 01/24/23 2040 (!) 120     Resp 01/24/23 2040 16     Temp 01/24/23 2040 98.7 F (37.1 C)     Temp Source 01/24/23 2040 Oral     SpO2 01/24/23 2040 100 %     Weight 01/24/23 2042 150 lb (68 kg)     Height 01/24/23 2042 5\' 6"  (1.676 m)     Head Circumference --      Peak Flow --      Pain Score 01/24/23 2042 8     Pain Loc --      Pain Education --      Exclude from Growth Chart --     Most recent vital signs: Vitals:   01/24/23 2040  BP: (!) 148/97  Pulse: (!) 120  Resp: 16  Temp: 98.7 F (37.1 C)  SpO2: 100%    General: Awake, no distress.  CV:  Good peripheral perfusion.  Regular rate and rhythm Resp:  Normal effort.  Abd:  No distention.  Soft nontender Other:  Moist oral mucosa.  Nontoxic.  Lucid with linear thought process, normal cadence, normal language   ED Results / Procedures / Treatments   Labs (all labs ordered are listed, but only abnormal results are displayed) Labs Reviewed  COMPREHENSIVE METABOLIC PANEL - Abnormal; Notable for the following components:      Result Value   Glucose, Bld 107 (*)    Total Bilirubin 1.5 (*)    All other components within normal limits  CBC -  Abnormal; Notable for the following components:   RDW 15.6 (*)    All other components within normal limits  URINE DRUG SCREEN, QUALITATIVE ([facility] ONLY) - Abnormal; Notable for the following components:   Amphetamines, Ur Screen POSITIVE (*)    Cocaine Metabolite,Ur [place] POSITIVE (*)    Cannabinoid 50 Ng, Ur [place] POSITIVE (*)    All other components within normal limits  URINALYSIS, W/ REFLEX TO CULTURE (INFECTION SUSPECTED) - Abnormal; Notable for the following components:   Color, Urine YELLOW (*)    APPearance HAZY (*)    Hgb urine dipstick LARGE (*)    Leukocytes,Ua SMALL (*)    Bacteria, UA RARE (*)    All other components within normal limits  LIPASE, BLOOD  ETHANOL  PREGNANCY, URINE  RPR  HIV ANTIBODY (ROUTINE TESTING W REFLEX)     EKG    RADIOLOGY    PROCEDURES:  Procedures   MEDICATIONS ORDERED IN ED: Medications  azithromycin (ZITHROMAX) tablet 1,000 mg (1,000 mg Oral Given 01/25/23 0133)  cefTRIAXone (ROCEPHIN) injection 500 mg (500 mg  Intramuscular Given 01/25/23 0136)  metroNIDAZOLE (FLAGYL) tablet 2,000 mg (2,000 mg Oral Given 01/25/23 0133)     IMPRESSION / MDM / ASSESSMENT AND PLAN / ED COURSE  I reviewed the triage vital signs and the nursing notes.  DDx: UTI, gonorrhea, chlamydia, syphilis, HIV, pregnancy.  Doubt TOA or PID.  Patient's presentation is most consistent with acute presentation with potential threat to life or bodily function.  Patient is nontoxic, comes ED essentially requesting STI evaluation.  Will treat empirically for GC chlamydia trichomoniasis.  Patient is agreeable with RPR and HIV testing which can be followed up by ID if positive.  Will obtain UA and pregnancy test.   ----------------------------------------- 2:46 AM on 01/25/2023 ----------------------------------------- UA unremarkable, pregnancy negative.  Stable for discharge      FINAL CLINICAL IMPRESSION(S) / ED DIAGNOSES   Final diagnoses:  Pelvic pain   Polysubstance abuse   Rx / DC Orders   ED Discharge Orders          Ordered    Ambulatory Referral to Primary Care (Establish Care)        01/25/23 0246             Note:  This document was prepared using Dragon voice recognition software and may include unintentional dictation errors.   Sharman Cheek, MD 01/25/23 [phone]

## 2023-03-20 ENCOUNTER — Other Ambulatory Visit: Payer: Self-pay

## 2023-03-20 DIAGNOSIS — N3000 Acute cystitis without hematuria: Secondary | ICD-10-CM | POA: Diagnosis not present

## 2023-03-20 DIAGNOSIS — N898 Other specified noninflammatory disorders of vagina: Secondary | ICD-10-CM | POA: Diagnosis present

## 2023-03-20 DIAGNOSIS — N739 Female pelvic inflammatory disease, unspecified: Secondary | ICD-10-CM | POA: Diagnosis not present

## 2023-03-20 LAB — URINALYSIS, ROUTINE W REFLEX MICROSCOPIC
Bacteria, UA: NONE SEEN
Bilirubin Urine: NEGATIVE
Glucose, UA: NEGATIVE mg/dL
Ketones, ur: NEGATIVE mg/dL
Nitrite: NEGATIVE
Protein, ur: NEGATIVE mg/dL
Specific Gravity, Urine: 1.027 (ref 1.005–1.030)
pH: 5 (ref 5.0–8.0)

## 2023-03-20 LAB — POC URINE PREG, ED: Preg Test, Ur: NEGATIVE

## 2023-03-20 NOTE — ED Triage Notes (Addendum)
Pt to ed from home via POV for "a tampon stuck in my vagina". Mother at bedside bc she states pt is special needs. Pt is alert and oriented and can answer all questions as asked appropriately. Pt then states "I think have a UTI too bc it burns to pee".

## 2023-03-21 ENCOUNTER — Emergency Department
Admission: EM | Admit: 2023-03-21 | Discharge: 2023-03-21 | Disposition: A | Discharge status: Home / Self Care | Payer: MEDICAID | Attending: Emergency Medicine | Admitting: Emergency Medicine

## 2023-03-21 DIAGNOSIS — N3000 Acute cystitis without hematuria: Secondary | ICD-10-CM

## 2023-03-21 DIAGNOSIS — N739 Female pelvic inflammatory disease, unspecified: Secondary | ICD-10-CM

## 2023-03-21 LAB — WET PREP, GENITAL
Sperm: NONE SEEN
Trich, Wet Prep: NONE SEEN
WBC, Wet Prep HPF POC: >=10 — AB (ref <10)
Yeast Wet Prep HPF POC: NONE SEEN

## 2023-03-21 LAB — CHLAMYDIA/NGC RT PCR (ARMC ONLY)
Chlamydia Tr: NOT DETECTED
N gonorrhoeae: NOT DETECTED

## 2023-03-21 MED ORDER — METRONIDAZOLE 500 MG PO TABS
500.0000 mg | ORAL_TABLET | Freq: Once | ORAL | Status: AC
  Administered 2023-03-21: 500 mg via ORAL
  Filled 2023-03-21: qty 1

## 2023-03-21 MED ORDER — DOXYCYCLINE HYCLATE 100 MG PO TABS
100.0000 mg | ORAL_TABLET | Freq: Once | ORAL | Status: AC
  Administered 2023-03-21: 100 mg via ORAL
  Filled 2023-03-21: qty 1

## 2023-03-21 MED ORDER — CEPHALEXIN 500 MG PO CAPS: 500.0000 mg | ORAL_CAPSULE | Freq: Two times a day (BID) | ORAL | 0 refills | Status: AC

## 2023-03-21 MED ORDER — METRONIDAZOLE 500 MG PO TABS: 500.0000 mg | ORAL_TABLET | Freq: Two times a day (BID) | ORAL | 0 refills | Status: AC

## 2023-03-21 MED ORDER — CEFTRIAXONE SODIUM 1 G IJ SOLR
1.0000 g | Freq: Once | INTRAMUSCULAR | Status: AC
  Administered 2023-03-21: 1 g via INTRAMUSCULAR
  Filled 2023-03-21 (×2): qty 10

## 2023-03-21 MED ORDER — DOXYCYCLINE HYCLATE 100 MG PO TABS: 100.0000 mg | ORAL_TABLET | Freq: Two times a day (BID) | ORAL | 0 refills | Status: AC

## 2023-03-21 NOTE — ED Notes (Signed)
RN at bedside for pelvic exam.
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
## 2023-03-21 NOTE — ED Provider Notes (Signed)
[facility] Provider Note    Event Date/Time   First MD Initiated Contact with Patient 03/21/23 0132     (approximate)   History   Foreign Body in Vagina (Tampon x4 days)   HPI  Susan [name] is a 27 y.o. female who presents to the ED for evaluation of Foreign Body in Vagina (Tampon x4 days)   Patient presents to the ED alongside her mother for evaluation of a possible vaginal foreign body.  She thinks there might be a tampon that been present for up to 1 week.  Reports chills and cramping without abdominal pain, emesis or stool changes.  Also reports dysuria for the past 3 to 4 days.  Reports some vaginal bleeding and discharge   Physical Exam   Triage Vital Signs: ED Triage Vitals  Encounter Vitals Group     BP 03/20/23 2100 134/83     Systolic BP Percentile --      Diastolic BP Percentile --      Pulse Rate 03/20/23 2100 (!) 120     Resp 03/20/23 2100 14     Temp 03/20/23 2100 97.8 F (36.6 C)     Temp Source 03/20/23 2100 Oral     SpO2 03/20/23 2100 98 %     Weight --      Height 03/20/23 2101 5\' 6"  (1.676 m)     Head Circumference --      Peak Flow --      Pain Score 03/20/23 2100 0     Pain Loc --      Pain Education --      Exclude from Growth Chart --     Most recent vital signs: Vitals:   03/20/23 2100 03/21/23 0206  BP: 134/83 97/67  Pulse: (!) 120 98  Resp: 14 16  Temp: 97.8 F (36.6 C)   SpO2: 98% 99%    General: Awake, no distress.  CV:  Good peripheral perfusion.  Resp:  Normal effort.  Abd:  No distention.  Mild suprapubic tenderness MSK:  No deformity noted.  Neuro:  No focal deficits appreciated. Other:  Chaperoned pelvic exam with no vaginal foreign body no tampon, no bleeding or signs of trauma to the vagina.  She does have a closed cervical os and significant amount of white exudative discharge coming from the cervical os.  Tender cervix concerning for PID   ED Results / Procedures / Treatments    Labs (all labs ordered are listed, but only abnormal results are displayed) Labs Reviewed  WET PREP, GENITAL - Abnormal; Notable for the following components:      Result Value   Clue Cells Wet Prep HPF POC PRESENT (*)    WBC, Wet Prep HPF POC >=10 (*)    All other components within normal limits  URINALYSIS, ROUTINE W REFLEX MICROSCOPIC - Abnormal; Notable for the following components:   Color, Urine YELLOW (*)    APPearance HAZY (*)    Hgb urine dipstick SMALL (*)    Leukocytes,Ua LARGE (*)    All other components within normal limits  POC URINE PREG, ED - Normal  URINE CULTURE  CHLAMYDIA/NGC RT PCR ([facility] ONLY)              EKG   RADIOLOGY   Official radiology report(s): No results found.  PROCEDURES and INTERVENTIONS:  Procedures  Medications  cefTRIAXone (ROCEPHIN) injection 1 g (1 g Intramuscular Given 03/21/23 0226)  doxycycline (VIBRA-TABS) tablet 100 mg (100  mg Oral Given 03/21/23 0226)  metroNIDAZOLE (FLAGYL) tablet 500 mg (500 mg Oral Given 03/21/23 0226)     IMPRESSION / MDM / ASSESSMENT AND PLAN / ED COURSE  I reviewed the triage vital signs and the nursing notes.  Differential diagnosis includes, but is not limited to, PID, toxic shock syndrome or sepsis, acute cystitis, retained foreign body  {Patient presents with symptoms of an acute illness or injury that is potentially life-threatening.  Patient presents with concerns of a retained vaginal tampon, without evidence of such, but does have signs of acute cystitis and possibly PID suitable for trial of outpatient management.  Urine is infectious alongside her symptoms, and her vaginal discharge, cervical tenderness and overall clinical picture is concerning for PID.  Her abdominal exam is fairly reassuring and she is not interested in staying for blood work.  We will cover broadly with doxycycline and metronidazole alongside cephalosporin to cover for UTI.  She did receive intramuscular ceftriaxone in the  ED.      FINAL CLINICAL IMPRESSION(S) / ED DIAGNOSES   Final diagnoses:  Acute cystitis without hematuria  Pelvic inflammatory disease     Rx / DC Orders   ED Discharge Orders          Ordered    cephALEXin (KEFLEX) 500 MG capsule  2 times daily        03/21/23 0234    metroNIDAZOLE (FLAGYL) 500 MG tablet  2 times daily        03/21/23 0234    doxycycline (VIBRA-TABS) 100 MG tablet  2 times daily        03/21/23 0234             Note:  This document was prepared using Dragon voice recognition software and may include unintentional dictation errors.   Delton [place], MD 03/21/23 [phone]

## 2023-03-21 NOTE — ED Notes (Signed)
Patient discharged at this time. Ambulated to lobby with independent and steady gait. Breathing unlabored speaking in full sentences. Verbalized understanding of all discharge, follow up, and medication teaching. Discharged homed with all belongings.   

## 2023-03-21 NOTE — Discharge Instructions (Signed)
You are being discharged with 3 separate antibiotics that will cover for a urinary infection and a pelvic infection.  All 3 of these antibiotics you will take twice per day for 7 days total.  You can take all 3 at the same time, 2 times per day.   Finish all prescriptions  Return to the ED with any worsening symptoms

## 2023-03-22 LAB — URINE CULTURE: Culture: >=100000 — AB

## 2023-07-13 ENCOUNTER — Encounter: Payer: Self-pay | Admitting: Emergency Medicine

## 2023-07-13 ENCOUNTER — Other Ambulatory Visit: Payer: Self-pay

## 2023-07-13 DIAGNOSIS — F1510 Other stimulant abuse, uncomplicated: Secondary | ICD-10-CM | POA: Diagnosis not present

## 2023-07-13 DIAGNOSIS — O99321 Drug use complicating pregnancy, first trimester: Secondary | ICD-10-CM | POA: Insufficient documentation

## 2023-07-13 DIAGNOSIS — O26891 Other specified pregnancy related conditions, first trimester: Secondary | ICD-10-CM | POA: Diagnosis present

## 2023-07-13 DIAGNOSIS — O2311 Infections of bladder in pregnancy, first trimester: Secondary | ICD-10-CM | POA: Insufficient documentation

## 2023-07-13 DIAGNOSIS — F1210 Cannabis abuse, uncomplicated: Secondary | ICD-10-CM | POA: Diagnosis not present

## 2023-07-13 DIAGNOSIS — F1410 Cocaine abuse, uncomplicated: Secondary | ICD-10-CM | POA: Insufficient documentation

## 2023-07-13 DIAGNOSIS — Z3A13 13 weeks gestation of pregnancy: Secondary | ICD-10-CM | POA: Diagnosis not present

## 2023-07-13 LAB — CBC WITH DIFFERENTIAL/PLATELET
Abs Immature Granulocytes: 0.09 10*3/uL — ABNORMAL HIGH (ref 0.00–0.07)
Basophils Absolute: 0.1 10*3/uL (ref 0.0–0.1)
Basophils Relative: 0 %
Eosinophils Absolute: 0.2 10*3/uL (ref 0.0–0.5)
Eosinophils Relative: 1 %
HCT: 36 % (ref 36.0–46.0)
Hemoglobin: 12.4 g/dL (ref 12.0–15.0)
Immature Granulocytes: 0 %
Lymphocytes Relative: 12 %
Lymphs Abs: 2.7 10*3/uL (ref 0.7–4.0)
MCH: 31.5 pg (ref 26.0–34.0)
MCHC: 34.4 g/dL (ref 30.0–36.0)
MCV: 91.4 fL (ref 80.0–100.0)
Monocytes Absolute: 1.1 10*3/uL — ABNORMAL HIGH (ref 0.1–1.0)
Monocytes Relative: 5 %
Neutro Abs: 17.9 10*3/uL — ABNORMAL HIGH (ref 1.7–7.7)
Neutrophils Relative %: 82 %
Platelets: 262 10*3/uL (ref 150–400)
RBC: 3.94 MIL/uL (ref 3.87–5.11)
RDW: 14.3 % (ref 11.5–15.5)
WBC: 22 10*3/uL — ABNORMAL HIGH (ref 4.0–10.5)
nRBC: 0 % (ref 0.0–0.2)

## 2023-07-13 LAB — URINALYSIS, ROUTINE W REFLEX MICROSCOPIC
Bilirubin Urine: NEGATIVE
Glucose, UA: NEGATIVE mg/dL
Ketones, ur: NEGATIVE mg/dL
Nitrite: NEGATIVE
Protein, ur: NEGATIVE mg/dL
RBC / HPF: >50 RBC/hpf (ref 0–5)
Specific Gravity, Urine: 1.024 (ref 1.005–1.030)
pH: 5 (ref 5.0–8.0)

## 2023-07-13 LAB — BASIC METABOLIC PANEL
Anion gap: 12 (ref 5–15)
BUN: 9 mg/dL (ref 6–20)
CO2: 20 mmol/L — ABNORMAL LOW (ref 22–32)
Calcium: 8.9 mg/dL (ref 8.9–10.3)
Chloride: 101 mmol/L (ref 98–111)
Creatinine, Ser: 0.46 mg/dL (ref 0.44–1.00)
GFR, Estimated: >60 mL/min (ref >60)
Glucose, Bld: 108 mg/dL — ABNORMAL HIGH (ref 70–99)
Potassium: 3.3 mmol/L — ABNORMAL LOW (ref 3.5–5.1)
Sodium: 133 mmol/L — ABNORMAL LOW (ref 135–145)

## 2023-07-13 LAB — URINE DRUG SCREEN, QUALITATIVE (ARMC ONLY)
Amphetamines, Ur Screen: POSITIVE — AB
Barbiturates, Ur Screen: NOT DETECTED
Benzodiazepine, Ur Scrn: NOT DETECTED
Cannabinoid 50 Ng, Ur ~~LOC~~: POSITIVE — AB
Cocaine Metabolite,Ur ~~LOC~~: POSITIVE — AB
MDMA (Ecstasy)Ur Screen: NOT DETECTED
Methadone Scn, Ur: NOT DETECTED
Opiate, Ur Screen: NOT DETECTED
Phencyclidine (PCP) Ur S: NOT DETECTED
Tricyclic, Ur Screen: NOT DETECTED

## 2023-07-13 MED ORDER — SODIUM CHLORIDE 0.9 % IV BOLUS
1000.0000 mL | Freq: Once | INTRAVENOUS | Status: AC
  Administered 2023-07-14: 1000 mL via INTRAVENOUS

## 2023-07-13 NOTE — ED Provider Triage Note (Addendum)
 Emergency Medicine Provider Triage Evaluation Note  Susan [name] , a 28 y.o. female  was evaluated in triage.  Pt complains of vaginal discharge, pregnancy of unknown weeks. Dysuria, lower abdominal pain. Patient states using drugs and having a withdrawal. Last time consumed drugs; 2 dasy ago. Review of Systems  Positive:  Negative:   Physical Exam  There were no vitals taken for this visit. Gen:   Awake, no distress   Resp:  Normal effort  MSK:   Moves extremities without difficulty  Other:    Medical Decision Making  Medically screening exam initiated at 10:59 PM.  Appropriate orders placed.  Susan [name] was informed that the remainder of the evaluation will be completed by another provider, this initial triage assessment does not replace that evaluation, and the importance of remaining in the ED until their evaluation is complete.   Patient has a WBC : 22.00 possible sepsis, cocaine withdrawal ?. Miscarriage? Gladys Damme, PA-C 07/13/23 2300    Gladys Damme, PA-C 07/13/23 2323

## 2023-07-13 NOTE — ED Triage Notes (Signed)
 Pt to triage via w/c with no distress noted; pt st mid lower abd pain since yesterday with vag bleeding; X3K4; +home pregnancy test; pt is sleepy, not wanting to answer questions, diaphoretic; upon taking pt to [facility], pt admits drug use (ICE) and hasn't used in 2 days, concerned she is going thru withdrawals

## 2023-07-14 ENCOUNTER — Emergency Department
Admission: EM | Admit: 2023-07-14 | Discharge: 2023-07-14 | Disposition: A | Discharge status: Home / Self Care | Payer: MEDICAID | Attending: Emergency Medicine | Admitting: Emergency Medicine

## 2023-07-14 ENCOUNTER — Emergency Department: Payer: MEDICAID

## 2023-07-14 DIAGNOSIS — N3090 Cystitis, unspecified without hematuria: Secondary | ICD-10-CM

## 2023-07-14 DIAGNOSIS — F1910 Other psychoactive substance abuse, uncomplicated: Secondary | ICD-10-CM

## 2023-07-14 DIAGNOSIS — Z3492 Encounter for supervision of normal pregnancy, unspecified, second trimester: Secondary | ICD-10-CM

## 2023-07-14 LAB — WET PREP, GENITAL
Clue Cells Wet Prep HPF POC: NONE SEEN
Sperm: NONE SEEN
Trich, Wet Prep: NONE SEEN
WBC, Wet Prep HPF POC: <10 (ref <10)
Yeast Wet Prep HPF POC: NONE SEEN

## 2023-07-14 LAB — POC URINE PREG, ED: Preg Test, Ur: POSITIVE — AB

## 2023-07-14 LAB — CHLAMYDIA/NGC RT PCR (ARMC ONLY)
Chlamydia Tr: NOT DETECTED
N gonorrhoeae: NOT DETECTED

## 2023-07-14 LAB — HCG, QUANTITATIVE, PREGNANCY: hCG, Beta Chain, Quant, S: 58034 m[IU]/mL — ABNORMAL HIGH (ref <5)

## 2023-07-14 MED ORDER — PRENATAL VITAMIN 27-0.8 MG PO TABS: 1.0000 | ORAL_TABLET | Freq: Every day | ORAL | 3 refills | Status: AC

## 2023-07-14 MED ORDER — CEPHALEXIN 500 MG PO CAPS
500.0000 mg | ORAL_CAPSULE | Freq: Two times a day (BID) | ORAL | 0 refills | Status: AC
Start: 2023-07-14 — End: ?

## 2023-07-14 MED ORDER — ACETAMINOPHEN 500 MG PO TABS
1000.0000 mg | ORAL_TABLET | Freq: Once | ORAL | Status: AC
  Administered 2023-07-14: 1000 mg via ORAL
  Filled 2023-07-14: qty 2

## 2023-07-14 MED ORDER — SODIUM CHLORIDE 0.9 % IV SOLN
1.0000 g | INTRAVENOUS | Status: AC
  Administered 2023-07-14: 1 g via INTRAVENOUS
  Filled 2023-07-14: qty 10

## 2023-07-14 NOTE — ED Provider Notes (Signed)
 [facility] Provider Note    Event Date/Time   First MD Initiated Contact with Patient 07/14/23 [phone]     (approximate)   History   Chief Complaint: Vaginal Bleeding   HPI  Susan [name] is a 28 y.o. female with a history of polysubstance abuse who comes to the ED due to lower abdominal pain with some mild vaginal bleeding.  Also has vaginal itching.  Currently pregnant, G6, P3.  Also endorses recent drug abuse.          Physical Exam   Triage Vital Signs: ED Triage Vitals  Encounter Vitals Group     BP 07/13/23 2300 123/71     Systolic BP Percentile --      Diastolic BP Percentile --      Pulse Rate 07/13/23 2300 (!) 112     Resp 07/13/23 2300 19     Temp 07/13/23 2300 98.7 F (37.1 C)     Temp Source 07/13/23 2300 Oral     SpO2 07/13/23 2300 99 %     Weight 07/13/23 2300 180 lb (81.6 kg)     Height 07/13/23 2300 5\' 6"  (1.676 m)     Head Circumference --      Peak Flow --      Pain Score 07/13/23 2308 10     Pain Loc --      Pain Education --      Exclude from Growth Chart --     Most recent vital signs: Vitals:   07/14/23 0506 07/14/23 0530  BP: 131/74 127/69  Pulse: 95 89  Resp:  16  Temp:  98.6 F (37 C)  SpO2: 100% 99%    General: Awake, no distress.  CV:  Good peripheral perfusion.  Regular rate rhythm Resp:  Normal effort.  Clear to auscultation bilaterally Abd:  No distention.  Soft with mild suprapubic tenderness Other:  Moist oral mucosa   ED Results / Procedures / Treatments   Labs (all labs ordered are listed, but only abnormal results are displayed) Labs Reviewed  BASIC METABOLIC PANEL - Abnormal; Notable for the following components:      Result Value   Sodium 133 (*)    Potassium 3.3 (*)    CO2 20 (*)    Glucose, Bld 108 (*)    All other components within normal limits  CBC WITH DIFFERENTIAL/PLATELET - Abnormal; Notable for the following components:   WBC 22.0 (*)    Neutro Abs 17.9 (*)     Monocytes Absolute 1.1 (*)    Abs Immature Granulocytes 0.09 (*)    All other components within normal limits  URINALYSIS, ROUTINE W REFLEX MICROSCOPIC - Abnormal; Notable for the following components:   Color, Urine YELLOW (*)    APPearance HAZY (*)    Hgb urine dipstick LARGE (*)    Leukocytes,Ua MODERATE (*)    Bacteria, UA RARE (*)    All other components within normal limits  HCG, QUANTITATIVE, PREGNANCY - Abnormal; Notable for the following components:   hCG, Beta Chain, Quant, S 58,034 (*)    All other components within normal limits  URINE DRUG SCREEN, QUALITATIVE ([facility] ONLY) - Abnormal; Notable for the following components:   Amphetamines, Ur Screen POSITIVE (*)    Cocaine Metabolite,Ur [place] POSITIVE (*)    Cannabinoid 50 Ng, Ur [place] POSITIVE (*)    All other components within normal limits  POC URINE PREG, ED - Abnormal; Notable for the following components:  Preg Test, Ur Positive (*)    All other components within normal limits  CHLAMYDIA/NGC RT PCR ([facility] ONLY)            WET PREP, GENITAL     EKG    RADIOLOGY Ultrasound pelvis interpreted by me, shows IUP.  Radiology report reviewed   PROCEDURES:  Procedures   MEDICATIONS ORDERED IN ED: Medications  sodium chloride 0.9 % bolus 1,000 mL (0 mLs Intravenous Stopped 07/14/23 0505)  cefTRIAXone (ROCEPHIN) 1 g in sodium chloride 0.9 % 100 mL IVPB (0 g Intravenous Stopped 07/14/23 0432)  acetaminophen (TYLENOL) tablet 1,000 mg (1,000 mg Oral Given 07/14/23 0343)     IMPRESSION / MDM / ASSESSMENT AND PLAN / ED COURSE  I reviewed the triage vital signs and the nursing notes.  DDx: Ectopic pregnancy, IUP, UTI, substance abuse  Patient's presentation is most consistent with acute presentation with potential threat to life or bodily function.  Patient presents with low abdominal pain and vaginal discharge.  Pregnancy test is positive.  Ultrasound shows IUP, no GC chlamydia negative, wet prep negative, not consistent with  TOA torsion or PID.  Will start Keflex for UTI.  UDS positive for amphetamines cocaine and THC but patient is calm.   Clinical Course as of 07/14/23 0711  Tue Jul 14, 2023  0540 Workup reassuring. Keflex for UTI. PNV. Stable for DC. [PS]    Clinical Course User Index [PS] Sharman Cheek, MD     FINAL CLINICAL IMPRESSION(S) / ED DIAGNOSES   Final diagnoses:  Second trimester pregnancy  Polysubstance abuse (HCC)  Cystitis     Rx / DC Orders   ED Discharge Orders          Ordered    cephALEXin (KEFLEX) 500 MG capsule  2 times daily        07/14/23 0539    Prenatal Vit-Fe Fumarate-FA (PRENATAL VITAMIN) 27-0.8 MG TABS  Daily        07/14/23 0539             Note:  This document was prepared using Dragon voice recognition software and may include unintentional dictation errors.   Sharman Cheek, MD 07/14/23 [phone]

## 2023-07-14 NOTE — ED Notes (Signed)
 Dr Scotty Court stated it was ok for nurse to print discharge paperwork

## 2023-07-14 NOTE — Discharge Instructions (Addendum)
 Your tests today were all okay, except the urine test showing a bladder infection. Take keflex to resolve this. Please take prenatal vitamins and follow up with obstetrics.

## 2023-07-21 ENCOUNTER — Ambulatory Visit: Payer: MEDICAID | Admitting: Physician Assistant
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
## 2024-01-09 NOTE — Discharge Summary (Signed)
 ------------------------------------------------------------------------------- Attestation signed by Anice Vermell Burnet, MD at 01/11/2024  7:27 PM I was immediately available during this encounter. I have reviewed the notes and documentation for Susan [name] and agree with the above note and plan. -------------------------------------------------------------------------------  [facility]  Obstetrical Discharge Note  Admit date: 01/09/2024  Discharge date:   01/11/2024   Primary Obstetric Provider:  [facility]  Discharge  Attending Physician: Dr. Burnet  Discharge Diagnoses:  Principal Problem: Pregnancy at [redacted]w[redacted]d   Hospital Problems:  Active Problems:   Pregnancy   Discharge Medications:   Discharge Medications     .    acetaminophen  500 MG tablet Take 1000 mg every 8 hr as needed for pain Commonly known as: TYLENOL    benzocaine -menthoL  20-0.5 % Aero Apply 1 Application topically if needed (perineal pain). Commonly known as: DERMOPLAST   hydrocortisone  2.5 % rectal cream Insert 1 Application into the rectum 2 (two) times a day. Commonly known as: ANUSOL -HC   hydrOXYzine  25 MG tablet Take 1 tablet by mouth every 6 hours as needed for anxiety with Broset score 0-1. Commonly known as: ATARAX    ibuprofen  800 MG tablet Take 800 mg every 8 hr as needed for pain Commonly known as: MOTRIN    ondansetron  4 MG disintegrating tablet Dissolve 1 tablet on the tongue every 6 hours as needed for nausea. Commonly known as: ZOFRAN  ODT   polyethylene glycol 17 gram packet Take 17 g by mouth daily. Commonly known as: MIRALAX   prenatal vitamin -iron-FA 27 mg iron- 1 mg Tab Take 1 tablet by mouth daily. Commonly known as: PRENATAL PLUS   simethicone  80 MG chewable tablet Chew 1 tablet (80 mg total) every 6 (six) hours as needed. Commonly known as: GAS X   valacyclovir 500 MG tablet Take 1 tablet (500 mg total) by mouth 2 (two) times a day. Commonly known as: VALTREX   witch  hazeL 50 % Padm topical pad Apply 1 each topically if needed. Commonly known as: TUCKS   zolpidem  5 MG tablet Take 1 tablet by mouth nightly as needed for sleep. Commonly known as: AMBIEN         Delivery Information:  [facility] at [redacted]w[redacted]d, delivered        Baby Delivery Information   Date / Time of Delivery:  01/09/2024  5:00 PM Delivered by: MEVELYN BALSAM Delivery Procedure:    Vaginal, Spontaneous Delivery Anesthesia:   Local Presentation: Vertex Sex: female  Wt: 6 lb 13 oz (3090 g) Apgars:   8  / 9   Lacerations   Episiotomy: None Perineal laceration: None Other lacerations?: No Repair suture: None         Consults: None   Edinburgh Postnatal Depression Scale:  Total: 5  Hospital Course: Susan [name] is a 28 y.o. [id number] who was admitted on 01/09/2024 for elective induction of labor. Her pregnancy was complicated by HSV, MDD, tobacco use. Labor course was uncomplicated and she delivered via NSVD with no complications. Postpartum course was uncomplicated. Patient has a history of MDD, she was seen by social work here. EPDS score of 5. She will follow up in 2 weeks for a mood check. Patient sees [facility] as well who manages her MDD. She was discharged on PPD#2. At time of discharge, she was ambulating well, voiding spontaneously, and her pain was controlled with PO pain medication. She will follow up with [facility] for 6 week postpartum visit, with one week BP check due to history of preeclampsia and 2 week mood check.  The patient was meeting all goals and will follow up at [facility] for postpartum care. The patient was discharged on PPD2.    Day of Discharge Physical Exam:   Temp:  [97.5 F (36.4 C)-98.2 F (36.8 C)] 97.5 F (36.4 C) Heart Rate:  [87-101] 101 Respirations:  [18-20] 18 BP: (130-137)/(70-83) 132/83 Physical Exam Vitals reviewed.  Constitutional:      Appearance: Normal appearance.  HENT:     Head: Normocephalic and atraumatic.    Cardiovascular:     Rate and Rhythm: Normal rate.  Pulmonary:     Effort: Pulmonary effort is normal.  Abdominal:     General: There is no distension.     Palpations: Abdomen is soft.     Tenderness: There is no abdominal tenderness.     Comments: Fundus firm, non-tender below umbilicus   Genitourinary:    Comments: Deferred  Skin:    General: Skin is warm and dry.   Neurological:     Mental Status: She is alert and oriented to person, place, and time.   Psychiatric:        Mood and Affect: Mood normal.      Birth Control Planned at Discharge: Depo-Provera  injections  Feeding method: Feeding Plans: Bottle-feeding expressed milk  DVT Prophylaxis: Not indicated  Other vaccinations:  Immunization History  Administered Date(s) Administered  . Tdap 08/15/2009, 09/04/2017, 02/22/2020, 10/26/2023    Condition at discharge:  Good  Discharge Day Services: The patient was seen by the primary team and deemed ready for discharge.  - Interpreter Services: no

## 2024-01-10 NOTE — Progress Notes (Addendum)
 [facility]  Post Partum- Vaginal Delivery Progress Note  Subjective: The patient feels tired.  Pain is well controlled with current medications.  Patient reports bleeding is light.  The patient is voiding without difficulty.  The patient is not yet ambulating well due to reported tiredness, encouraged patient to ambulate this AM. The patient is tolerating a normal diet.    Objective: Vital signs: Temp:  [98 F (36.7 C)-98.3 F (36.8 C)] 98 F (36.7 C) Heart Rate:  [71-120] 87 Respirations:  [17-22] 18 BP: (105-169)/(54-83) 134/82  Physical Exam:  General: No acute distress Abdomen: soft, nontender, non-distended, fundus firm at umbilicus Extremities: Symmetric, non-tender, no edema  Assessment/Plan:  Tyresha  Nevah Dalal is a 28 y.o. H3E6785 PPD#1.   - Feeding plans:  formula - Contraception: Depo-Provera  injections - Condition: Good - DVT prophylaxis:  Not warranted due to low risk - Discharge plan:- tomorrow   Depression w SI - H/o of two previous suicide attempts - Recently IVC'd at [facility] 12/15/[facility] for SI - Rx hydroxyzine  and zolpidem  for anxiety and sleep - Followed by [facility] - Plan for mood check 2 week PP   HSV -s/p Valtrex  Hx preE in prior pregnancy -elevated BP intra labor/second stage and immediately postpartum, NR since then - ctm BPs, 1 wk BP check pp  OUD Tobacco use disorder -currently in treatment  Seizure disorder 2/2 substance use -No Rx  Maurilio Every, MD Obstetrics & Gynecology Resident, PGY-1

## 2024-01-10 NOTE — Care Plan (Signed)
 Pt is a 28 year old G6P4 who delivered a 6lb 13oz baby girl named [place]. SW met with her alone. SW informed her of the reason for referral which is her readmission to the hospital, anxiety, depression, SI, substance use disorder. She states she was a readmission to the hospital to have the baby. The medical reason for her return in pregnancy. She states that the doctor was asking her if she was on methadone. She states she is not. She states that she has been clean from substance use for 30 days and she is at [facility] and she can take the baby with her. She states that she has help and support in the program. Mom's UDS was positive for fentanyl  which was given to her and baby's UDS was negative. SW informed her that if baby's cord comes back positive a CPS report will be made. SW asked her about her mental health during pregnancy. She states she got frustrated at times. She denies SI or HI and states she is doing mental health treatment at [facility]. She states she knows about PPD. SW informed her to talk to her therapist or doctor if she feels she is experiencing PPD sxs. She states that her other kids are with their grandmas and she is working on having her other children. She states she has everything she needs for the baby such as a car seat, crib, and clothing. She states she has a good support system. She states she has WIC and food stamps and will call to add the baby to both. SW will do a diaper train referral. SW gave her a [facility] and informed her of modivcare/medicaid transportation for appts. She denies having additional questions. SW will remain available through d/c should additional discharge needs arise.   Wells Hamilton, MSW, [place], [place] Case Manager/Social Work [phone]     01/10/24 1542  Referral Data  DCP Eval Status Initial  Referral Source Physician / NP  Referral Reason Mental Health Concerns;Substance Use / Abuse  Readmission W/I 30 days  Patient Response  Patient readmitted within 30 days  Yes  What do you think contributed to your return to the hospital? to have the baby   Assessment of Readmission Medical/Behavior Issues  Medical/Behavior Issues Chronic condition requiring treatment  Readmission W/I 30 days CM Assessment (check all that apply)  Medical/Behavioral Issues Medical condition requiring treatment  Potentially Preventable No  Patient Information  Source of Information Patient  Authorized to Consent Patient  Support System Immediate Family  Functional Support Within normal limits for age  Living Arrangements Other (Comment)  Comment: Living [facility]  OB/NICU Current Services  Payor Source [employer] [facility];Supplemental Nutrition Program (SNAP);Substance abuse treatment  Interventions  Interventions Postpartum education and mental health resources;Information and community referral(s);Education;No further interventions required  Referral To  Financial Resources General financial resources;Other (Comment)  Comment:  Personal assistant Train  Patient Goals  Where would you like to go when you DC from the hospital? Home  If your preferred DC setting is not an option, what would be your 2nd preference? Home  What are your overall goals for DC? Return to previous living  Anticipated Hospital Discharge Plan  Anticipate previous environment will meet DC needs? Yes  CM anticipates DCP is to return to previous living Yes  POTENTIAL DC PLANNING NEEDS Return to previous living situation  CM DCP Completed YES

## 2024-01-10 NOTE — Care Plan (Signed)
  Problem: Activity Goal: Ability to tolerate increased activity will improve Outcome: Progressing   Problem: Coping: Goal: Ability to cope will improve during hospitalization Outcome: Progressing   Problem: Physical Regulation: Goal: Ability to maintain adequate ventilation will improve Outcome: Progressing Goal: Risk for postpartum hemorrhage will decrease Outcome: Progressing Goal: Will remain free from infection during hospitalization Outcome: Progressing Goal: Ability to reestablish a normal urinary elimination pattern will improve Outcome: Progressing Goal: Gastrointestinal status for postoperative course will improve Outcome: Progressing   Problem: Role Relationship Goal: Ability to interact appropriately with newborn will improve prior to discharge Outcome: Progressing   Problem: Pain: Goal: General experience of comfort will improve Outcome: Progressing   Problem: Discharge Barriers: Goal: Patient's discharge needs are met prior to discharge Outcome: Progressing   Problem: Pain: Goal: Pain level will decrease Outcome: Progressing Goal: Ability to identify factors that affect pain will improve Outcome: Progressing   Problem: Medication: Goal: Satisfaction with pain management regimen will improve Outcome: Progressing

## 2024-01-18 NOTE — Telephone Encounter (Signed)
 She does not need to continue Valtrex.  Would need treatment dose if has an outbreak. Ritu Anselm Manas, MD

## 2024-01-18 NOTE — Telephone Encounter (Signed)
-----   Message from [place] M sent at 01/18/2024 12:50 PM EDT ----- Regarding: BP/PPE/PPA PHN home visit today.  BP 128/74, Denies headache visual changes or edema.  PPE 5, patient has therapist at [facility].  Needs PPA to be scheduled.  Does patient need to continue to take Valtrex?  Reviewed signs of increased  BP and when to call MD/go to ER.  Community [facility] resources provided and discussed. Thanks.
